# Patient Record
Sex: Male | Born: 1980 | Hispanic: No | Marital: Married | Smoking: Former smoker
Health system: Southern US, Community
[De-identification: ages and names within clinical notes are randomized; demographics above are authoritative.]

## PROBLEM LIST (undated history)

## (undated) DIAGNOSIS — E7800 Pure hypercholesterolemia, unspecified: Secondary | ICD-10-CM

## (undated) DIAGNOSIS — E790 Hyperuricemia without signs of inflammatory arthritis and tophaceous disease: Secondary | ICD-10-CM

## (undated) DIAGNOSIS — G43909 Migraine, unspecified, not intractable, without status migrainosus: Secondary | ICD-10-CM

## (undated) DIAGNOSIS — J189 Pneumonia, unspecified organism: Secondary | ICD-10-CM

## (undated) DIAGNOSIS — K219 Gastro-esophageal reflux disease without esophagitis: Secondary | ICD-10-CM

## (undated) DIAGNOSIS — E119 Type 2 diabetes mellitus without complications: Secondary | ICD-10-CM

## (undated) DIAGNOSIS — K859 Acute pancreatitis without necrosis or infection, unspecified: Secondary | ICD-10-CM

## (undated) DIAGNOSIS — I10 Essential (primary) hypertension: Secondary | ICD-10-CM

## (undated) DIAGNOSIS — C649 Malignant neoplasm of unspecified kidney, except renal pelvis: Secondary | ICD-10-CM

## (undated) HISTORY — PX: ADRENALECTOMY: SHX876

---

## 2009-03-31 DIAGNOSIS — C649 Malignant neoplasm of unspecified kidney, except renal pelvis: Secondary | ICD-10-CM

## 2009-03-31 HISTORY — PX: NEPHRECTOMY: SHX65

## 2009-03-31 HISTORY — DX: Malignant neoplasm of unspecified kidney, except renal pelvis: C64.9

## 2017-02-28 HISTORY — PX: UPPER GASTROINTESTINAL ENDOSCOPY: SHX188

## 2017-11-03 ENCOUNTER — Inpatient Hospital Stay (HOSPITAL_COMMUNITY)
Admission: EM | Admit: 2017-11-03 | Discharge: 2017-11-18 | DRG: 438 | Disposition: A | Discharge status: Home / Self Care | Payer: 59 | Attending: Internal Medicine | Admitting: Internal Medicine

## 2017-11-03 ENCOUNTER — Encounter (HOSPITAL_COMMUNITY): Payer: Self-pay | Admitting: Emergency Medicine

## 2017-11-03 DIAGNOSIS — D649 Anemia, unspecified: Secondary | ICD-10-CM | POA: Diagnosis present

## 2017-11-03 DIAGNOSIS — J189 Pneumonia, unspecified organism: Secondary | ICD-10-CM | POA: Diagnosis not present

## 2017-11-03 DIAGNOSIS — R7989 Other specified abnormal findings of blood chemistry: Secondary | ICD-10-CM

## 2017-11-03 DIAGNOSIS — Z79899 Other long term (current) drug therapy: Secondary | ICD-10-CM

## 2017-11-03 DIAGNOSIS — R001 Bradycardia, unspecified: Secondary | ICD-10-CM | POA: Diagnosis present

## 2017-11-03 DIAGNOSIS — Z905 Acquired absence of kidney: Secondary | ICD-10-CM

## 2017-11-03 DIAGNOSIS — N170 Acute kidney failure with tubular necrosis: Secondary | ICD-10-CM | POA: Diagnosis present

## 2017-11-03 DIAGNOSIS — E785 Hyperlipidemia, unspecified: Secondary | ICD-10-CM | POA: Diagnosis present

## 2017-11-03 DIAGNOSIS — Y95 Nosocomial condition: Secondary | ICD-10-CM | POA: Diagnosis not present

## 2017-11-03 DIAGNOSIS — K859 Acute pancreatitis without necrosis or infection, unspecified: Principal | ICD-10-CM | POA: Diagnosis present

## 2017-11-03 DIAGNOSIS — R197 Diarrhea, unspecified: Secondary | ICD-10-CM | POA: Diagnosis present

## 2017-11-03 DIAGNOSIS — Z85528 Personal history of other malignant neoplasm of kidney: Secondary | ICD-10-CM

## 2017-11-03 DIAGNOSIS — R509 Fever, unspecified: Secondary | ICD-10-CM

## 2017-11-03 DIAGNOSIS — G479 Sleep disorder, unspecified: Secondary | ICD-10-CM | POA: Diagnosis present

## 2017-11-03 DIAGNOSIS — R739 Hyperglycemia, unspecified: Secondary | ICD-10-CM | POA: Diagnosis present

## 2017-11-03 DIAGNOSIS — D6959 Other secondary thrombocytopenia: Secondary | ICD-10-CM | POA: Diagnosis present

## 2017-11-03 DIAGNOSIS — E781 Pure hyperglyceridemia: Secondary | ICD-10-CM | POA: Diagnosis present

## 2017-11-03 DIAGNOSIS — R140 Abdominal distension (gaseous): Secondary | ICD-10-CM | POA: Diagnosis not present

## 2017-11-03 DIAGNOSIS — R0902 Hypoxemia: Secondary | ICD-10-CM

## 2017-11-03 DIAGNOSIS — F1010 Alcohol abuse, uncomplicated: Secondary | ICD-10-CM | POA: Diagnosis present

## 2017-11-03 DIAGNOSIS — N179 Acute kidney failure, unspecified: Secondary | ICD-10-CM | POA: Diagnosis present

## 2017-11-03 DIAGNOSIS — E8809 Other disorders of plasma-protein metabolism, not elsewhere classified: Secondary | ICD-10-CM | POA: Diagnosis present

## 2017-11-03 DIAGNOSIS — E7800 Pure hypercholesterolemia, unspecified: Secondary | ICD-10-CM | POA: Diagnosis present

## 2017-11-03 DIAGNOSIS — E8770 Fluid overload, unspecified: Secondary | ICD-10-CM | POA: Diagnosis present

## 2017-11-03 DIAGNOSIS — R112 Nausea with vomiting, unspecified: Secondary | ICD-10-CM | POA: Diagnosis present

## 2017-11-03 DIAGNOSIS — K219 Gastro-esophageal reflux disease without esophagitis: Secondary | ICD-10-CM | POA: Diagnosis present

## 2017-11-03 DIAGNOSIS — E871 Hypo-osmolality and hyponatremia: Secondary | ICD-10-CM | POA: Diagnosis present

## 2017-11-03 DIAGNOSIS — E872 Acidosis: Secondary | ICD-10-CM | POA: Diagnosis present

## 2017-11-03 DIAGNOSIS — R945 Abnormal results of liver function studies: Secondary | ICD-10-CM

## 2017-11-03 DIAGNOSIS — E1165 Type 2 diabetes mellitus with hyperglycemia: Secondary | ICD-10-CM | POA: Diagnosis present

## 2017-11-03 DIAGNOSIS — D696 Thrombocytopenia, unspecified: Secondary | ICD-10-CM | POA: Diagnosis present

## 2017-11-03 DIAGNOSIS — I10 Essential (primary) hypertension: Secondary | ICD-10-CM | POA: Diagnosis present

## 2017-11-03 DIAGNOSIS — J90 Pleural effusion, not elsewhere classified: Secondary | ICD-10-CM | POA: Diagnosis present

## 2017-11-03 HISTORY — DX: Migraine, unspecified, not intractable, without status migrainosus: G43.909

## 2017-11-03 HISTORY — DX: Hyperuricemia without signs of inflammatory arthritis and tophaceous disease: E79.0

## 2017-11-03 HISTORY — DX: Essential (primary) hypertension: I10

## 2017-11-03 HISTORY — DX: Gastro-esophageal reflux disease without esophagitis: K21.9

## 2017-11-03 HISTORY — DX: Malignant neoplasm of unspecified kidney, except renal pelvis: C64.9

## 2017-11-03 HISTORY — DX: Pure hypercholesterolemia, unspecified: E78.00

## 2017-11-03 LAB — COMPREHENSIVE METABOLIC PANEL
ALT: 33 U/L (ref 0–44)
AST: 31 U/L (ref 15–41)
Albumin: 4.7 g/dL (ref 3.5–5.0)
Alkaline Phosphatase: 94 U/L (ref 38–126)
Anion gap: 13 (ref 5–15)
BUN: 12 mg/dL (ref 6–20)
CO2: 22 mmol/L (ref 22–32)
Calcium: 9.9 mg/dL (ref 8.9–10.3)
Chloride: 105 mmol/L (ref 98–111)
Creatinine, Ser: 1.53 mg/dL — ABNORMAL HIGH (ref 0.61–1.24)
GFR calc Af Amer: >60 mL/min (ref >60)
GFR calc non Af Amer: 57 mL/min — ABNORMAL LOW (ref >60)
Glucose, Bld: 142 mg/dL — ABNORMAL HIGH (ref 70–99)
Potassium: 4.5 mmol/L (ref 3.5–5.1)
Sodium: 140 mmol/L (ref 135–145)
Total Bilirubin: 1.4 mg/dL — ABNORMAL HIGH (ref 0.3–1.2)
Total Protein: 7.7 g/dL (ref 6.5–8.1)

## 2017-11-03 LAB — URINALYSIS, ROUTINE W REFLEX MICROSCOPIC
Bacteria, UA: NONE SEEN
Bilirubin Urine: NEGATIVE
Glucose, UA: NEGATIVE mg/dL
Hgb urine dipstick: NEGATIVE
Ketones, ur: NEGATIVE mg/dL
Leukocytes, UA: NEGATIVE
Nitrite: NEGATIVE
Protein, ur: 30 mg/dL — AB
Specific Gravity, Urine: 1.015 (ref 1.005–1.030)
pH: 5 (ref 5.0–8.0)

## 2017-11-03 LAB — CBC WITH DIFFERENTIAL/PLATELET
Abs Immature Granulocytes: 0.1 10*3/uL (ref 0.0–0.1)
Basophils Absolute: 0.1 10*3/uL (ref 0.0–0.1)
Basophils Relative: 0 %
Eosinophils Absolute: 0 10*3/uL (ref 0.0–0.7)
Eosinophils Relative: 0 %
HCT: 50.8 % (ref 39.0–52.0)
Hemoglobin: 16.8 g/dL (ref 13.0–17.0)
Immature Granulocytes: 0 %
Lymphocytes Relative: 7 %
Lymphs Abs: 1.3 10*3/uL (ref 0.7–4.0)
MCH: 28.6 pg (ref 26.0–34.0)
MCHC: 33.1 g/dL (ref 30.0–36.0)
MCV: 86.4 fL (ref 78.0–100.0)
Monocytes Absolute: 1.6 10*3/uL — ABNORMAL HIGH (ref 0.1–1.0)
Monocytes Relative: 9 %
Neutro Abs: 15.2 10*3/uL — ABNORMAL HIGH (ref 1.7–7.7)
Neutrophils Relative %: 84 %
Platelets: 259 10*3/uL (ref 150–400)
RBC: 5.88 MIL/uL — ABNORMAL HIGH (ref 4.22–5.81)
RDW: 13.2 % (ref 11.5–15.5)
WBC: 18.2 10*3/uL — ABNORMAL HIGH (ref 4.0–10.5)

## 2017-11-03 LAB — LIPASE, BLOOD: Lipase: 4842 U/L — ABNORMAL HIGH (ref 11–51)

## 2017-11-03 MED ORDER — SODIUM CHLORIDE 0.9 % IV BOLUS
1000.0000 mL | Freq: Once | INTRAVENOUS | Status: AC
  Administered 2017-11-04: 1000 mL via INTRAVENOUS

## 2017-11-03 MED ORDER — HYDROMORPHONE HCL 1 MG/ML IJ SOLN
1.0000 mg | Freq: Once | INTRAMUSCULAR | Status: AC
  Administered 2017-11-04: 1 mg via INTRAVENOUS
  Filled 2017-11-03: qty 1

## 2017-11-03 MED ORDER — ONDANSETRON HCL 4 MG/2ML IJ SOLN
4.0000 mg | Freq: Once | INTRAMUSCULAR | Status: AC
  Administered 2017-11-03: 4 mg via INTRAVENOUS
  Filled 2017-11-03: qty 2

## 2017-11-03 MED ORDER — DICYCLOMINE HCL 20 MG PO TABS
20.0000 mg | ORAL_TABLET | Freq: Once | ORAL | Status: AC
  Administered 2017-11-03: 20 mg via ORAL
  Filled 2017-11-03: qty 1

## 2017-11-03 NOTE — ED Provider Notes (Signed)
Patient placed in Quick Look pathway, seen and evaluated   Chief Complaint: Abdominal pain  HPI:   Sudden onset generalized crampy abdominal pain 10am after coffee. Worsening after lunch. Emesis x4 NBNB. Denies diarrhea, CP,SOB. Crampy pain, radiates to RUQ and RLQ.Denies urinary symptoms, no suspicious food intake.  Hx Kidney neoplasm s/p nephrectomy.   ROS: For abdominal cramping, nausea, vomiting Negative for fevers, diarrhea, constipation, urinary symptoms  Physical Exam:   Gen: No distress  Neuro: Awake and Alert  Skin: Warm    Focused Exam: Active bowel sounds, abdomen soft and nontender, Murphy sign absent, Rovsing's absent, no CVA tenderness   Initiation of care has begun. The patient has been counseled on the process, plan, and necessity for staying for the completion/evaluation, and the remainder of the medical screening examination    Debroah Baller 11/03/17 1924    Little, Wenda Overland, MD 11/04/17 [phone]

## 2017-11-03 NOTE — ED Notes (Signed)
Pt is unable to void at this time.  He has a specimen cup and instructions

## 2017-11-03 NOTE — ED Triage Notes (Signed)
Patient reports mid/upper abdominal " cramping/gas" with emesis x1 this morning , denies diarrhea , no fever or chills . Patient initially screened by PA at triage .

## 2017-11-04 ENCOUNTER — Emergency Department (HOSPITAL_COMMUNITY): Payer: 59

## 2017-11-04 ENCOUNTER — Encounter (HOSPITAL_COMMUNITY): Payer: Self-pay | Admitting: Internal Medicine

## 2017-11-04 ENCOUNTER — Other Ambulatory Visit: Payer: Self-pay

## 2017-11-04 DIAGNOSIS — E781 Pure hyperglyceridemia: Secondary | ICD-10-CM | POA: Diagnosis present

## 2017-11-04 DIAGNOSIS — Y95 Nosocomial condition: Secondary | ICD-10-CM | POA: Diagnosis not present

## 2017-11-04 DIAGNOSIS — Z85528 Personal history of other malignant neoplasm of kidney: Secondary | ICD-10-CM | POA: Diagnosis not present

## 2017-11-04 DIAGNOSIS — R0902 Hypoxemia: Secondary | ICD-10-CM | POA: Diagnosis present

## 2017-11-04 DIAGNOSIS — K858 Other acute pancreatitis without necrosis or infection: Secondary | ICD-10-CM | POA: Diagnosis not present

## 2017-11-04 DIAGNOSIS — N170 Acute kidney failure with tubular necrosis: Secondary | ICD-10-CM | POA: Diagnosis present

## 2017-11-04 DIAGNOSIS — I10 Essential (primary) hypertension: Secondary | ICD-10-CM | POA: Diagnosis present

## 2017-11-04 DIAGNOSIS — E8809 Other disorders of plasma-protein metabolism, not elsewhere classified: Secondary | ICD-10-CM | POA: Diagnosis present

## 2017-11-04 DIAGNOSIS — K8500 Idiopathic acute pancreatitis without necrosis or infection: Secondary | ICD-10-CM | POA: Diagnosis not present

## 2017-11-04 DIAGNOSIS — K219 Gastro-esophageal reflux disease without esophagitis: Secondary | ICD-10-CM | POA: Diagnosis present

## 2017-11-04 DIAGNOSIS — D72829 Elevated white blood cell count, unspecified: Secondary | ICD-10-CM | POA: Diagnosis not present

## 2017-11-04 DIAGNOSIS — D6959 Other secondary thrombocytopenia: Secondary | ICD-10-CM | POA: Diagnosis present

## 2017-11-04 DIAGNOSIS — R112 Nausea with vomiting, unspecified: Secondary | ICD-10-CM | POA: Diagnosis present

## 2017-11-04 DIAGNOSIS — Z905 Acquired absence of kidney: Secondary | ICD-10-CM | POA: Diagnosis not present

## 2017-11-04 DIAGNOSIS — R935 Abnormal findings on diagnostic imaging of other abdominal regions, including retroperitoneum: Secondary | ICD-10-CM | POA: Diagnosis not present

## 2017-11-04 DIAGNOSIS — R197 Diarrhea, unspecified: Secondary | ICD-10-CM | POA: Diagnosis present

## 2017-11-04 DIAGNOSIS — N171 Acute kidney failure with acute cortical necrosis: Secondary | ICD-10-CM | POA: Diagnosis not present

## 2017-11-04 DIAGNOSIS — D649 Anemia, unspecified: Secondary | ICD-10-CM | POA: Diagnosis present

## 2017-11-04 DIAGNOSIS — N179 Acute kidney failure, unspecified: Secondary | ICD-10-CM | POA: Diagnosis not present

## 2017-11-04 DIAGNOSIS — K859 Acute pancreatitis without necrosis or infection, unspecified: Secondary | ICD-10-CM | POA: Diagnosis present

## 2017-11-04 DIAGNOSIS — E785 Hyperlipidemia, unspecified: Secondary | ICD-10-CM | POA: Diagnosis present

## 2017-11-04 DIAGNOSIS — R945 Abnormal results of liver function studies: Secondary | ICD-10-CM | POA: Diagnosis not present

## 2017-11-04 DIAGNOSIS — E872 Acidosis: Secondary | ICD-10-CM | POA: Diagnosis present

## 2017-11-04 DIAGNOSIS — R001 Bradycardia, unspecified: Secondary | ICD-10-CM | POA: Diagnosis present

## 2017-11-04 DIAGNOSIS — R739 Hyperglycemia, unspecified: Secondary | ICD-10-CM | POA: Diagnosis present

## 2017-11-04 DIAGNOSIS — J189 Pneumonia, unspecified organism: Secondary | ICD-10-CM | POA: Diagnosis not present

## 2017-11-04 DIAGNOSIS — R5081 Fever presenting with conditions classified elsewhere: Secondary | ICD-10-CM | POA: Diagnosis not present

## 2017-11-04 DIAGNOSIS — E8770 Fluid overload, unspecified: Secondary | ICD-10-CM | POA: Diagnosis present

## 2017-11-04 DIAGNOSIS — R140 Abdominal distension (gaseous): Secondary | ICD-10-CM | POA: Diagnosis not present

## 2017-11-04 DIAGNOSIS — F1010 Alcohol abuse, uncomplicated: Secondary | ICD-10-CM | POA: Diagnosis present

## 2017-11-04 DIAGNOSIS — E7800 Pure hypercholesterolemia, unspecified: Secondary | ICD-10-CM | POA: Diagnosis present

## 2017-11-04 DIAGNOSIS — K8501 Idiopathic acute pancreatitis with uninfected necrosis: Secondary | ICD-10-CM | POA: Diagnosis not present

## 2017-11-04 DIAGNOSIS — D696 Thrombocytopenia, unspecified: Secondary | ICD-10-CM | POA: Diagnosis present

## 2017-11-04 DIAGNOSIS — K8502 Idiopathic acute pancreatitis with infected necrosis: Secondary | ICD-10-CM | POA: Diagnosis not present

## 2017-11-04 DIAGNOSIS — E871 Hypo-osmolality and hyponatremia: Secondary | ICD-10-CM | POA: Diagnosis present

## 2017-11-04 DIAGNOSIS — R509 Fever, unspecified: Secondary | ICD-10-CM | POA: Diagnosis not present

## 2017-11-04 DIAGNOSIS — J90 Pleural effusion, not elsewhere classified: Secondary | ICD-10-CM | POA: Diagnosis present

## 2017-11-04 LAB — CBC WITH DIFFERENTIAL/PLATELET
ABS IMMATURE GRANULOCYTES: 0.1 10*3/uL (ref 0.0–0.1)
BASOS ABS: 0 10*3/uL (ref 0.0–0.1)
Basophils Relative: 0 %
Eosinophils Absolute: 0 10*3/uL (ref 0.0–0.7)
Eosinophils Relative: 0 %
HCT: 48.9 % (ref 39.0–52.0)
HEMOGLOBIN: 16.3 g/dL (ref 13.0–17.0)
Immature Granulocytes: 0 %
LYMPHS PCT: 5 %
Lymphs Abs: 0.7 10*3/uL (ref 0.7–4.0)
MCH: 28.8 pg (ref 26.0–34.0)
MCHC: 33.3 g/dL (ref 30.0–36.0)
MCV: 86.4 fL (ref 78.0–100.0)
MONO ABS: 1.5 10*3/uL — AB (ref 0.1–1.0)
Monocytes Relative: 10 %
NEUTROS ABS: 13.1 10*3/uL — AB (ref 1.7–7.7)
Neutrophils Relative %: 85 %
Platelets: 204 10*3/uL (ref 150–400)
RBC: 5.66 MIL/uL (ref 4.22–5.81)
RDW: 13.2 % (ref 11.5–15.5)
WBC: 15.5 10*3/uL — ABNORMAL HIGH (ref 4.0–10.5)

## 2017-11-04 LAB — BASIC METABOLIC PANEL
ANION GAP: 14 (ref 5–15)
BUN: 14 mg/dL (ref 6–20)
CO2: 20 mmol/L — ABNORMAL LOW (ref 22–32)
Calcium: 8.9 mg/dL (ref 8.9–10.3)
Chloride: 103 mmol/L (ref 98–111)
Creatinine, Ser: 1.46 mg/dL — ABNORMAL HIGH (ref 0.61–1.24)
Glucose, Bld: 212 mg/dL — ABNORMAL HIGH (ref 70–99)
Potassium: 4.9 mmol/L (ref 3.5–5.1)
SODIUM: 137 mmol/L (ref 135–145)

## 2017-11-04 LAB — HEPATIC FUNCTION PANEL
ALT: 28 U/L (ref 0–44)
AST: 38 U/L (ref 15–41)
Albumin: 3.9 g/dL (ref 3.5–5.0)
Alkaline Phosphatase: 81 U/L (ref 38–126)
BILIRUBIN INDIRECT: 0.8 mg/dL (ref 0.3–0.9)
Bilirubin, Direct: 0.2 mg/dL (ref 0.0–0.2)
TOTAL PROTEIN: 6.7 g/dL (ref 6.5–8.1)
Total Bilirubin: 1 mg/dL (ref 0.3–1.2)

## 2017-11-04 LAB — HIV ANTIBODY (ROUTINE TESTING W REFLEX): HIV SCREEN 4TH GENERATION: NONREACTIVE

## 2017-11-04 LAB — TRIGLYCERIDES: TRIGLYCERIDES: 86 mg/dL (ref <150)

## 2017-11-04 LAB — C-REACTIVE PROTEIN: CRP: 19.4 mg/dL — ABNORMAL HIGH (ref <1.0)

## 2017-11-04 LAB — LACTIC ACID, PLASMA
LACTIC ACID, VENOUS: 4.7 mmol/L — AB (ref 0.5–1.9)
LACTIC ACID, VENOUS: 4.5 mmol/L — AB (ref 0.5–1.9)

## 2017-11-04 LAB — HEMOGLOBIN A1C
HEMOGLOBIN A1C: 5.5 % (ref 4.8–5.6)
MEAN PLASMA GLUCOSE: 111.15 mg/dL

## 2017-11-04 MED ORDER — MORPHINE SULFATE (PF) 2 MG/ML IV SOLN
2.0000 mg | INTRAVENOUS | Status: DC | PRN
  Administered 2017-11-04: 2 mg via INTRAVENOUS
  Filled 2017-11-04: qty 1

## 2017-11-04 MED ORDER — ACETAMINOPHEN 650 MG RE SUPP: 650.0000 mg | Freq: Four times a day (QID) | RECTAL | Status: DC | PRN

## 2017-11-04 MED ORDER — METOPROLOL TARTRATE 5 MG/5ML IV SOLN
5.0000 mg | Freq: Four times a day (QID) | INTRAVENOUS | Status: DC
  Administered 2017-11-04 – 2017-11-05 (×4): 5 mg via INTRAVENOUS
  Filled 2017-11-04 (×4): qty 5

## 2017-11-04 MED ORDER — HYDRALAZINE HCL 20 MG/ML IJ SOLN
5.0000 mg | INTRAMUSCULAR | Status: DC | PRN
  Administered 2017-11-04: 5 mg via INTRAVENOUS
  Filled 2017-11-04 (×2): qty 1

## 2017-11-04 MED ORDER — ACETAMINOPHEN 325 MG PO TABS
650.0000 mg | ORAL_TABLET | Freq: Four times a day (QID) | ORAL | Status: DC | PRN
  Administered 2017-11-09 – 2017-11-15 (×4): 650 mg via ORAL
  Filled 2017-11-04 (×5): qty 2

## 2017-11-04 MED ORDER — IOHEXOL 300 MG/ML  SOLN
80.0000 mL | Freq: Once | INTRAMUSCULAR | Status: AC | PRN
  Administered 2017-11-04: 80 mL via INTRAVENOUS

## 2017-11-04 MED ORDER — ONDANSETRON HCL 4 MG PO TABS
4.0000 mg | ORAL_TABLET | Freq: Four times a day (QID) | ORAL | Status: DC | PRN
  Administered 2017-11-06 – 2017-11-07 (×3): 4 mg via ORAL
  Filled 2017-11-04 (×4): qty 1

## 2017-11-04 MED ORDER — ATENOLOL 25 MG PO TABS: 25.0000 mg | ORAL_TABLET | Freq: Every day | ORAL | Status: DC

## 2017-11-04 MED ORDER — ONDANSETRON HCL 4 MG/2ML IJ SOLN
4.0000 mg | Freq: Four times a day (QID) | INTRAMUSCULAR | Status: DC | PRN
  Administered 2017-11-04 – 2017-11-07 (×10): 4 mg via INTRAVENOUS
  Filled 2017-11-04 (×13): qty 2

## 2017-11-04 MED ORDER — SODIUM CHLORIDE 0.9 % IV SOLN
INTRAVENOUS | Status: DC
  Administered 2017-11-04 – 2017-11-06 (×5): via INTRAVENOUS

## 2017-11-04 MED ORDER — HYDROMORPHONE HCL 1 MG/ML IJ SOLN
1.0000 mg | Freq: Once | INTRAMUSCULAR | Status: AC
  Administered 2017-11-04: 1 mg via INTRAVENOUS
  Filled 2017-11-04: qty 1

## 2017-11-04 MED ORDER — LACTATED RINGERS IV SOLN
INTRAVENOUS | Status: DC
  Administered 2017-11-04 (×2): via INTRAVENOUS

## 2017-11-04 MED ORDER — HYDROMORPHONE HCL 1 MG/ML IJ SOLN
1.0000 mg | INTRAMUSCULAR | Status: DC | PRN
  Administered 2017-11-04 – 2017-11-06 (×9): 1 mg via INTRAVENOUS
  Filled 2017-11-04 (×11): qty 1

## 2017-11-04 NOTE — ED Notes (Signed)
ED Provider at bedside. 

## 2017-11-04 NOTE — H&P (Signed)
History and Physical    Paul [name] IRW:[id number] DOB: 1980-08-09 DOA: 11/03/2017  PCP: Karleen Hampshire., MD  Patient coming from: Home.  Chief Complaint: Abdominal pain.  HPI: Paul [name] is a 37 y.o. male with history of hypertension, hypertriglyceridemia, history of renal cell carcinoma status post left-sided nephrectomy done almost 9 years ago in [place] presents to the ER with complaint of abdominal pain since yesterday morning.  Has been a multiple episode of nausea vomiting and some diarrhea.  Pain is mostly epigastric area.  Had last alcoholic drink 2 days ago.  States he only drinks on the weekends.  No recent medication changes.  ED Course: In the ER labs show elevated lipase around 4000 and CT abdomen shows features concerning for acute pancreatitis labs also show leukocytosis with acute renal failure.  Review of Systems: As per HPI, rest all negative.   Past Medical History:  Diagnosis Date  . Cancer [facility])     Past Surgical History:  Procedure Laterality Date  . KIDNEY SURGERY       reports that he has never smoked. He has never used smokeless tobacco. He reports that he drinks alcohol. He reports that he does not use drugs.  No Known Allergies  History reviewed. No pertinent family history.  Prior to Admission medications   Medication Sig Start Date End Date Taking? Authorizing Provider  allopurinol (ZYLOPRIM) 100 MG tablet Take 100 mg by mouth 2 (two) times daily. 10/07/17  Yes [provider]  amLODipine (NORVASC) 2.5 MG tablet Take 2.5 mg by mouth at bedtime.  10/07/17  Yes [provider]  atenolol (TENORMIN) 50 MG tablet Take 50 mg by mouth daily. 10/27/17  Yes [provider]  fenofibrate 160 MG tablet Take 160 mg by mouth daily. 10/16/17  Yes [provider]  RA KRILL OIL 500 MG CAPS Take 500 mg by mouth daily.   Yes [provider]  vitamin B-12 (CYANOCOBALAMIN) 1000 MCG tablet Take 1,000 mcg by mouth daily.   Yes  [provider]    Physical Exam: Vitals:   11/03/17 1921 11/03/17 2345 11/04/17 0045 11/04/17 0115  BP:  (!) 151/98 (!) 153/83 (!) 182/101  Pulse:  (!) 50 (!) 56 (!) 46  Resp:  16 (!) 24 (!) 25  Temp:      TempSrc:      SpO2:  98% 98% 98%  Weight: 89.8 kg (198 lb)     Height: 5\' 10"  (1.778 m)         Constitutional: Moderately built and nourished. Vitals:   11/03/17 1921 11/03/17 2345 11/04/17 0045 11/04/17 0115  BP:  (!) 151/98 (!) 153/83 (!) 182/101  Pulse:  (!) 50 (!) 56 (!) 46  Resp:  16 (!) 24 (!) 25  Temp:      TempSrc:      SpO2:  98% 98% 98%  Weight: 89.8 kg (198 lb)     Height: 5\' 10"  (1.778 m)      Eyes: Anicteric.  No pallor. ENMT: No discharge from the ears eyes nose or mouth. Neck: No mass or.  No neck rigidity. Respiratory: No rhonchi or crepitations. Cardiovascular: S1-S2 heard no murmurs appreciated. Abdomen: Epigastric tenderness no guarding or rigidity. Musculoskeletal: No edema.  No joint effusion. Skin: No rash Neurologic: Alert awake oriented to place and person.  Moves all extremities. Psychiatric: Appears normal per normal affect.   Labs on Admission: I have personally reviewed following labs and imaging studies  CBC: Recent  Labs  Lab 11/03/17 1927  WBC 18.2*  NEUTROABS 15.2*  HGB 16.8  HCT 50.8  MCV 86.4  PLT 983   Basic Metabolic Panel: Recent Labs  Lab 11/03/17 1927  NA 140  K 4.5  CL 105  CO2 22  GLUCOSE 142*  BUN 12  CREATININE 1.53*  CALCIUM 9.9   GFR: Estimated Creatinine Clearance: 75.2 mL/min (A) (by C-G formula based on SCr of 1.53 mg/dL (H)). Liver Function Tests: Recent Labs  Lab 11/03/17 1927  AST 31  ALT 33  ALKPHOS 94  BILITOT 1.4*  PROT 7.7  ALBUMIN 4.7   Recent Labs  Lab 11/03/17 1927  LIPASE 4,842*   No results for input(s): AMMONIA in the last 168 hours. Coagulation Profile: No results for input(s): INR, PROTIME in the last 168 hours. Cardiac Enzymes: No results for input(s):  CKTOTAL, CKMB, CKMBINDEX, TROPONINI in the last 168 hours. BNP (last 3 results) No results for input(s): PROBNP in the last 8760 hours. HbA1C: No results for input(s): HGBA1C in the last 72 hours. CBG: No results for input(s): GLUCAP in the last 168 hours. Lipid Profile: No results for input(s): CHOL, HDL, LDLCALC, TRIG, CHOLHDL, LDLDIRECT in the last 72 hours. Thyroid Function Tests: No results for input(s): TSH, T4TOTAL, FREET4, T3FREE, THYROIDAB in the last 72 hours. Anemia Panel: No results for input(s): VITAMINB12, FOLATE, FERRITIN, TIBC, IRON, RETICCTPCT in the last 72 hours. Urine analysis:    Component Value Date/Time   COLORURINE YELLOW 11/03/2017 1923   APPEARANCEUR CLEAR 11/03/2017 1923   LABSPEC 1.015 11/03/2017 1923   PHURINE 5.0 11/03/2017 1923   GLUCOSEU NEGATIVE 11/03/2017 [place] NEGATIVE 11/03/2017 [place] NEGATIVE 11/03/2017 [place] NEGATIVE 11/03/2017 1923   PROTEINUR 30 (A) 11/03/2017 1923   NITRITE NEGATIVE 11/03/2017 1923   LEUKOCYTESUR NEGATIVE 11/03/2017 1923   Sepsis Labs: @LABRCNTIP (procalcitonin:4,lacticidven:4) )No results found for this or any previous visit (from the past 240 hour(s)).   Radiological Exams on Admission: Ct Abdomen Pelvis W Contrast  Result Date: 11/04/2017 CLINICAL DATA:  Acute onset of severe generalized abdominal pain and nausea. EXAM: CT ABDOMEN AND PELVIS WITH CONTRAST TECHNIQUE: Multidetector CT imaging of the abdomen and pelvis was performed using the standard protocol following bolus administration of intravenous contrast. CONTRAST:  3mL OMNIPAQUE IOHEXOL 300 MG/ML  SOLN COMPARISON:  PET/CT performed 09/15/2016 FINDINGS: Lower chest: Minimal bibasilar atelectasis is noted. The visualized portions of the mediastinum are unremarkable. Hepatobiliary: The liver is unremarkable in appearance. The gallbladder is unremarkable in appearance. The common bile duct remains normal in caliber. Trace ascites is noted  about the liver. Pancreas: Diffuse soft tissue inflammation is noted about the entirety of the pancreas, compatible with acute pancreatitis. There is question of mild devascularization about the head and proximal body of the pancreas, difficult to fully characterize. No pseudocyst formation is identified at this time. Associated free fluid tracks about the upper abdomen and along the paracolic gutters bilaterally into the pelvis. Spleen: The spleen is unremarkable in appearance. Adrenals/Urinary Tract: The right adrenal gland is unremarkable in appearance. The patient is status post left-sided adrenalectomy and nephrectomy. The right kidney is unremarkable. There is no evidence of hydronephrosis. No renal or ureteral stones are identified. No perinephric stranding is seen. Stomach/Bowel: The stomach is unremarkable in appearance. The small bowel is within normal limits. The appendix is normal in caliber, without evidence of appendicitis. The colon is unremarkable in appearance. Vascular/Lymphatic: The abdominal aorta is unremarkable in appearance. The  inferior vena cava is grossly unremarkable. No retroperitoneal lymphadenopathy is seen. No pelvic sidewall lymphadenopathy is identified. Reproductive: The bladder is mildly distended and grossly unremarkable. The prostate remains normal in size. Other: No additional soft tissue abnormalities are seen. Musculoskeletal: No acute osseous abnormalities are identified. Scattered bone islands are noted within the proximal humerus bilaterally. The visualized musculature is unremarkable in appearance. IMPRESSION: 1. Diffuse soft tissue inflammation about the entirety of the pancreas, compatible with acute pancreatitis. There is question of mild devascularization about the head and proximal body of the pancreas, difficult to fully characterize. No pseudocyst formation identified at this time. 2. Associated free fluid tracks about the upper abdomen and along the paracolic  gutters bilaterally into the pelvis, with trace free fluid about the liver. Electronically Signed   By: Garald Balding M.D.   On: 11/04/2017 01:30      Assessment/Plan Principal Problem:   Acute pancreatitis Active Problems:   ARF (acute renal failure) (HCC)   Essential hypertension    1. Acute pancreatitis with intractable nausea and vomiting abdominal pain -patient is kept n.p.o. and on aggressive fluid hydration.  Because of pancreatitis and not clear patient states he drinks only on the weekends.  Otherwise we will check triglyceride levels.  No definite evidence of any gallstones.  CAT scan also shows devascularizing.  Please consult gastroenterology in the a.m.  Check lactate levels. 2. Leukocytosis likely reactive.  Follow CBC. 3. Hypertension -we will keep patient on PRN IV hydralazine.  Patient also takes beta-blocker but presently is mildly bradycardic.  If heart rate improves may need to have scheduled IV beta-blockers changed to p.o. once patient able to take orally. 4. Hyperglycemia -check hemoglobin A1c. 5. History of renal cell carcinoma status post left-sided nephrectomy. 6. Hypertriglyceridemia check triglyceride levels on fenofibrate.   DVT prophylaxis: SCDs. Code Status: Full code. Family Communication: Discussed with patient. Disposition Plan: Home. Consults called: None. Admission status: Inpatient.   Rise Patience MD [facility] Pager [phone].  If 7PM-7AM, please contact night-coverage [email or web address] Password [email or web address]  11/04/2017, 2:06 AM

## 2017-11-04 NOTE — Consult Note (Addendum)
Consultation  Referring Provider: Domenic Polite, MD       Primary Care Physician:  Karleen Hampshire., MD Primary Gastroenterologist: Ammie Ferrier        Reason for Consultation: Acute pancreatitis            HPI:   Paul [name] is a 37 y.o. male with a past medical history significant for hypertension, hyperlipidemia, GERD, history of renal cell carcinoma status post left nephrectomy who presented to the ED complaining of abdominal pain, bloating and emesis.  Patient reports that yesterday morning he had a donut and coffee and started experiencing epigastric pain as well as cramping.  Patient initially thought that pain was secondary to gas.  Later that day after lunch patient started experiencing worsening pain. Patient went home and subsequently had 2 more episodes of vomiting.  Patient decided to come to the ED for further evaluation. Patient denies any history of alcohol abuse, last drink was over the weekend.  In the ED patient was found to have significant inflammation of his pancreas with elevated lipase 4842.  Patient also had leukocytosis with WBC 18.2 and lactic acidosis 4.7.  AST ALT alk phos were normal and total bilirubin was mildly elevated. No prior episode of pancreatitis. No evidence of gallstone. Of note patient was recently started on fenofibrate with recent cholesterol panel showing mildly elevated triglyceride 160. Patient denies any history of alcohol abuse.  Past Medical History:  Diagnosis Date  . Cancer [facility])     Past Surgical History:  Procedure Laterality Date  . KIDNEY SURGERY      History reviewed. No pertinent family history.   Social History   Tobacco Use  . Smoking status: Never Smoker  . Smokeless tobacco: Never Used  Substance Use Topics  . Alcohol use: Yes  . Drug use: Never    Prior to Admission medications   Medication Sig Start Date End Date Taking? Authorizing Provider  allopurinol (ZYLOPRIM) 100 MG tablet Take 100 mg by mouth 2  (two) times daily. 10/07/17  Yes [provider]  amLODipine (NORVASC) 2.5 MG tablet Take 2.5 mg by mouth at bedtime.  10/07/17  Yes [provider]  atenolol (TENORMIN) 50 MG tablet Take 50 mg by mouth daily. 10/27/17  Yes [provider]  fenofibrate 160 MG tablet Take 160 mg by mouth daily. 10/16/17  Yes [provider]  RA KRILL OIL 500 MG CAPS Take 500 mg by mouth daily.   Yes [provider]  vitamin B-12 (CYANOCOBALAMIN) 1000 MCG tablet Take 1,000 mcg by mouth daily.   Yes [provider]    Current Facility-Administered Medications  Medication Dose Route Frequency Provider Last Rate Last Dose  . 0.9 %  sodium chloride infusion   Intravenous Continuous Domenic Polite, MD 125 mL/hr at 11/04/17 1246    . acetaminophen (TYLENOL) tablet 650 mg  650 mg Oral Q6H PRN Rise Patience, MD       Or  . acetaminophen (TYLENOL) suppository 650 mg  650 mg Rectal Q6H PRN Rise Patience, MD      . hydrALAZINE (APRESOLINE) injection 5 mg  5 mg Intravenous Q4H PRN Rise Patience, MD   5 mg at 11/04/17 0220  . HYDROmorphone (DILAUDID) injection 1 mg  1 mg Intravenous Q2H PRN Rise Patience, MD   1 mg at 11/04/17 2800  . metoprolol tartrate (LOPRESSOR) injection 5 mg  5 mg Intravenous Q6H Domenic Polite, MD   5  mg at 11/04/17 1245  . ondansetron (ZOFRAN) tablet 4 mg  4 mg Oral Q6H PRN Rise Patience, MD       Or  . ondansetron [facility]) injection 4 mg  4 mg Intravenous Q6H PRN Rise Patience, MD   4 mg at 11/04/17 0754    Allergies as of 11/03/2017  . (No Known Allergies)     Review of Systems:    As per HPI, otherwise negative    Physical Exam:  Vital signs in last 24 hours: Temp:  [97.7 F (36.5 C)-98.2 F (36.8 C)] 98.2 F (36.8 C) (08/07 1203) Pulse Rate:  [46-67] 67 (08/07 1203) Resp:  [16-27] 16 (08/07 0756) BP: (146-182)/(81-101) 175/99 (08/07 1203) SpO2:  [93 %-100 %] 97 % (08/07 1203) Weight:   [198 lb (89.8 kg)] 198 lb (89.8 kg) (08/06 1921)   General:   Pleasant , in NAD Head:  Normocephalic and atraumatic. Eyes:   No icterus.   Conjunctiva pink. Ears:  Normal auditory acuity. Neck:  Supple Lungs:  Respirations even and unlabored. Lungs clear to auscultation bilaterally.   No wheezes, crackles, or rhonchi.  Heart:  Regular rate and rhythm; no MRG Abdomen:  Soft, nondistended, mildly tender in the epigastric region. Normal bowel sounds. No appreciable masses or hepatomegaly.  Rectal:  Not performed.  Msk:  Symmetrical without gross deformities.  Extremities:  Without edema. Neurologic:  Alert and  oriented x4;  grossly normal neurologically. Skin:  Intact without significant lesions or rashes. Psych:  Alert and cooperative. Normal affect.  LAB RESULTS: Recent Labs    11/03/17 1927 11/04/17 0538  WBC 18.2* 15.5*  HGB 16.8 16.3  HCT 50.8 48.9  PLT 259 204   BMET Recent Labs    11/03/17 1927 11/04/17 0538  NA 140 137  K 4.5 4.9  CL 105 103  CO2 22 20*  GLUCOSE 142* 212*  BUN 12 14  CREATININE 1.53* 1.46*  CALCIUM 9.9 8.9   LFT Recent Labs    11/04/17 0538  PROT 6.7  ALBUMIN 3.9  AST 38  ALT 28  ALKPHOS 81  BILITOT 1.0  BILIDIR 0.2  IBILI 0.8   PT/INR No results for input(s): LABPROT, INR in the last 72 hours.  STUDIES: Ct Abdomen Pelvis W Contrast  Result Date: 11/04/2017 CLINICAL DATA:  Acute onset of severe generalized abdominal pain and nausea. EXAM: CT ABDOMEN AND PELVIS WITH CONTRAST TECHNIQUE: Multidetector CT imaging of the abdomen and pelvis was performed using the standard protocol following bolus administration of intravenous contrast. CONTRAST:  11m OMNIPAQUE IOHEXOL 300 MG/ML  SOLN COMPARISON:  PET/CT performed 09/15/2016 FINDINGS: Lower chest: Minimal bibasilar atelectasis is noted. The visualized portions of the mediastinum are unremarkable. Hepatobiliary: The liver is unremarkable in appearance. The gallbladder is unremarkable in  appearance. The common bile duct remains normal in caliber. Trace ascites is noted about the liver. Pancreas: Diffuse soft tissue inflammation is noted about the entirety of the pancreas, compatible with acute pancreatitis. There is question of mild devascularization about the head and proximal body of the pancreas, difficult to fully characterize. No pseudocyst formation is identified at this time. Associated free fluid tracks about the upper abdomen and along the paracolic gutters bilaterally into the pelvis. Spleen: The spleen is unremarkable in appearance. Adrenals/Urinary Tract: The right adrenal gland is unremarkable in appearance. The patient is status post left-sided adrenalectomy and nephrectomy. The right kidney is unremarkable. There is no evidence of hydronephrosis. No renal or ureteral stones are identified.  No perinephric stranding is seen. Stomach/Bowel: The stomach is unremarkable in appearance. The small bowel is within normal limits. The appendix is normal in caliber, without evidence of appendicitis. The colon is unremarkable in appearance. Vascular/Lymphatic: The abdominal aorta is unremarkable in appearance. The inferior vena cava is grossly unremarkable. No retroperitoneal lymphadenopathy is seen. No pelvic sidewall lymphadenopathy is identified. Reproductive: The bladder is mildly distended and grossly unremarkable. The prostate remains normal in size. Other: No additional soft tissue abnormalities are seen. Musculoskeletal: No acute osseous abnormalities are identified. Scattered bone islands are noted within the proximal humerus bilaterally. The visualized musculature is unremarkable in appearance. IMPRESSION: 1. Diffuse soft tissue inflammation about the entirety of the pancreas, compatible with acute pancreatitis. There is question of mild devascularization about the head and proximal body of the pancreas, difficult to fully characterize. No pseudocyst formation identified at this time. 2.  Associated free fluid tracks about the upper abdomen and along the paracolic gutters bilaterally into the pelvis, with trace free fluid about the liver. Electronically Signed   By: Garald Balding M.D.   On: 11/04/2017 01:30     PREVIOUS ENDOSCOPIES:            EGD 02/2017   Impression / Plan:   Patient is a 38 year old male who presented with epigastric pain bloating and found to have significantly elevated lipase and evidence of pancreatic inflammation consistent with acute pancreatitis.  No evidence of pancreatic cysts seen on imaging.  Patient was recently started on fenofibrate for mildly elevated triglycerides.  Has no history of alcohol use.  Low suspicion for drug-induced pancreatitis after evaluation of medication list.  Could consider infectious etiology as a cause of acute pancreatitis given elevated white blood cell count on admission as well as lactic acidosis.  There is no evidence of gallstones or biliary sludge which could also be causing obstruction of the pancreatic ampulla.  We will also add to our differential possible insect bite patient reports that he has been moving and working outside a lot recently though unlikely.  Lastly, patient also has a history of renal cell carcinoma would need to rule out possible tumor or genetic component making patient prone to developing autoimmune pancreatitis.  This episode is very likely to be idiopathic pancreatitis.  No evidence of biliary tree or liver dysfunction based on recent laboratory results.  --Clear liquid diet, will advance diet as tolerated --Fluid resuscitation --Pain control per primary team --Follow up on CRP, IgG4 --Given patient history of renal cell carcinoma in a young age, will consider possible genetic testing CFTR mutation.. in the outpatient setting. --Monitor CBC and kidney function, trend lactic acid   Thanks   LOS: 0 days   Abdoulaye Diallo  11/04/2017, 2:51 PM    Attending physician's note   I have taken an  interval history, reviewed the chart and examined the patient. I agree with Dr Carmelina Dane note, impression and recommendations.   37 year old with GERD, history of renal cell carcinoma status post left nephrectomy, HTN, HLD with first episode of pancreatitis of ? Etiology.  Could be idiopathic.  No alcohol, gallstones, abnormal liver function tests, trauma, family history of pancreatitis, cocaine use, use of medications associated with pancreatitis, TG normal hypercalcemia, intake of any herbal medications or previous history of pancreatitis. Plan: IV fluids, pain control, nausea control, check CRP and IgG4.  If recurrent attacks, would proceed with MRCP/EUS as an outpatient.  Trend labs.  Will follow along.   Carmell Austria, MD

## 2017-11-04 NOTE — Progress Notes (Signed)
Patient seen and examined, admitted earlier this morning by Dr.Kakrakandy -This is a 37 year old male with history of hypertension hypertriglyceridemia, history of renal cell carcinoma status post left nephrectomy in 2012 in [place] admitted overnight with acute pancreatitis, and mild AK I  1. Acute pancreatitis -Etiology not clear, no history of heavy EtOH use reports drinking couple of beers on weekends only, triglyceride level is within normal range on fenofibrate -No evidence of gallstones on CT and LFTs are unremarkable -Continue nothing by mouth, aggressive IV fluids -We'll request gastroenterology consult  2. Lactic acidosis -Clinically no evidence of sepsis, suspect lactated Ringer's is contributing, change this to normal saline, WBC improving, he is afebrile, monitor  3. Acute kidney injury -Baseline creatinine is 1.1 -Continue hydration, monitor  4. History of renal cell carcinoma, status post left-sided nephrectomy in 2012 -Follow-up with urology  5. Hypertensions/rebound tachycardia -Restart beta blocker will give this IV for now while nothing by mouth  Domenic Polite, MD

## 2017-11-04 NOTE — ED Notes (Signed)
Patient transported to CT 

## 2017-11-04 NOTE — ED Provider Notes (Signed)
[place] EMERGENCY DEPARTMENT Provider Note   CSN: [id number] Arrival date & time: 11/03/17  1833     History   Chief Complaint Chief Complaint  Patient presents with  . Abdominal Pain    HPI Paul [name] is a 37 y.o. male.  The history is provided by the patient and medical records.    37 year old male with history of left renal tumor status post nephrectomy in 2011, presenting to the ED for epigastric abdominal pain.  Reports this began earlier this morning and has been present throughout the day.  Ports a lot of cramping abdominal pain in his epigastrium.  Reports numerous episodes of nonbloody, nonbilious emesis, now to the point of dry heaving.  No diarrhea.  No fever or chills.  He denies any significant changes in diet, only new medication is fenofibrate started by his doctor.  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  Denies hx of gallstones.  Past Medical History:  Diagnosis Date  . Cancer [facility])          Home Medications    Prior to Admission medications   Not on File    Family History No family history on file.  Social History Social History   Tobacco Use  . Smoking status: Never Smoker  . Smokeless tobacco: Never Used  Substance Use Topics  . Alcohol use: Yes  . Drug use: Never     Allergies   Patient has no known allergies.   Review of Systems Review of Systems  Gastrointestinal: Positive for abdominal pain, nausea and vomiting.  All other systems reviewed and are negative.    Physical Exam Updated Vital Signs BP (!) 146/92 (BP Location: Right Arm)   Pulse (!) 46   Temp 97.7 F (36.5 C) (Oral)   Resp 18   Ht 5\' 10"  (1.778 m)   Wt 89.8 kg (198 lb)   SpO2 100%   BMI 28.41 kg/m   Physical Exam  Constitutional: He is oriented to person, place, and time. He appears well-developed and well-nourished.  Appears fatigued  HENT:  Head: Normocephalic and atraumatic.    Mouth/Throat: Oropharynx is clear and moist.  Dry mucous membranes  Eyes: Pupils are equal, round, and reactive to light. Conjunctivae and EOM are normal.  Neck: Normal range of motion.  Cardiovascular: Normal rate, regular rhythm and normal heart sounds.  Pulmonary/Chest: Effort normal and breath sounds normal.  Abdominal: Soft. Bowel sounds are normal. There is tenderness in the epigastric area. There is no rigidity and no guarding.  Musculoskeletal: Normal range of motion.  Neurological: He is alert and oriented to person, place, and time.  Skin: Skin is warm and dry.  Psychiatric: He has a normal mood and affect.  Nursing note and vitals reviewed.    ED Treatments / Results  Labs (all labs ordered are listed, but only abnormal results are displayed) Labs Reviewed  CBC WITH DIFFERENTIAL/PLATELET - Abnormal; Notable for the following components:      Result Value   WBC 18.2 (*)    RBC 5.88 (*)    Neutro Abs 15.2 (*)    Monocytes Absolute 1.6 (*)    All other components within normal limits  COMPREHENSIVE METABOLIC PANEL - Abnormal; Notable for the following components:   Glucose, Bld 142 (*)    Creatinine, Ser 1.53 (*)    Total Bilirubin 1.4 (*)    GFR calc non Af Amer 57 (*)  All other components within normal limits  LIPASE, BLOOD - Abnormal; Notable for the following components:   Lipase 4,842 (*)    All other components within normal limits  URINALYSIS, ROUTINE W REFLEX MICROSCOPIC - Abnormal; Notable for the following components:   Protein, ur 30 (*)    All other components within normal limits  HIV ANTIBODY (ROUTINE TESTING)  BASIC METABOLIC PANEL  HEPATIC FUNCTION PANEL  CBC WITH DIFFERENTIAL/PLATELET  TRIGLYCERIDES    EKG None  Radiology Ct Abdomen Pelvis W Contrast  Result Date: 11/04/2017 CLINICAL DATA:  Acute onset of severe generalized abdominal pain and nausea. EXAM: CT ABDOMEN AND PELVIS WITH CONTRAST TECHNIQUE: Multidetector CT imaging of the  abdomen and pelvis was performed using the standard protocol following bolus administration of intravenous contrast. CONTRAST:  105mL OMNIPAQUE IOHEXOL 300 MG/ML  SOLN COMPARISON:  PET/CT performed 09/15/2016 FINDINGS: Lower chest: Minimal bibasilar atelectasis is noted. The visualized portions of the mediastinum are unremarkable. Hepatobiliary: The liver is unremarkable in appearance. The gallbladder is unremarkable in appearance. The common bile duct remains normal in caliber. Trace ascites is noted about the liver. Pancreas: Diffuse soft tissue inflammation is noted about the entirety of the pancreas, compatible with acute pancreatitis. There is question of mild devascularization about the head and proximal body of the pancreas, difficult to fully characterize. No pseudocyst formation is identified at this time. Associated free fluid tracks about the upper abdomen and along the paracolic gutters bilaterally into the pelvis. Spleen: The spleen is unremarkable in appearance. Adrenals/Urinary Tract: The right adrenal gland is unremarkable in appearance. The patient is status post left-sided adrenalectomy and nephrectomy. The right kidney is unremarkable. There is no evidence of hydronephrosis. No renal or ureteral stones are identified. No perinephric stranding is seen. Stomach/Bowel: The stomach is unremarkable in appearance. The small bowel is within normal limits. The appendix is normal in caliber, without evidence of appendicitis. The colon is unremarkable in appearance. Vascular/Lymphatic: The abdominal aorta is unremarkable in appearance. The inferior vena cava is grossly unremarkable. No retroperitoneal lymphadenopathy is seen. No pelvic sidewall lymphadenopathy is identified. Reproductive: The bladder is mildly distended and grossly unremarkable. The prostate remains normal in size. Other: No additional soft tissue abnormalities are seen. Musculoskeletal: No acute osseous abnormalities are identified.  Scattered bone islands are noted within the proximal humerus bilaterally. The visualized musculature is unremarkable in appearance. IMPRESSION: 1. Diffuse soft tissue inflammation about the entirety of the pancreas, compatible with acute pancreatitis. There is question of mild devascularization about the head and proximal body of the pancreas, difficult to fully characterize. No pseudocyst formation identified at this time. 2. Associated free fluid tracks about the upper abdomen and along the paracolic gutters bilaterally into the pelvis, with trace free fluid about the liver. Electronically Signed   By: Garald Balding M.D.   On: 11/04/2017 01:30    Procedures Procedures (including critical care time)  Medications Ordered in ED Medications  HYDROmorphone (DILAUDID) injection 1 mg (has no administration in time range)  dicyclomine (BENTYL) tablet 20 mg (20 mg Oral Given 11/03/17 1933)  sodium chloride 0.9 % bolus 1,000 mL (1,000 mLs Intravenous New Bag/Given 11/04/17 0003)  HYDROmorphone (DILAUDID) injection 1 mg (1 mg Intravenous Given 11/04/17 0000)  ondansetron (ZOFRAN) injection 4 mg (4 mg Intravenous Given 11/03/17 2359)  iohexol (OMNIPAQUE) 300 MG/ML solution 80 mL (80 mLs Intravenous Contrast Given 11/04/17 0033)     Initial Impression / Assessment and Plan / ED Course  I have reviewed the triage vital  signs and the nursing notes.  Pertinent labs & imaging results that were available during my care of the patient were reviewed by me and considered in my medical decision making (see chart for details).  37 year old male presenting to the ED with abdominal pain.  Reports began this morning upon waking.  Mostly localized to the epigastrium with numerous episodes of nonbloody, nonbilious emesis.  Reports cramping pain at present.  He is afebrile and nontoxic but does appear uncomfortable and fatigued.  Labs with leukocytosis and significant elevation of lipase at 4000+.  Patient has no known history of  gallstones, no significant right upper quadrant pain.  Does report alcohol use over the weekend with friends, 3 drinks total.  Reports he only drinks socially.  Did recently start on fenofibrate prescribed by his primary care doctor.  We will plan for IV hydration, symptomatic management.  Patient does have history of left renal tumor status post nephrectomy, there is concern for possible malignancy.  Will obtain CT scan.  Patient with single kidney but reports his GFR is have been within normal limits recently.  His creatinine is slightly elevated today which likely has a component of dehydration given the amount of vomiting he has been experiencing.  He reports that his doctors have told him it is okay to have contrast and they have used it on his scans in the past.  CT scan with diffuse inflammation around the pancreas confirming acute pancreatitis.  No mass or other suspicious neoplasm noted.  Patient will be admitted for ongoing care.  Discussed with hospitalist service, Dr. Hal Hope who will admit.  Final Clinical Impressions(s) / ED Diagnoses   Final diagnoses:  Acute pancreatitis, unspecified complication status, unspecified pancreatitis type    ED Discharge Orders    None       Larene Pickett, PA-C 11/04/17 0406    Merryl Hacker, MD 11/04/17 [phone]

## 2017-11-04 NOTE — ED Notes (Signed)
Admitting MD Kakrakandy at bedside.  

## 2017-11-05 DIAGNOSIS — K8500 Idiopathic acute pancreatitis without necrosis or infection: Secondary | ICD-10-CM

## 2017-11-05 LAB — LIPASE, BLOOD: LIPASE: 2667 U/L — AB (ref 11–51)

## 2017-11-05 LAB — COMPREHENSIVE METABOLIC PANEL
ALBUMIN: 3.2 g/dL — AB (ref 3.5–5.0)
ALT: 27 U/L (ref 0–44)
ANION GAP: 14 (ref 5–15)
AST: 85 U/L — AB (ref 15–41)
Alkaline Phosphatase: 59 U/L (ref 38–126)
BILIRUBIN TOTAL: 1.8 mg/dL — AB (ref 0.3–1.2)
BUN: 21 mg/dL — AB (ref 6–20)
CO2: 19 mmol/L — ABNORMAL LOW (ref 22–32)
Calcium: 7.5 mg/dL — ABNORMAL LOW (ref 8.9–10.3)
Chloride: 101 mmol/L (ref 98–111)
Creatinine, Ser: 1.5 mg/dL — ABNORMAL HIGH (ref 0.61–1.24)
GFR calc Af Amer: >60 mL/min (ref >60)
GFR, EST NON AFRICAN AMERICAN: 58 mL/min — AB (ref >60)
GLUCOSE: 262 mg/dL — AB (ref 70–99)
Potassium: 4.2 mmol/L (ref 3.5–5.1)
Sodium: 134 mmol/L — ABNORMAL LOW (ref 135–145)
TOTAL PROTEIN: 5.5 g/dL — AB (ref 6.5–8.1)

## 2017-11-05 LAB — CBC
HEMATOCRIT: 42.5 % (ref 39.0–52.0)
HEMOGLOBIN: 14.1 g/dL (ref 13.0–17.0)
MCH: 28.8 pg (ref 26.0–34.0)
MCHC: 33.2 g/dL (ref 30.0–36.0)
MCV: 86.7 fL (ref 78.0–100.0)
Platelets: 161 10*3/uL (ref 150–400)
RBC: 4.9 MIL/uL (ref 4.22–5.81)
RDW: 13.5 % (ref 11.5–15.5)
WBC: 13.7 10*3/uL — ABNORMAL HIGH (ref 4.0–10.5)

## 2017-11-05 LAB — IGG 4: IGG 4: 4 mg/dL (ref 2–96)

## 2017-11-05 MED ORDER — GI COCKTAIL ~~LOC~~
30.0000 mL | Freq: Three times a day (TID) | ORAL | Status: DC | PRN
  Administered 2017-11-05 – 2017-11-09 (×5): 30 mL via ORAL
  Filled 2017-11-05 (×7): qty 30

## 2017-11-05 MED ORDER — ATENOLOL 25 MG PO TABS
50.0000 mg | ORAL_TABLET | Freq: Every day | ORAL | Status: DC
  Administered 2017-11-05 – 2017-11-18 (×14): 50 mg via ORAL
  Filled 2017-11-05 (×14): qty 2

## 2017-11-05 MED ORDER — PANTOPRAZOLE SODIUM 40 MG PO TBEC
40.0000 mg | DELAYED_RELEASE_TABLET | Freq: Every day | ORAL | Status: DC
  Administered 2017-11-05 – 2017-11-09 (×5): 40 mg via ORAL
  Filled 2017-11-05 (×5): qty 1

## 2017-11-05 MED ORDER — ENOXAPARIN SODIUM 40 MG/0.4ML ~~LOC~~ SOLN
40.0000 mg | SUBCUTANEOUS | Status: DC
  Administered 2017-11-05 – 2017-11-07 (×3): 40 mg via SUBCUTANEOUS
  Filled 2017-11-05 (×3): qty 0.4

## 2017-11-05 NOTE — Progress Notes (Signed)
Inpatient Diabetes Program Recommendations  AACE/ADA: New Consensus Statement on Inpatient Glycemic Control (2015)  Target Ranges:  Prepandial:   less than 140 mg/dL      Peak postprandial:   less than 180 mg/dL (1-2 hours)      Critically ill patients:  140 - 180 mg/dL   Lab Results  Component Value Date   HGBA1C 5.5 11/04/2017    Review of Glycemic Control Results for Paul [name], Paul [name] (MRN [id number]) as of 11/05/2017 10:26  Ref. Range 11/05/2017 05:18  Glucose Latest Ref Range: 70 - 99 mg/dL 262 (H)   Diabetes history: none Outpatient Diabetes medications: none Current orders for Inpatient glycemic control: none  Inpatient Diabetes Program Recommendations:    Consider adding Novolog 0-9 units TID under glycemic control order set and obtaining CBGs TID+HS.   Thanks, Bronson Curb, MSN, RNC-OB Diabetes Coordinator [phone] (8a-5p)

## 2017-11-05 NOTE — Progress Notes (Signed)
Called into patients room by wife who stated the patient felt short of breath.  SpO2 on patient was 83..placed pt on 2 liters of oxygen via nasal canula and pt stats climbed back to 100%.  Notified on call will continue to monitor pt .the patient placed on continues pulse ox

## 2017-11-05 NOTE — Progress Notes (Addendum)
PROGRESS NOTE    Paul [name]  QPR:[id number] DOB: 1980/07/30 DOA: 11/03/2017 PCP: Karleen Hampshire., MD  Brief Narrative: 37 year old male with history of hypertension hypertriglyceridemia, history of renal cell carcinoma status post left nephrectomy in 2012 in [place] admitted overnight with acute pancreatitis, and mild AK I  Assessment & Plan:   1. Acute pancreatitis -Etiology not clear, ?Idiopathic -no history of heavy EtOH use reports drinking couple of beers on weekends only, triglyceride level is within normal range on fenofibrate, No evidence of gallstones on CT and LFTs are unremarkable -Continue clears, aggressive IV fluids -appreciate gastroenterology consult -IgG4 is within normal limits, CRP elevated -If recurrent episodes GI recommends MRCP/EUS as outpatient  2. Acute kidney injury -Baseline creatinine is 1.1 -Continue hydration, monitor  3. History of renal cell carcinoma, status post left-sided nephrectomy in 2012 -Follow-up with urology  4. Hypertension/rebound tachycardia -resume home dose of atenolol, discontinue IV Lopressor  DVT prophylaxis:add lovenox Code Status: for code Family Communication:no family at bedside Disposition Plan: home in 1-2 days pending clinical improvement  Consultants:  gastroenterology  Procedures:   Antimicrobials:    Subjective: -continues to have nausea and upper abdominal and left-sided discomfort -No vomiting  Objective: Vitals:   11/05/17 0359 11/05/17 0500 11/05/17 0828 11/05/17 1258  BP: (!) 163/91  (!) 143/97 (!) 144/98  Pulse: (!) 131  (!) 102 87  Resp: 18  18 18   Temp: 98.2 F (36.8 C)  98.1 F (36.7 C) 98.7 F (37.1 C)  TempSrc: Oral  Oral Oral  SpO2: 95%  92% 92%  Weight:  90 kg    Height:        Intake/Output Summary (Last 24 hours) at 11/05/2017 1350 Last data filed at 11/05/2017 0900 Gross per 24 hour  Intake 1240 ml  Output -  Net 1240 ml   Filed Weights   11/03/17 1921 11/05/17 0500  Weight:  89.8 kg 90 kg    Examination:  General exam:  ill-appearing but in no distress Respiratory system: decreased breath sounds at both bases Cardiovascular system: S1 & S2 heard, tachycardic Gastrointestinal system: Abdomen is Soft, mildly tender in the epigastrium and left upper quadrant, nondistended, bowel sounds present Central nervous system: Alert and oriented. No focal neurological deficits. Extremities: Symmetric 5 x 5 power. Skin: No rashes, lesions or ulcers Psychiatry: Judgement and insight appear normal. Mood & affect appropriate.     Data Reviewed:   CBC: Recent Labs  Lab 11/03/17 1927 11/04/17 0538 11/05/17 0518  WBC 18.2* 15.5* 13.7*  NEUTROABS 15.2* 13.1*  --   HGB 16.8 16.3 14.1  HCT 50.8 48.9 42.5  MCV 86.4 86.4 86.7  PLT 259 204 993   Basic Metabolic Panel: Recent Labs  Lab 11/03/17 1927 11/04/17 0538 11/05/17 0518  NA 140 137 134*  K 4.5 4.9 4.2  CL 105 103 101  CO2 22 20* 19*  GLUCOSE 142* 212* 262*  BUN 12 14 21*  CREATININE 1.53* 1.46* 1.50*  CALCIUM 9.9 8.9 7.5*   GFR: Estimated Creatinine Clearance: 76.8 mL/min (A) (by C-G formula based on SCr of 1.5 mg/dL (H)). Liver Function Tests: Recent Labs  Lab 11/03/17 1927 11/04/17 0538 11/05/17 0518  AST 31 38 85*  ALT 33 28 27  ALKPHOS 94 81 59  BILITOT 1.4* 1.0 1.8*  PROT 7.7 6.7 5.5*  ALBUMIN 4.7 3.9 3.2*   Recent Labs  Lab 11/03/17 1927 11/05/17 0518  LIPASE 4,842* 2,667*   No results for input(s): AMMONIA in the last  168 hours. Coagulation Profile: No results for input(s): INR, PROTIME in the last 168 hours. Cardiac Enzymes: No results for input(s): CKTOTAL, CKMB, CKMBINDEX, TROPONINI in the last 168 hours. BNP (last 3 results) No results for input(s): PROBNP in the last 8760 hours. HbA1C: Recent Labs    11/04/17 0744  HGBA1C 5.5   CBG: No results for input(s): GLUCAP in the last 168 hours. Lipid Profile: Recent Labs    11/04/17 0538  TRIG 86   Thyroid Function  Tests: No results for input(s): TSH, T4TOTAL, FREET4, T3FREE, THYROIDAB in the last 72 hours. Anemia Panel: No results for input(s): VITAMINB12, FOLATE, FERRITIN, TIBC, IRON, RETICCTPCT in the last 72 hours. Urine analysis:    Component Value Date/Time   COLORURINE YELLOW 11/03/2017 1923   APPEARANCEUR CLEAR 11/03/2017 1923   LABSPEC 1.015 11/03/2017 1923   PHURINE 5.0 11/03/2017 1923   GLUCOSEU NEGATIVE 11/03/2017 [place] NEGATIVE 11/03/2017 [place] NEGATIVE 11/03/2017 [place] NEGATIVE 11/03/2017 1923   PROTEINUR 30 (A) 11/03/2017 1923   NITRITE NEGATIVE 11/03/2017 1923   LEUKOCYTESUR NEGATIVE 11/03/2017 1923   Sepsis Labs: @LABRCNTIP (procalcitonin:4,lacticidven:4)  )No results found for this or any previous visit (from the past 240 hour(s)).       Radiology Studies: Ct Abdomen Pelvis W Contrast  Result Date: 11/04/2017 CLINICAL DATA:  Acute onset of severe generalized abdominal pain and nausea. EXAM: CT ABDOMEN AND PELVIS WITH CONTRAST TECHNIQUE: Multidetector CT imaging of the abdomen and pelvis was performed using the standard protocol following bolus administration of intravenous contrast. CONTRAST:  12mL OMNIPAQUE IOHEXOL 300 MG/ML  SOLN COMPARISON:  PET/CT performed 09/15/2016 FINDINGS: Lower chest: Minimal bibasilar atelectasis is noted. The visualized portions of the mediastinum are unremarkable. Hepatobiliary: The liver is unremarkable in appearance. The gallbladder is unremarkable in appearance. The common bile duct remains normal in caliber. Trace ascites is noted about the liver. Pancreas: Diffuse soft tissue inflammation is noted about the entirety of the pancreas, compatible with acute pancreatitis. There is question of mild devascularization about the head and proximal body of the pancreas, difficult to fully characterize. No pseudocyst formation is identified at this time. Associated free fluid tracks about the upper abdomen and along the  paracolic gutters bilaterally into the pelvis. Spleen: The spleen is unremarkable in appearance. Adrenals/Urinary Tract: The right adrenal gland is unremarkable in appearance. The patient is status post left-sided adrenalectomy and nephrectomy. The right kidney is unremarkable. There is no evidence of hydronephrosis. No renal or ureteral stones are identified. No perinephric stranding is seen. Stomach/Bowel: The stomach is unremarkable in appearance. The small bowel is within normal limits. The appendix is normal in caliber, without evidence of appendicitis. The colon is unremarkable in appearance. Vascular/Lymphatic: The abdominal aorta is unremarkable in appearance. The inferior vena cava is grossly unremarkable. No retroperitoneal lymphadenopathy is seen. No pelvic sidewall lymphadenopathy is identified. Reproductive: The bladder is mildly distended and grossly unremarkable. The prostate remains normal in size. Other: No additional soft tissue abnormalities are seen. Musculoskeletal: No acute osseous abnormalities are identified. Scattered bone islands are noted within the proximal humerus bilaterally. The visualized musculature is unremarkable in appearance. IMPRESSION: 1. Diffuse soft tissue inflammation about the entirety of the pancreas, compatible with acute pancreatitis. There is question of mild devascularization about the head and proximal body of the pancreas, difficult to fully characterize. No pseudocyst formation identified at this time. 2. Associated free fluid tracks about the upper abdomen and along the paracolic gutters bilaterally into the  pelvis, with trace free fluid about the liver. Electronically Signed   By: Garald Balding M.D.   On: 11/04/2017 01:30        Scheduled Meds: . atenolol  50 mg Oral Daily  . pantoprazole  40 mg Oral Daily   Continuous Infusions: . sodium chloride 125 mL/hr at 11/05/17 0851     LOS: 1 day    Time spent: 83min    Domenic Polite, MD [facility] Page via [email or web address], password [email or web address] After 7PM please contact night-coverage  11/05/2017, 1:50 PM

## 2017-11-05 NOTE — Progress Notes (Addendum)
[facility] Progress Note   Chief Complaint:  pancreatitis     SUBJECTIVE:    no abdominal pain today but has taken a couple of dose of dilaudid. Just some mild discomfort when lying on his side. Some nausea if doesn't take anti-emetics on regular basis. To try clear liquids , tray at bedside.   ASSESSMENT AND PLAN:    37 yo male admitted with pancreatitis, first episode and of unclear etiology. CRP 19.4. 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  -He denies pain today but has taken Dilaudid a couple of times so must have had some discomfort. I think he is a little hesitant to complain. Overall he does seem to be improving.  -Let's see how he does with clears.  -continue anti-emetics.  -continue supportive care. If he has recurrent episode then will need MRCP/EUS   Attending physician's note   I have taken an interval history, reviewed the chart and examined the patient. I agree with the Advanced Practitioner's note, impression and recommendations.   37 year old with GERD, history of renal cell carcinoma status post left nephrectomy, HTN, HLD with first episode of pancreatitis of ? Etiology.  Could be idiopathic. Normal IgG-4. Plan: continue clear liquids. Advance to full liquids and a.m. Supportive care. Trend lipase (coming down). Add Protonix, GI cocktail PRN for reflux/esophageal hypersensitivity. If recurrent pancreatitis, need MRCP/EUS.  Carmell Austria, MD   OBJECTIVE:     Vital signs in last 24 hours: Temp:  [98.1 F (36.7 C)-99.4 F (37.4 C)] 98.1 F (36.7 C) (08/08 0828) Pulse Rate:  [67-131] 102 (08/08 0828) Resp:  [16-18] 18 (08/08 0828) BP: (134-175)/(91-104) 143/97 (08/08 0828) SpO2:  [92 %-98 %] 92 % (08/08 0828) Weight:  [90 kg] 90 kg (08/08 0500)   General:   Alert, well-developed, male in NAD EENT:  Normal hearing, non icteric sclera, conjunctive pink.  Heart:  Regular rate and rhythm; no murmurs. No lower extremity edema Pulm:  Normal respiratory effort, lungs CTA bilaterally without wheezes or crackles. Abdomen:  Soft, nondistended, nontender.  Normal bowel sounds, no masses felt.     Neurologic:  Alert and  oriented x4;  grossly normal neurologically. Psych:  Pleasant, cooperative.  Normal mood and affect.   Intake/Output from previous day: 08/07 0701 - 08/08 0700 In: 1000 [I.V.:1000] Out: -  Intake/Output this shift: Total I/O In: 240 [P.O.:240] Out: -   Lab Results: Recent Labs    11/03/17 1927 11/04/17 0538 11/05/17 0518  WBC 18.2* 15.5* 13.7*  HGB 16.8 16.3 14.1  HCT 50.8 48.9 42.5  PLT 259 204 161   BMET Recent Labs    11/03/17 1927 11/04/17 0538 11/05/17 0518  NA 140 137 134*  K 4.5 4.9 4.2  CL 105 103 101  CO2 22 20* 19*  GLUCOSE 142* 212* 262*  BUN 12 14 21*  CREATININE 1.53* 1.46* 1.50*  CALCIUM 9.9 8.9 7.5*   LFT Recent Labs    11/04/17 0538 11/05/17 0518  PROT 6.7 5.5*  ALBUMIN 3.9 3.2*  AST 38 85*  ALT 28 27  ALKPHOS 81 59  BILITOT 1.0 1.8*  BILIDIR 0.2  --   IBILI 0.8  --      Ct Abdomen Pelvis W Contrast  Result Date: 11/04/2017 CLINICAL DATA:  Acute onset of severe generalized abdominal pain and nausea. EXAM: CT ABDOMEN AND PELVIS WITH CONTRAST TECHNIQUE: Multidetector CT imaging of the abdomen and pelvis was performed using the standard protocol following bolus administration  of intravenous contrast. CONTRAST:  75mL OMNIPAQUE IOHEXOL 300 MG/ML  SOLN COMPARISON:  PET/CT performed 09/15/2016 FINDINGS: Lower chest: Minimal bibasilar atelectasis is noted. The visualized portions of the mediastinum are unremarkable. Hepatobiliary: The liver is unremarkable in appearance. The gallbladder is unremarkable in appearance. The common bile duct remains normal in caliber. Trace ascites is noted about the liver. Pancreas: Diffuse soft tissue inflammation is noted about the entirety of the pancreas, compatible with acute pancreatitis. There is question of mild devascularization  about the head and proximal body of the pancreas, difficult to fully characterize. No pseudocyst formation is identified at this time. Associated free fluid tracks about the upper abdomen and along the paracolic gutters bilaterally into the pelvis. Spleen: The spleen is unremarkable in appearance. Adrenals/Urinary Tract: The right adrenal gland is unremarkable in appearance. The patient is status post left-sided adrenalectomy and nephrectomy. The right kidney is unremarkable. There is no evidence of hydronephrosis. No renal or ureteral stones are identified. No perinephric stranding is seen. Stomach/Bowel: The stomach is unremarkable in appearance. The small bowel is within normal limits. The appendix is normal in caliber, without evidence of appendicitis. The colon is unremarkable in appearance. Vascular/Lymphatic: The abdominal aorta is unremarkable in appearance. The inferior vena cava is grossly unremarkable. No retroperitoneal lymphadenopathy is seen. No pelvic sidewall lymphadenopathy is identified. Reproductive: The bladder is mildly distended and grossly unremarkable. The prostate remains normal in size. Other: No additional soft tissue abnormalities are seen. Musculoskeletal: No acute osseous abnormalities are identified. Scattered bone islands are noted within the proximal humerus bilaterally. The visualized musculature is unremarkable in appearance. IMPRESSION: 1. Diffuse soft tissue inflammation about the entirety of the pancreas, compatible with acute pancreatitis. There is question of mild devascularization about the head and proximal body of the pancreas, difficult to fully characterize. No pseudocyst formation identified at this time. 2. Associated free fluid tracks about the upper abdomen and along the paracolic gutters bilaterally into the pelvis, with trace free fluid about the liver. Electronically Signed   By: Garald Balding M.D.   On: 11/04/2017 01:30     Principal Problem:   Acute  pancreatitis Active Problems:   ARF (acute renal failure) ([place])   Essential hypertension     LOS: 1 day   Tye Savoy ,NP 11/05/2017, 11:17 AM

## 2017-11-06 LAB — COMPREHENSIVE METABOLIC PANEL
ALT: 27 U/L (ref 0–44)
AST: 88 U/L — AB (ref 15–41)
Albumin: 2.9 g/dL — ABNORMAL LOW (ref 3.5–5.0)
Alkaline Phosphatase: 62 U/L (ref 38–126)
Anion gap: 14 (ref 5–15)
BUN: 24 mg/dL — AB (ref 6–20)
CHLORIDE: 98 mmol/L (ref 98–111)
CO2: 20 mmol/L — ABNORMAL LOW (ref 22–32)
CREATININE: 1.66 mg/dL — AB (ref 0.61–1.24)
Calcium: 6.8 mg/dL — ABNORMAL LOW (ref 8.9–10.3)
GFR, EST AFRICAN AMERICAN: 60 mL/min — AB (ref >60)
GFR, EST NON AFRICAN AMERICAN: 52 mL/min — AB (ref >60)
Glucose, Bld: 274 mg/dL — ABNORMAL HIGH (ref 70–99)
Potassium: 4.4 mmol/L (ref 3.5–5.1)
Sodium: 132 mmol/L — ABNORMAL LOW (ref 135–145)
Total Bilirubin: 1.7 mg/dL — ABNORMAL HIGH (ref 0.3–1.2)
Total Protein: 5.6 g/dL — ABNORMAL LOW (ref 6.5–8.1)

## 2017-11-06 LAB — CBC
HCT: 37.9 % — ABNORMAL LOW (ref 39.0–52.0)
HEMOGLOBIN: 12.5 g/dL — AB (ref 13.0–17.0)
MCH: 29.1 pg (ref 26.0–34.0)
MCHC: 33 g/dL (ref 30.0–36.0)
MCV: 88.1 fL (ref 78.0–100.0)
Platelets: 124 10*3/uL — ABNORMAL LOW (ref 150–400)
RBC: 4.3 MIL/uL (ref 4.22–5.81)
RDW: 13.5 % (ref 11.5–15.5)
WBC: 12.4 10*3/uL — AB (ref 4.0–10.5)

## 2017-11-06 LAB — LIPASE, BLOOD: LIPASE: 1685 U/L — AB (ref 11–51)

## 2017-11-06 MED ORDER — ZOLPIDEM TARTRATE 5 MG PO TABS
5.0000 mg | ORAL_TABLET | Freq: Every day | ORAL | Status: DC
  Administered 2017-11-06 – 2017-11-11 (×2): 5 mg via ORAL
  Filled 2017-11-06 (×5): qty 1

## 2017-11-06 MED ORDER — POTASSIUM CHLORIDE CRYS ER 20 MEQ PO TBCR
40.0000 meq | EXTENDED_RELEASE_TABLET | Freq: Once | ORAL | Status: AC
  Administered 2017-11-06: 40 meq via ORAL
  Filled 2017-11-06: qty 2

## 2017-11-06 MED ORDER — HYDROCODONE-ACETAMINOPHEN 5-325 MG PO TABS
1.0000 | ORAL_TABLET | Freq: Four times a day (QID) | ORAL | Status: DC | PRN
  Administered 2017-11-06 – 2017-11-11 (×8): 1 via ORAL
  Filled 2017-11-06 (×11): qty 1

## 2017-11-06 MED ORDER — FUROSEMIDE 10 MG/ML IJ SOLN
20.0000 mg | Freq: Once | INTRAMUSCULAR | Status: AC
  Administered 2017-11-06: 20 mg via INTRAVENOUS
  Filled 2017-11-06: qty 4

## 2017-11-06 MED ORDER — SENNOSIDES-DOCUSATE SODIUM 8.6-50 MG PO TABS
1.0000 | ORAL_TABLET | Freq: Two times a day (BID) | ORAL | Status: DC
  Administered 2017-11-06 – 2017-11-07 (×3): 1 via ORAL
  Filled 2017-11-06 (×5): qty 1

## 2017-11-06 NOTE — Progress Notes (Addendum)
[facility] Progress Note   Chief Complaint:   pancreatitis   SUBJECTIVE:   Says he has abdominal pain but only with moving around, laying on side.    ASSESSMENT AND PLAN:   37 yo male admitted with pancreatitis, first episode and of unclear etiology. CRP 19.4. 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  -patient says he isn't having much abdominal pain but has gott pain meds this am, I think he minimizes sx to some degree -he received aggressive fluid resuscitation. Lipase 1600  Today. WBC down to 12.4.  Afebrile. Total bili is up some to 1.7, alk phos, transaminases normal.  Became short of breath last night.  IVF stopped, 02 placed, lasix given this am -continue to monitor closely  AKI, not improving. Cr 1.66 << 1.5. Unfortunately IVF needed to be held.   Shortness of breath. Started last night. Probably volume overload from aggressive fluids. 02 sats okay on 02. Resp slightly increased while I was in room.  No chest pain. -f doesn't improve with lasix consider imaging of chest    Attending physician's note   I have taken an interval history, reviewed the chart and examined the patient. I agree with the Advanced Practitioner's note, impression and recommendations.   37 year old with acute pancreatitis of unclear etiology with mild AKI, shortness of breath.  Being diuresed gradually.  Clinically improving gradually.  Abdominal pain has resolved more or less.  Plan: Continue supportive care.  Agree with diuresis.  Once the creatinine improves and if he does not improve, would recommend repeating CT scan.  Trend liver function tests, CBC.  Will follow along.  Carmell Austria, MD  OBJECTIVE:     Vital signs in last 24 hours: Temp:  [98 F (36.7 C)-100 F (37.8 C)] 98.9 F (37.2 C) (08/09 1156) Pulse Rate:  [83-106] 83 (08/09 1156) Resp:  [18] 18 (08/09 1156) BP: (134-159)/(95-101) 150/96 (08/09 1156) SpO2:  [83 %-100 %] 94 % (08/09 1156) Weight:   [90.7 kg] 90.7 kg (08/09 0500) Last BM Date: 11/04/17 General:   Alert, well-developed, male  in NAD EENT:  Normal hearing, non icteric sclera, conjunctive pink.  Heart:  Regular rate and rhythm; no murmurs. No lower extremity edema Pulm: Respiratory slightly increased, on 02 per [place]. NO wheezes heard. Abdomen:  Soft, nondistended, nontender.  Normal bowel sounds, no masses felt.     Neurologic:  Alert and  oriented x4;  grossly normal neurologically. Psych:  Pleasant, cooperative.  Normal mood and affect.   Intake/Output from previous day: 08/08 0701 - 08/09 0700 In: 4580 [P.O.:600; I.V.:817] Out: -  Intake/Output this shift: Total I/O In: -  Out: 925 [Urine:925]  Lab Results: Recent Labs    11/04/17 0538 11/05/17 0518 11/06/17 0419  WBC 15.5* 13.7* 12.4*  HGB 16.3 14.1 12.5*  HCT 48.9 42.5 37.9*  PLT 204 161 124*   BMET Recent Labs    11/04/17 0538 11/05/17 0518 11/06/17 0419  NA 137 134* 132*  K 4.9 4.2 4.4  CL 103 101 98  CO2 20* 19* 20*  GLUCOSE 212* 262* 274*  BUN 14 21* 24*  CREATININE 1.46* 1.50* 1.66*  CALCIUM 8.9 7.5* 6.8*   LFT Recent Labs    11/04/17 0538  11/06/17 0419  PROT 6.7   < > 5.6*  ALBUMIN 3.9   < > 2.9*  AST 38   < > 88*  ALT 28   < > 27  ALKPHOS 81   < >  62  BILITOT 1.0   < > 1.7*  BILIDIR 0.2  --   --   IBILI 0.8  --   --    < > = values in this interval not displayed.      Principal Problem:   Acute pancreatitis Active Problems:   ARF (acute renal failure) ([place])   Essential hypertension     LOS: 2 days   Tye Savoy ,NP 11/06/2017, 1:28 PM

## 2017-11-06 NOTE — Progress Notes (Signed)
Pt stated to RN " I feel like I still can't catch my breath". Dr. Broadus John notified and new orders received. Will continue to closely monitor pt. Delia Heady RN

## 2017-11-06 NOTE — Progress Notes (Signed)
Pt voices relieve and feeling better after voiding couple of times from receiving the dose of IV lasix. Pt oxygen wean down to 2L and pt sating at 94%. Will continue to closely monitor. Delia Heady RN

## 2017-11-06 NOTE — Progress Notes (Signed)
Inpatient Diabetes Program Recommendations  AACE/ADA: New Consensus Statement on Inpatient Glycemic Control (2019)  Target Ranges:  Prepandial:   less than 140 mg/dL      Peak postprandial:   less than 180 mg/dL (1-2 hours)      Critically ill patients:  140 - 180 mg/dL   Results for Paul [name], Paul [name] (MRN [id number]) as of 11/06/2017 09:06  Ref. Range 11/03/2017 19:27 11/04/2017 05:38 11/04/2017 07:44 11/05/2017 05:18 11/06/2017 04:19  Glucose Latest Ref Range: 70 - 99 mg/dL 142 (H) 212 (H)  262 (H) 274 (H)  Hemoglobin A1C Latest Ref Range: 4.8 - 5.6 %   5.5     Review of Glycemic Control  Diabetes history: NO Outpatient Diabetes medications: NA Current orders for Inpatient glycemic control: None  Inpatient Diabetes Program Recommendations:  Correction (SSI): While inpatient, may want to consider ordering CBGs with Novolog correction scale (0-9 units TID and 0-5 units QHS). HgbA1C: A1C 5.5% on 11/04/17.  NOTE: No DM hx. Admitted with pancreatitis which may be cause of hyperglycemia. A1C 5.5% on 11/04/17.   Thanks, Barnie Alderman, RN, MSN, CDE Diabetes Coordinator Inpatient Diabetes Program [phone] (Team Pager from 8am to 5pm)

## 2017-11-06 NOTE — Progress Notes (Signed)
Pt noted to be out of breath whiles speaking with RN; when asked pt report some SOB; pt assessed, diminished lungs but no Rales or Ronchi auscultated; BLE with slight non-pitting edema noted; pt placed on continuous oxygen monitor which pt was in the 80's on RA; placed on 2L and up to 90 and increased to 4L pt sating at 93%. MD paged and notified and ordered to stop fluids; IV infusion stopped. Pt resting comfortably in bed with call light within reach and spouse remains at bedside. Will continue to closely monitor. Delia Heady RN

## 2017-11-06 NOTE — Progress Notes (Signed)
PROGRESS NOTE    Paul [name]  TOI:[id number] DOB: 1981/03/21 DOA: 11/03/2017 PCP: Karleen Hampshire., MD  Brief Narrative: 37 year old male with history of hypertension hypertriglyceridemia, history of renal cell carcinoma status post left nephrectomy in 2012 in [place] admitted overnight with acute pancreatitis, and mild AK I  Assessment & Plan:   1. Acute pancreatitis -Etiology not clear, ?Idiopathic -no history of heavy EtOH use reports drinking couple of beers on weekends only, triglyceride level is within normal range on fenofibrate, No evidence of gallstones on CT and LFTs are unremarkable -slowly improving, advance to full liquid diet, start IV fluids, he is fluid overloaded and developing hypoxia now -appreciate gastroenterology consult -IgG4 is within normal limits, CRP elevated -If recurrent episodes GI recommends MRCP/EUS as outpatient  2. Fluid overload -Secondary to aggressive IV fluids for acute pancreatitis -Stop IV fluids and give Lasix 1 now -monitor clinically  3. Acute kidney injury -Baseline creatinine is 1.1, this admission has been in 1.5 range, likely ATN from above  4. History of renal cell carcinoma, status post left-sided nephrectomy in 2012 -Follow-up with urology  5. Hypertension/rebound tachycardia -resume home dose of atenolol, discontinue IV Lopressor  DVT prophylaxis:add lovenox Code Status: for code Family Communication:wife at bedside Disposition Plan: home in 1-2 days pending clinical improvement  Consultants:  gastroenterology  Procedures:   Antimicrobials:    Subjective: -complains of shortness of breath and swelling -Abdominal pain improving, tolerating liquids  Objective: Vitals:   11/06/17 0449 11/06/17 0500 11/06/17 0748 11/06/17 1156  BP: (!) 159/95  (!) 145/99 (!) 150/96  Pulse: 84  (!) 106 83  Resp: 18  18 18   Temp: 98.9 F (37.2 C)  99 F (37.2 C) 98.9 F (37.2 C)  TempSrc: Oral  Oral Oral  SpO2: 95%  90% 94%    Weight:  90.7 kg    Height:        Intake/Output Summary (Last 24 hours) at 11/06/2017 1330 Last data filed at 11/06/2017 1152 Gross per 24 hour  Intake 240 ml  Output 925 ml  Net -685 ml   Filed Weights   11/03/17 1921 11/05/17 0500 11/06/17 0500  Weight: 89.8 kg 90 kg 90.7 kg    Examination:  Gen: Awake, Alert, Oriented X 3, ill-appearing male, HEENT: PERRLA, Neck supple, no JVD Lungs: decreased breath sounds at both bases CVS: RRR,No Gallops,Rubs or new Murmurs Abd: soft, Non tender, non distended, BS present Extremities: 1+ edema Skin: no new rashes Psychiatry: Judgement and insight appear normal. Mood & affect appropriate.     Data Reviewed:   CBC: Recent Labs  Lab 11/03/17 1927 11/04/17 0538 11/05/17 0518 11/06/17 0419  WBC 18.2* 15.5* 13.7* 12.4*  NEUTROABS 15.2* 13.1*  --   --   HGB 16.8 16.3 14.1 12.5*  HCT 50.8 48.9 42.5 37.9*  MCV 86.4 86.4 86.7 88.1  PLT 259 204 161 833*   Basic Metabolic Panel: Recent Labs  Lab 11/03/17 1927 11/04/17 0538 11/05/17 0518 11/06/17 0419  NA 140 137 134* 132*  K 4.5 4.9 4.2 4.4  CL 105 103 101 98  CO2 22 20* 19* 20*  GLUCOSE 142* 212* 262* 274*  BUN 12 14 21* 24*  CREATININE 1.53* 1.46* 1.50* 1.66*  CALCIUM 9.9 8.9 7.5* 6.8*   GFR: Estimated Creatinine Clearance: 69.7 mL/min (A) (by C-G formula based on SCr of 1.66 mg/dL (H)). Liver Function Tests: Recent Labs  Lab 11/03/17 1927 11/04/17 0538 11/05/17 0518 11/06/17 0419  AST 31 38 85* 88*  ALT 33 28 27 27   ALKPHOS 94 81 59 62  BILITOT 1.4* 1.0 1.8* 1.7*  PROT 7.7 6.7 5.5* 5.6*  ALBUMIN 4.7 3.9 3.2* 2.9*   Recent Labs  Lab 11/03/17 1927 11/05/17 0518 11/06/17 0419  LIPASE 4,842* 2,667* 1,685*   No results for input(s): AMMONIA in the last 168 hours. Coagulation Profile: No results for input(s): INR, PROTIME in the last 168 hours. Cardiac Enzymes: No results for input(s): CKTOTAL, CKMB, CKMBINDEX, TROPONINI in the last 168 hours. BNP (last 3  results) No results for input(s): PROBNP in the last 8760 hours. HbA1C: Recent Labs    11/04/17 0744  HGBA1C 5.5   CBG: No results for input(s): GLUCAP in the last 168 hours. Lipid Profile: Recent Labs    11/04/17 0538  TRIG 86   Thyroid Function Tests: No results for input(s): TSH, T4TOTAL, FREET4, T3FREE, THYROIDAB in the last 72 hours. Anemia Panel: No results for input(s): VITAMINB12, FOLATE, FERRITIN, TIBC, IRON, RETICCTPCT in the last 72 hours. Urine analysis:    Component Value Date/Time   COLORURINE YELLOW 11/03/2017 1923   APPEARANCEUR CLEAR 11/03/2017 1923   LABSPEC 1.015 11/03/2017 1923   PHURINE 5.0 11/03/2017 1923   GLUCOSEU NEGATIVE 11/03/2017 [place] NEGATIVE 11/03/2017 [place] NEGATIVE 11/03/2017 [place] NEGATIVE 11/03/2017 1923   PROTEINUR 30 (A) 11/03/2017 1923   NITRITE NEGATIVE 11/03/2017 1923   LEUKOCYTESUR NEGATIVE 11/03/2017 1923   Sepsis Labs: @LABRCNTIP (procalcitonin:4,lacticidven:4)  )No results found for this or any previous visit (from the past 240 hour(s)).       Radiology Studies: No results found.      Scheduled Meds: . atenolol  50 mg Oral Daily  . enoxaparin (LOVENOX) injection  40 mg Subcutaneous Q24H  . pantoprazole  40 mg Oral Daily  . senna-docusate  1 tablet Oral BID   Continuous Infusions: . sodium chloride 125 mL/hr at 11/06/17 0202     LOS: 2 days    Time spent: 23min    Domenic Polite, MD [facility] Page via [email or web address], password [email or web address] After 7PM please contact night-coverage  11/06/2017, 1:30 PM

## 2017-11-07 ENCOUNTER — Inpatient Hospital Stay (HOSPITAL_COMMUNITY): Payer: 59

## 2017-11-07 LAB — COMPREHENSIVE METABOLIC PANEL
ALK PHOS: 64 U/L (ref 38–126)
ALT: 22 U/L (ref 0–44)
AST: 54 U/L — AB (ref 15–41)
Albumin: 2.5 g/dL — ABNORMAL LOW (ref 3.5–5.0)
Anion gap: 13 (ref 5–15)
BILIRUBIN TOTAL: 1.6 mg/dL — AB (ref 0.3–1.2)
BUN: 20 mg/dL (ref 6–20)
CALCIUM: 6.9 mg/dL — AB (ref 8.9–10.3)
CO2: 21 mmol/L — ABNORMAL LOW (ref 22–32)
CREATININE: 1.42 mg/dL — AB (ref 0.61–1.24)
Chloride: 96 mmol/L — ABNORMAL LOW (ref 98–111)
Glucose, Bld: 279 mg/dL — ABNORMAL HIGH (ref 70–99)
Potassium: 3.7 mmol/L (ref 3.5–5.1)
Sodium: 130 mmol/L — ABNORMAL LOW (ref 135–145)
TOTAL PROTEIN: 5.3 g/dL — AB (ref 6.5–8.1)

## 2017-11-07 LAB — CBC
HEMATOCRIT: 30.6 % — AB (ref 39.0–52.0)
Hemoglobin: 10.4 g/dL — ABNORMAL LOW (ref 13.0–17.0)
MCH: 28.9 pg (ref 26.0–34.0)
MCHC: 34 g/dL (ref 30.0–36.0)
MCV: 85 fL (ref 78.0–100.0)
Platelets: 129 10*3/uL — ABNORMAL LOW (ref 150–400)
RBC: 3.6 MIL/uL — AB (ref 4.22–5.81)
RDW: 13.4 % (ref 11.5–15.5)
WBC: 12.5 10*3/uL — AB (ref 4.0–10.5)

## 2017-11-07 LAB — LIPASE, BLOOD: LIPASE: 571 U/L — AB (ref 11–51)

## 2017-11-07 MED ORDER — POTASSIUM CHLORIDE CRYS ER 20 MEQ PO TBCR
40.0000 meq | EXTENDED_RELEASE_TABLET | Freq: Once | ORAL | Status: AC
  Administered 2017-11-07: 40 meq via ORAL
  Filled 2017-11-07: qty 2

## 2017-11-07 MED ORDER — ALPRAZOLAM 0.5 MG PO TABS
0.5000 mg | ORAL_TABLET | Freq: Once | ORAL | Status: AC
  Administered 2017-11-07: 0.5 mg via ORAL
  Filled 2017-11-07: qty 1

## 2017-11-07 MED ORDER — FUROSEMIDE 10 MG/ML IJ SOLN
20.0000 mg | Freq: Once | INTRAMUSCULAR | Status: AC
  Administered 2017-11-07: 20 mg via INTRAVENOUS
  Filled 2017-11-07: qty 4

## 2017-11-07 MED ORDER — ALPRAZOLAM 0.5 MG PO TABS
0.5000 mg | ORAL_TABLET | Freq: Every evening | ORAL | Status: DC | PRN
  Administered 2017-11-07 – 2017-11-17 (×12): 0.5 mg via ORAL
  Filled 2017-11-07 (×12): qty 1

## 2017-11-07 NOTE — Progress Notes (Signed)
IMPRESSION and PLAN:     36 year old with acute pancreatitis of unclear etiology with mild AKI, shortness of breath.  Being diuresed gradually.  Clinically improving gradually.  Abdominal pain has resolved more or less.    Plan: Continue supportive care.  Agree with diuresis.   Advance to full liquid diet.  Hold off on repeat CT scan due to mild CRI (has only one functioning kidney).  Patient gradually improving.      HPI:   Feels better Shortness of breath is better No abdominal pain Tolerating clear liquids well No fever or chills Chest x-ray-mild pleural effusions   Past Medical History:  Diagnosis Date  . Cancer of kidney [facility]) 2011   "left"  . Elevated blood uric acid level    "I take RX for it; Allopurinol" (11/04/2017)  . GERD (gastroesophageal reflux disease)   . High cholesterol   . Hypertension   . Migraine    "only in my teens" (11/04/2017)    Current Facility-Administered Medications  Medication Dose Route Frequency Provider Last Rate Last Dose  . acetaminophen (TYLENOL) tablet 650 mg  650 mg Oral Q6H PRN Rise Patience, MD       Or  . acetaminophen (TYLENOL) suppository 650 mg  650 mg Rectal Q6H PRN Rise Patience, MD      . ALPRAZolam Duanne Moron) tablet 0.5 mg  0.5 mg Oral QHS PRN Domenic Polite, MD      . atenolol (TENORMIN) tablet 50 mg  50 mg Oral Daily Domenic Polite, MD   50 mg at 11/07/17 0803  . gi cocktail (Maalox,Lidocaine,Donnatal)  30 mL Oral TID PRN Willia Craze, NP   30 mL at 11/05/17 2152  . hydrALAZINE (APRESOLINE) injection 5 mg  5 mg Intravenous Q4H PRN Rise Patience, MD   5 mg at 11/04/17 0220  . HYDROcodone-acetaminophen (NORCO/VICODIN) 5-325 MG per tablet 1 tablet  1 tablet Oral Q6H PRN Domenic Polite, MD   1 tablet at 11/07/17 1557  . HYDROmorphone (DILAUDID) injection 1 mg  1 mg Intravenous Q2H PRN Rise Patience, MD   1 mg at 11/06/17 0523  . ondansetron (ZOFRAN) tablet 4 mg  4 mg Oral Q6H PRN Rise Patience, MD   4 mg at 11/07/17 0324   Or  . ondansetron (ZOFRAN) injection 4 mg  4 mg Intravenous Q6H PRN Rise Patience, MD   4 mg at 11/07/17 1557  . pantoprazole (PROTONIX) EC tablet 40 mg  40 mg Oral Daily Domenic Polite, MD   40 mg at 11/07/17 0803  . senna-docusate (Senokot-S) tablet 1 tablet  1 tablet Oral BID Domenic Polite, MD   1 tablet at 11/07/17 0803  . zolpidem (AMBIEN) tablet 5 mg  5 mg Oral QHS Domenic Polite, MD   5 mg at 11/06/17 2241    Past Surgical History:  Procedure Laterality Date  . NEPHRECTOMY Left 2011   "kidney cancer"  . UPPER GASTROINTESTINAL ENDOSCOPY  02/2017    History reviewed. No pertinent family history.  Social History   Tobacco Use  . Smoking status: Former Smoker    Packs/day: 0.50    Years: 11.00    Pack years: 5.50    Types: Cigarettes    Last attempt to quit: 2011    Years since quitting: 8.6  . Smokeless tobacco: Never Used  Substance Use Topics  . Alcohol use: Yes    Alcohol/week: 4.0 standard drinks    Types: 4 Cans  of beer per week  . Drug use: Never    No Known Allergies   Review of Systems: All systems reviewed and negative except where noted in HPI.    Physical Exam:     BP (!) 154/96 (BP Location: Right Arm)   Pulse 89   Temp 99.7 F (37.6 C) (Oral)   Resp 15   Ht 5\' 10"  (1.778 m)   Wt 90.7 kg   SpO2 96%   BMI 28.69 kg/m  @WEIGHTLAST3 @ GENERAL:  Alert, oriented, cooperative, not in acute distress. PSYCH: :Pleasant, normal mood and affect. HEENT:  conjunctiva pink, mucous membranes moist, neck supple without masses. No jaundice. CARDIAC:  S1 S2 normal. No murmers. PULM: Normal respiratory effort, lungs CTA bilaterally, no wheezing. ABDOMEN: Inspection: No visible peristalsis, no abnormal pulsations, skin normal.  Palpation/percussion: Soft, nontender, nondistended, no rigidity, no abnormal dullness to percussion, no hepatosplenomegaly and no palpable abdominal masses.  Auscultation: Normal bowel  sounds, no abdominal bruits. Rectal exam: Deferred SKIN:  turgor, no lesions seen. Musculoskeletal:  Normal muscle tone, normal strength. NEURO: Alert and oriented x 3, no focal neurologic deficits.   Data Reviewed: I have personally reviewed following labs and imaging studies  CBC: Recent Labs  Lab 11/03/17 1927 11/04/17 0538 11/05/17 0518 11/06/17 0419 11/07/17 0515  WBC 18.2* 15.5* 13.7* 12.4* 12.5*  NEUTROABS 15.2* 13.1*  --   --   --   HGB 16.8 16.3 14.1 12.5* 10.4*  HCT 50.8 48.9 42.5 37.9* 30.6*  MCV 86.4 86.4 86.7 88.1 85.0  PLT 259 204 161 124* 962*   Basic Metabolic Panel: Recent Labs  Lab 11/05/17 0518 11/06/17 0419 11/07/17 0515  NA 134* 132* 130*  K 4.2 4.4 3.7  CL 101 98 96*  CO2 19* 20* 21*  GLUCOSE 262* 274* 279*  BUN 21* 24* 20  CREATININE 1.50* 1.66* 1.42*  CALCIUM 7.5* 6.8* 6.9*   GFR: Estimated Creatinine Clearance: 81.5 mL/min (A) (by C-G formula based on SCr of 1.42 mg/dL (H)). Liver Function Tests: Recent Labs  Lab 11/03/17 1927 11/04/17 0538 11/05/17 0518 11/06/17 0419 11/07/17 0515  AST 31 38 85* 88* 54*  ALT 33 28 27 27 22   ALKPHOS 94 81 59 62 64  BILITOT 1.4* 1.0 1.8* 1.7* 1.6*  PROT 7.7 6.7 5.5* 5.6* 5.3*  ALBUMIN 4.7 3.9 3.2* 2.9* 2.5*   Recent Labs  Lab 11/05/17 0518 11/06/17 0419 11/07/17 0515  LIPASE 2,667* 1,685* 571*   No results for input(s): AMMONIA in the last 168 hours. Coagulation Profile: No results for input(s): INR, PROTIME in the last 168 hours. HbA1C: No results for input(s): HGBA1C in the last 72 hours. Lipid Profile: No results for input(s): CHOL, HDL, LDLCALC, TRIG, CHOLHDL, LDLDIRECT in the last 72 hours. Thyroid Function Tests: No results for input(s): TSH, T4TOTAL, FREET4, T3FREE, THYROIDAB in the last 72 hours. Anemia Panel: No results for input(s): VITAMINB12, FOLATE, FERRITIN, TIBC, IRON, RETICCTPCT in the last 72 hours.  No results found for this or any previous visit (from the past 240  hour(s)).    Radiology Studies: Dg Chest 2 View  Result Date: 11/07/2017 CLINICAL DATA:  Hypoxia, shortness of breath and pancreatitis. EXAM: CHEST - 2 VIEW COMPARISON:  Right rib films on 09/05/2016 FINDINGS: The heart size is normal. Lung volumes are relatively low with bibasilar atelectasis present and small posterior pleural effusions, slightly greater on the left. No pulmonary edema or pneumothorax. No nodules detected. Bony thorax is unremarkable. IMPRESSION: Low lung volumes with small  bilateral posterior pleural effusions, slightly greater on the left. Associated bibasilar atelectasis. Electronically Signed   By: Aletta Edouard M.D.   On: 11/07/2017 10:13   Ct Abdomen Pelvis W Contrast  Result Date: 11/04/2017 CLINICAL DATA:  Acute onset of severe generalized abdominal pain and nausea. EXAM: CT ABDOMEN AND PELVIS WITH CONTRAST TECHNIQUE: Multidetector CT imaging of the abdomen and pelvis was performed using the standard protocol following bolus administration of intravenous contrast. CONTRAST:  1mL OMNIPAQUE IOHEXOL 300 MG/ML  SOLN COMPARISON:  PET/CT performed 09/15/2016 FINDINGS: Lower chest: Minimal bibasilar atelectasis is noted. The visualized portions of the mediastinum are unremarkable. Hepatobiliary: The liver is unremarkable in appearance. The gallbladder is unremarkable in appearance. The common bile duct remains normal in caliber. Trace ascites is noted about the liver. Pancreas: Diffuse soft tissue inflammation is noted about the entirety of the pancreas, compatible with acute pancreatitis. There is question of mild devascularization about the head and proximal body of the pancreas, difficult to fully characterize. No pseudocyst formation is identified at this time. Associated free fluid tracks about the upper abdomen and along the paracolic gutters bilaterally into the pelvis. Spleen: The spleen is unremarkable in appearance. Adrenals/Urinary Tract: The right adrenal gland is  unremarkable in appearance. The patient is status post left-sided adrenalectomy and nephrectomy. The right kidney is unremarkable. There is no evidence of hydronephrosis. No renal or ureteral stones are identified. No perinephric stranding is seen. Stomach/Bowel: The stomach is unremarkable in appearance. The small bowel is within normal limits. The appendix is normal in caliber, without evidence of appendicitis. The colon is unremarkable in appearance. Vascular/Lymphatic: The abdominal aorta is unremarkable in appearance. The inferior vena cava is grossly unremarkable. No retroperitoneal lymphadenopathy is seen. No pelvic sidewall lymphadenopathy is identified. Reproductive: The bladder is mildly distended and grossly unremarkable. The prostate remains normal in size. Other: No additional soft tissue abnormalities are seen. Musculoskeletal: No acute osseous abnormalities are identified. Scattered bone islands are noted within the proximal humerus bilaterally. The visualized musculature is unremarkable in appearance. IMPRESSION: 1. Diffuse soft tissue inflammation about the entirety of the pancreas, compatible with acute pancreatitis. There is question of mild devascularization about the head and proximal body of the pancreas, difficult to fully characterize. No pseudocyst formation identified at this time. 2. Associated free fluid tracks about the upper abdomen and along the paracolic gutters bilaterally into the pelvis, with trace free fluid about the liver. Electronically Signed   By: Garald Balding M.D.   On: 11/04/2017 01:30      Keonta Alsip,MD 11/07/2017, 5:00 PM   CC No ref. provider found

## 2017-11-07 NOTE — Progress Notes (Signed)
Patient refused Senna and Ambien wanted only Xanax, I gave him one Norco and Zofran IV to interrupt any nausea as he says that oral medications make him nauseous he also had some pudding before medications. He is very tired and says that he wants sleep.

## 2017-11-07 NOTE — Progress Notes (Signed)
PROGRESS NOTE    Paul [name]  ERD:[id number] DOB: 02-07-1981 DOA: 11/03/2017 PCP: Karleen Hampshire., MD  Brief Narrative: 37 year old male with history of hypertension hypertriglyceridemia, history of renal cell carcinoma status post left nephrectomy in 2012 in [place] admitted overnight with acute pancreatitis, and mild AKI. -hospitalization complicated by hypoxia/volume overload  Assessment & Plan:   1. Acute pancreatitis -Etiology not clear, ?Idiopathic -no history of heavy EtOH use (although may not be forthcoming), triglycerides within normal range, no gallstones noted on CT, IgG4 within normal limits, rules out autoimmune pancreatitis -gastroenterology following, appreciate input -Continue slow improvement, continue full liquid diet today -IV fluids discontinued yesterday due to volume overload -If recurrent episodes GI recommends MRCP/EUS as outpatient  2. Fluid overload -diuresed aggressively with low-dose Lasix yesterday, continues to have mild hypoxia and decreased breath sounds at both bases, suspect small pleural effusions will give another dose of Lasix today, check CXR -Wean O2 as tolerated -incentive spirometry  3. Acute kidney injury -Baseline creatinine is 1.1, this admission has been in 1.5 range, likely ATN from above -improving, monitor  4. History of renal cell carcinoma, status post left-sided nephrectomy in 2012 -Follow-up with urology  5. Hypertension/rebound tachycardia -continue atenolol  DVT prophylaxis: lovenox Code Status: for code Family Communication:wife at bedside Disposition Plan: home in 1-2 days pending clinical improvement  Consultants:  gastroenterology  Procedures:   Antimicrobials:    Subjective: -Abdominal discomfort is much better, shortness of breath is improving however not back to baseline yet  Objective: Vitals:   11/06/17 1959 11/06/17 2335 11/07/17 0345 11/07/17 0804  BP: (!) 154/92 138/86 (!) 151/85 (!) 141/107    Pulse: 88 88 88 (!) 108  Resp: 18 18 16 20   Temp: 99.7 F (37.6 C) 98.9 F (37.2 C) 98.9 F (37.2 C) 98 F (36.7 C)  TempSrc: Oral Oral Oral Oral  SpO2:  94% 95% 93%  Weight:      Height:        Intake/Output Summary (Last 24 hours) at 11/07/2017 1142 Last data filed at 11/07/2017 0700 Gross per 24 hour  Intake 590 ml  Output 4350 ml  Net -3760 ml   Filed Weights   11/03/17 1921 11/05/17 0500 11/06/17 0500  Weight: 89.8 kg 90 kg 90.7 kg    Examination:  Gen: Awake, Alert, Oriented X 3, ill-appearing male HEENT: PERRLA, Neck supple, no JVD Lungs: decreased breath sounds at both bases CVS: RRR,No Gallops,Rubs or new Murmurs Abd: soft, Non tender, non distended, BS present Extremities: trace edema Skin: no new rashes Psychiatry: Judgement and insight appear normal. Mood & affect appropriate.     Data Reviewed:   CBC: Recent Labs  Lab 11/03/17 1927 11/04/17 0538 11/05/17 0518 11/06/17 0419 11/07/17 0515  WBC 18.2* 15.5* 13.7* 12.4* 12.5*  NEUTROABS 15.2* 13.1*  --   --   --   HGB 16.8 16.3 14.1 12.5* 10.4*  HCT 50.8 48.9 42.5 37.9* 30.6*  MCV 86.4 86.4 86.7 88.1 85.0  PLT 259 204 161 124* 563*   Basic Metabolic Panel: Recent Labs  Lab 11/03/17 1927 11/04/17 0538 11/05/17 0518 11/06/17 0419 11/07/17 0515  NA 140 137 134* 132* 130*  K 4.5 4.9 4.2 4.4 3.7  CL 105 103 101 98 96*  CO2 22 20* 19* 20* 21*  GLUCOSE 142* 212* 262* 274* 279*  BUN 12 14 21* 24* 20  CREATININE 1.53* 1.46* 1.50* 1.66* 1.42*  CALCIUM 9.9 8.9 7.5* 6.8* 6.9*   GFR: Estimated Creatinine Clearance: 81.5  mL/min (A) (by C-G formula based on SCr of 1.42 mg/dL (H)). Liver Function Tests: Recent Labs  Lab 11/03/17 1927 11/04/17 0538 11/05/17 0518 11/06/17 0419 11/07/17 0515  AST 31 38 85* 88* 54*  ALT 33 28 27 27 22   ALKPHOS 94 81 59 62 64  BILITOT 1.4* 1.0 1.8* 1.7* 1.6*  PROT 7.7 6.7 5.5* 5.6* 5.3*  ALBUMIN 4.7 3.9 3.2* 2.9* 2.5*   Recent Labs  Lab 11/03/17 1927  11/05/17 0518 11/06/17 0419 11/07/17 0515  LIPASE 4,842* 2,667* 1,685* 571*   No results for input(s): AMMONIA in the last 168 hours. Coagulation Profile: No results for input(s): INR, PROTIME in the last 168 hours. Cardiac Enzymes: No results for input(s): CKTOTAL, CKMB, CKMBINDEX, TROPONINI in the last 168 hours. BNP (last 3 results) No results for input(s): PROBNP in the last 8760 hours. HbA1C: No results for input(s): HGBA1C in the last 72 hours. CBG: No results for input(s): GLUCAP in the last 168 hours. Lipid Profile: No results for input(s): CHOL, HDL, LDLCALC, TRIG, CHOLHDL, LDLDIRECT in the last 72 hours. Thyroid Function Tests: No results for input(s): TSH, T4TOTAL, FREET4, T3FREE, THYROIDAB in the last 72 hours. Anemia Panel: No results for input(s): VITAMINB12, FOLATE, FERRITIN, TIBC, IRON, RETICCTPCT in the last 72 hours. Urine analysis:    Component Value Date/Time   COLORURINE YELLOW 11/03/2017 1923   APPEARANCEUR CLEAR 11/03/2017 1923   LABSPEC 1.015 11/03/2017 [place] 5.0 11/03/2017 1923   GLUCOSEU NEGATIVE 11/03/2017 [place] NEGATIVE 11/03/2017 [place] NEGATIVE 11/03/2017 [place] NEGATIVE 11/03/2017 1923   PROTEINUR 30 (A) 11/03/2017 1923   NITRITE NEGATIVE 11/03/2017 1923   LEUKOCYTESUR NEGATIVE 11/03/2017 1923   Sepsis Labs: @LABRCNTIP (procalcitonin:4,lacticidven:4)  )No results found for this or any previous visit (from the past 240 hour(s)).       Radiology Studies: Dg Chest 2 View  Result Date: 11/07/2017 CLINICAL DATA:  Hypoxia, shortness of breath and pancreatitis. EXAM: CHEST - 2 VIEW COMPARISON:  Right rib films on 09/05/2016 FINDINGS: The heart size is normal. Lung volumes are relatively low with bibasilar atelectasis present and small posterior pleural effusions, slightly greater on the left. No pulmonary edema or pneumothorax. No nodules detected. Bony thorax is unremarkable. IMPRESSION: Low lung volumes  with small bilateral posterior pleural effusions, slightly greater on the left. Associated bibasilar atelectasis. Electronically Signed   By: Aletta Edouard M.D.   On: 11/07/2017 10:13        Scheduled Meds: . ALPRAZolam  0.5 mg Oral Once  . atenolol  50 mg Oral Daily  . enoxaparin (LOVENOX) injection  40 mg Subcutaneous Q24H  . pantoprazole  40 mg Oral Daily  . senna-docusate  1 tablet Oral BID  . zolpidem  5 mg Oral QHS   Continuous Infusions:    LOS: 3 days    Time spent: 63min    Domenic Polite, MD [facility] Page via [email or web address], password [email or web address] After 7PM please contact night-coverage  11/07/2017, 11:42 AM

## 2017-11-08 LAB — COMPREHENSIVE METABOLIC PANEL
ALT: 27 U/L (ref 0–44)
AST: 49 U/L — AB (ref 15–41)
Albumin: 2.7 g/dL — ABNORMAL LOW (ref 3.5–5.0)
Alkaline Phosphatase: 72 U/L (ref 38–126)
Anion gap: 15 (ref 5–15)
BILIRUBIN TOTAL: 2.2 mg/dL — AB (ref 0.3–1.2)
BUN: 18 mg/dL (ref 6–20)
CHLORIDE: 92 mmol/L — AB (ref 98–111)
CO2: 21 mmol/L — ABNORMAL LOW (ref 22–32)
Calcium: 7.8 mg/dL — ABNORMAL LOW (ref 8.9–10.3)
Creatinine, Ser: 1.39 mg/dL — ABNORMAL HIGH (ref 0.61–1.24)
GFR calc non Af Amer: >60 mL/min (ref >60)
Glucose, Bld: 263 mg/dL — ABNORMAL HIGH (ref 70–99)
POTASSIUM: 3.7 mmol/L (ref 3.5–5.1)
Sodium: 128 mmol/L — ABNORMAL LOW (ref 135–145)
TOTAL PROTEIN: 5.8 g/dL — AB (ref 6.5–8.1)

## 2017-11-08 LAB — BILIRUBIN, FRACTIONATED(TOT/DIR/INDIR)
BILIRUBIN DIRECT: 2.4 mg/dL — AB (ref 0.0–0.2)
Indirect Bilirubin: 0.5 mg/dL (ref 0.3–0.9)
Total Bilirubin: 2.9 mg/dL — ABNORMAL HIGH (ref 0.3–1.2)

## 2017-11-08 LAB — CBC
HEMATOCRIT: 32 % — AB (ref 39.0–52.0)
Hemoglobin: 10.7 g/dL — ABNORMAL LOW (ref 13.0–17.0)
MCH: 28.8 pg (ref 26.0–34.0)
MCHC: 33.4 g/dL (ref 30.0–36.0)
MCV: 86.3 fL (ref 78.0–100.0)
PLATELETS: 157 10*3/uL (ref 150–400)
RBC: 3.71 MIL/uL — ABNORMAL LOW (ref 4.22–5.81)
RDW: 13.6 % (ref 11.5–15.5)
WBC: 12.8 10*3/uL — AB (ref 4.0–10.5)

## 2017-11-08 MED ORDER — FUROSEMIDE 10 MG/ML IJ SOLN
40.0000 mg | Freq: Once | INTRAMUSCULAR | Status: AC
  Administered 2017-11-08: 40 mg via INTRAVENOUS
  Filled 2017-11-08: qty 4

## 2017-11-08 MED ORDER — ENOXAPARIN SODIUM 40 MG/0.4ML ~~LOC~~ SOLN
40.0000 mg | SUBCUTANEOUS | Status: DC
  Administered 2017-11-08 – 2017-11-10 (×3): 40 mg via SUBCUTANEOUS
  Filled 2017-11-08 (×3): qty 0.4

## 2017-11-08 MED ORDER — POTASSIUM CHLORIDE CRYS ER 20 MEQ PO TBCR
40.0000 meq | EXTENDED_RELEASE_TABLET | Freq: Once | ORAL | Status: AC
  Administered 2017-11-08: 40 meq via ORAL
  Filled 2017-11-08: qty 2

## 2017-11-08 NOTE — Progress Notes (Signed)
PROGRESS NOTE    Paul [name]  KGU:[id number] DOB: 24-Jul-1980 DOA: 11/03/2017 PCP: Karleen Hampshire., MD  Brief Narrative: 37 year old male with history of hypertension hypertriglyceridemia, history of renal cell carcinoma status post left nephrectomy in 2012 in [place] admitted overnight with acute pancreatitis, and mild AKI. -hospitalization complicated by hypoxia/volume overload  Assessment & Plan:   1. Acute pancreatitis -Etiology not clear, ?Idiopathic -no history of heavy EtOH use (although may not be forthcoming), triglycerides within normal range, no gallstones noted on CT, IgG4 within normal limits -gastroenterology following, appreciate input -overall stable and slow improvement, advance to soft diet if GI agrees -IV fluids discontinued 8/9 due to volume overload -If recurrent episodes GI recommends MRCP/EUS as outpatient  2. Fluid overload -Due to aggressive fluid resuscitation and third spacing from severe pancreatitis/hypoalbuminemia -diuresed aggressively with low-dose Lasix 8/9, continues to have mild hypoxia and decreased breath sounds at both bases, small pleural effusions will give another dose of Lasix today -Wean O2 off, ambulate -incentive spirometry  3. Acute kidney injury -Baseline creatinine is 1.1, this admission has been in 1.5 range, likely ATN from above -improving, monitor  4. History of renal cell carcinoma, status post left-sided nephrectomy in 2012 -Follow-up with urology  5. Hypertension/rebound tachycardia -continue atenolol  DVT prophylaxis: lovenox Code Status: for code Family Communication:wife at bedside Disposition Plan: home in 1-2 days pending clinical improvement  Consultants:  gastroenterology  Procedures:   Antimicrobials:    Subjective: -breathing improving, not completely back to normal yet, -Had a lot of abdominal discomfort last night which improved after bowel movements  Objective: Vitals:   11/07/17 1600 11/07/17 1936  11/08/17 0416 11/08/17 0738  BP: (!) 154/96 (!) 146/94 (!) 142/93 (!) 148/93  Pulse: 89 89 84 99  Resp: 15 18 18    Temp: 99.7 F (37.6 C) 98.8 F (37.1 C) 98.9 F (37.2 C) 99.1 F (37.3 C)  TempSrc: Oral Oral Oral Oral  SpO2: 96% 92% 96% 95%  Weight:      Height:        Intake/Output Summary (Last 24 hours) at 11/08/2017 1243 Last data filed at 11/07/2017 2300 Gross per 24 hour  Intake -  Output 1650 ml  Net -1650 ml   Filed Weights   11/03/17 1921 11/05/17 0500 11/06/17 0500  Weight: 89.8 kg 90 kg 90.7 kg    Examination:  Gen: Awake, Alert, Oriented X 3, /ill-appearing male HEENT: PERRLA, Neck supple, no JVD Lungs: decreased breath sounds at both bases CVS: RRR,No Gallops,Rubs or new Murmurs Abd: soft, Non tender, non distended, BS present Extremities: trace edema Skin: no new rashes Psychiatry: Judgement and insight appear normal. Mood & affect appropriate.     Data Reviewed:   CBC: Recent Labs  Lab 11/03/17 1927 11/04/17 0538 11/05/17 0518 11/06/17 0419 11/07/17 0515 11/08/17 0456  WBC 18.2* 15.5* 13.7* 12.4* 12.5* 12.8*  NEUTROABS 15.2* 13.1*  --   --   --   --   HGB 16.8 16.3 14.1 12.5* 10.4* 10.7*  HCT 50.8 48.9 42.5 37.9* 30.6* 32.0*  MCV 86.4 86.4 86.7 88.1 85.0 86.3  PLT 259 204 161 124* 129* 628   Basic Metabolic Panel: Recent Labs  Lab 11/04/17 0538 11/05/17 0518 11/06/17 0419 11/07/17 0515 11/08/17 0456  NA 137 134* 132* 130* 128*  K 4.9 4.2 4.4 3.7 3.7  CL 103 101 98 96* 92*  CO2 20* 19* 20* 21* 21*  GLUCOSE 212* 262* 274* 279* 263*  BUN 14 21* 24* 20 18  CREATININE 1.46* 1.50* 1.66* 1.42* 1.39*  CALCIUM 8.9 7.5* 6.8* 6.9* 7.8*   GFR: Estimated Creatinine Clearance: 83.2 mL/min (A) (by C-G formula based on SCr of 1.39 mg/dL (H)). Liver Function Tests: Recent Labs  Lab 11/04/17 0538 11/05/17 0518 11/06/17 0419 11/07/17 0515 11/08/17 0456  AST 38 85* 88* 54* 49*  ALT 28 27 27 22 27   ALKPHOS 81 59 62 64 72  BILITOT 1.0  1.8* 1.7* 1.6* 2.2*  PROT 6.7 5.5* 5.6* 5.3* 5.8*  ALBUMIN 3.9 3.2* 2.9* 2.5* 2.7*   Recent Labs  Lab 11/03/17 1927 11/05/17 0518 11/06/17 0419 11/07/17 0515  LIPASE 4,842* 2,667* 1,685* 571*   No results for input(s): AMMONIA in the last 168 hours. Coagulation Profile: No results for input(s): INR, PROTIME in the last 168 hours. Cardiac Enzymes: No results for input(s): CKTOTAL, CKMB, CKMBINDEX, TROPONINI in the last 168 hours. BNP (last 3 results) No results for input(s): PROBNP in the last 8760 hours. HbA1C: No results for input(s): HGBA1C in the last 72 hours. CBG: No results for input(s): GLUCAP in the last 168 hours. Lipid Profile: No results for input(s): CHOL, HDL, LDLCALC, TRIG, CHOLHDL, LDLDIRECT in the last 72 hours. Thyroid Function Tests: No results for input(s): TSH, T4TOTAL, FREET4, T3FREE, THYROIDAB in the last 72 hours. Anemia Panel: No results for input(s): VITAMINB12, FOLATE, FERRITIN, TIBC, IRON, RETICCTPCT in the last 72 hours. Urine analysis:    Component Value Date/Time   COLORURINE YELLOW 11/03/2017 1923   APPEARANCEUR CLEAR 11/03/2017 1923   LABSPEC 1.015 11/03/2017 1923   PHURINE 5.0 11/03/2017 1923   GLUCOSEU NEGATIVE 11/03/2017 [place] NEGATIVE 11/03/2017 [place] NEGATIVE 11/03/2017 [place] NEGATIVE 11/03/2017 1923   PROTEINUR 30 (A) 11/03/2017 1923   NITRITE NEGATIVE 11/03/2017 1923   LEUKOCYTESUR NEGATIVE 11/03/2017 1923   Sepsis Labs: @LABRCNTIP (procalcitonin:4,lacticidven:4)  )No results found for this or any previous visit (from the past 240 hour(s)).       Radiology Studies: Dg Chest 2 View  Result Date: 11/07/2017 CLINICAL DATA:  Hypoxia, shortness of breath and pancreatitis. EXAM: CHEST - 2 VIEW COMPARISON:  Right rib films on 09/05/2016 FINDINGS: The heart size is normal. Lung volumes are relatively low with bibasilar atelectasis present and small posterior pleural effusions, slightly greater on the  left. No pulmonary edema or pneumothorax. No nodules detected. Bony thorax is unremarkable. IMPRESSION: Low lung volumes with small bilateral posterior pleural effusions, slightly greater on the left. Associated bibasilar atelectasis. Electronically Signed   By: Aletta Edouard M.D.   On: 11/07/2017 10:13        Scheduled Meds: . atenolol  50 mg Oral Daily  . pantoprazole  40 mg Oral Daily  . senna-docusate  1 tablet Oral BID  . zolpidem  5 mg Oral QHS   Continuous Infusions:    LOS: 4 days    Time spent: 43min    Domenic Polite, MD [facility] Page via [email or web address], password [email or web address] After 7PM please contact night-coverage  11/08/2017, 12:43 PM

## 2017-11-08 NOTE — Progress Notes (Signed)
Patient woke up about 0300 abdomen bloated bowel sounds were active in all four quadrants and belly was taught before ambulating. After ambulating bowel sounds were barley audible and still taught no real pain on either assessment just general discomfort. Will get a GI cocktail from pharmacy for patient as he still fells uncomfortable.

## 2017-11-08 NOTE — Progress Notes (Addendum)
[facility] Progress Note   Chief Complaint:   pancreatitis   SUBJECTIVE:    abdominal pain 75% better than when admitted. Sleeping very little in hospital. Still some shortness of breathing, especially when lying flat.    ASSESSMENT AND PLAN:   1. 37 yo male with acute pancreatitis of unclear etiology. IgG4 negative.Low suspicion for Etoh induced. No suspicious medications. No evidence for biliary pancreatitis though tbili is up some to 2.2. Abdominal pain continues to improve. WBC stable at 12.8 -fractionate bili.  -trial of low fat diet -If future episodes then MRCP or EUS  2. Dyspnea, probably volume related. CXR yesterday -small bilateral pleural effusions. Shortness of breath worse when lying flat so sound volume related. He has been ambulating in halls just now and breathing slightly labored. No chest pain.  -Hesitant to give another dose of lasix given renal function / solitary kidney . Additionally he has a negative I/0 balance and no edema.  -One consideration is that he is developing intraabdominal fluid collection related to pancreatitis. He does feel a little bloated. I don't think evidence enough to re-scan him yet.    3. AKI, improving. Cr down to 1.39 now.   4. [place] anemia, hgb normal on admit. Probably hemodilution after aggressive IVF.   5. Thrombocytopenia, new this admit and probably related to illness. Platelets normal on admit at 254, now 157.   6. Hyperglycemia, BS in upper 200's. He has no hx of DM, hgb A1c normal at 5.5. He wasn't getting dextrose in IVF. Etiology of elevated blood sugars ? due to pancreatic endocrine dysfunction secondary to the acute inflammation of pancreas     Attending physician's note   I have taken an interval history, reviewed the chart and examined the patient. I agree with the Advanced Practitioner's note, impression and recommendations.   37 year Paul [name] with acute severe pancreatitis of unknown etiology with AKI  (with only one functioning kidney), shortness of breath, thrombocytopenia and hypoglycemia who is improving gradually. Plan: Trial of low-fat diet in a.m, repeat CT scan abdomen once his creatinine is back to baseline.  Continue supportive treatment for now. Will sign off to Dr. Fuller Plan.  Carmell Austria, MD  OBJECTIVE:     Vital signs in last 24 hours: Temp:  [98.2 F (36.8 C)-99.7 F (37.6 C)] 98.7 F (37.1 C) (08/11 1248) Pulse Rate:  [82-99] 84 (08/11 1248) Resp:  [15-18] 18 (08/11 0416) BP: (140-154)/(87-98) 144/98 (08/11 1248) SpO2:  [92 %-96 %] 95 % (08/11 1248) Last BM Date: 11/07/17 General:   Alert, well-developed,  male  in NAD EENT:  Normal hearing, non icteric sclera, conjunctive pink.  Heart:  Regular rate and rhythm; no murmurs. No lower extremity edema Pulm: mildly labored respiratory effort after ambulating. Lungs CTA. Abdomen:  Soft, mildly distended, a few bowel sounds, no masses felt.     Neurologic:  Alert and  oriented x4;  grossly normal neurologically. Psych:  Pleasant, cooperative.  Normal mood and affect.   Intake/Output from previous day: 08/10 0701 - 08/11 0700 In: -  Out: 2050 [Urine:2050] Intake/Output this shift: No intake/output data recorded.  Lab Results: Recent Labs    11/06/17 0419 11/07/17 0515 11/08/17 0456  WBC 12.4* 12.5* 12.8*  HGB 12.5* 10.4* 10.7*  HCT 37.9* 30.6* 32.0*  PLT 124* 129* 157   BMET Recent Labs    11/06/17 0419 11/07/17 0515 11/08/17 0456  NA 132* 130* 128*  K 4.4 3.7 3.7  CL 98 96* 92*  CO2 20* 21* 21*  GLUCOSE 274* 279* 263*  BUN 24* 20 18  CREATININE 1.66* 1.42* 1.39*  CALCIUM 6.8* 6.9* 7.8*   LFT Recent Labs    11/08/17 0456  PROT 5.8*  ALBUMIN 2.7*  AST 49*  ALT 27  ALKPHOS 72  BILITOT 2.2*   PT/INR No results for input(s): LABPROT, INR in the last 72 hours. Hepatitis Panel No results for input(s): HEPBSAG, HCVAB, HEPAIGM, HEPBIGM in the last 72 hours.  Dg Chest 2 View  Result Date:  11/07/2017 CLINICAL DATA:  Hypoxia, shortness of breath and pancreatitis. EXAM: CHEST - 2 VIEW COMPARISON:  Right rib films on 09/05/2016 FINDINGS: The heart size is normal. Lung volumes are relatively low with bibasilar atelectasis present and small posterior pleural effusions, slightly greater on the left. No pulmonary edema or pneumothorax. No nodules detected. Bony thorax is unremarkable. IMPRESSION: Low lung volumes with small bilateral posterior pleural effusions, slightly greater on the left. Associated bibasilar atelectasis. Electronically Signed   By: Aletta Edouard M.D.   On: 11/07/2017 10:13     Principal Problem:   Acute pancreatitis Active Problems:   ARF (acute renal failure) ([place])   Essential hypertension     LOS: 4 days   Tye Savoy ,NP 11/08/2017, 12:54 PM

## 2017-11-09 ENCOUNTER — Encounter (HOSPITAL_COMMUNITY): Payer: Self-pay

## 2017-11-09 DIAGNOSIS — D72829 Elevated white blood cell count, unspecified: Secondary | ICD-10-CM

## 2017-11-09 DIAGNOSIS — R140 Abdominal distension (gaseous): Secondary | ICD-10-CM

## 2017-11-09 DIAGNOSIS — R5081 Fever presenting with conditions classified elsewhere: Secondary | ICD-10-CM

## 2017-11-09 LAB — COMPREHENSIVE METABOLIC PANEL
ALBUMIN: 2.5 g/dL — AB (ref 3.5–5.0)
ALT: 39 U/L (ref 0–44)
AST: 64 U/L — AB (ref 15–41)
Alkaline Phosphatase: 82 U/L (ref 38–126)
Anion gap: 16 — ABNORMAL HIGH (ref 5–15)
BUN: 16 mg/dL (ref 6–20)
CO2: 20 mmol/L — AB (ref 22–32)
CREATININE: 1.39 mg/dL — AB (ref 0.61–1.24)
Calcium: 7.8 mg/dL — ABNORMAL LOW (ref 8.9–10.3)
Chloride: 91 mmol/L — ABNORMAL LOW (ref 98–111)
GFR calc Af Amer: >60 mL/min (ref >60)
GFR calc non Af Amer: >60 mL/min (ref >60)
GLUCOSE: 246 mg/dL — AB (ref 70–99)
Potassium: 3.9 mmol/L (ref 3.5–5.1)
SODIUM: 127 mmol/L — AB (ref 135–145)
Total Bilirubin: 3.8 mg/dL — ABNORMAL HIGH (ref 0.3–1.2)
Total Protein: 5.8 g/dL — ABNORMAL LOW (ref 6.5–8.1)

## 2017-11-09 LAB — CBC
HCT: 30.5 % — ABNORMAL LOW (ref 39.0–52.0)
HEMOGLOBIN: 10.1 g/dL — AB (ref 13.0–17.0)
MCH: 28.5 pg (ref 26.0–34.0)
MCHC: 33.1 g/dL (ref 30.0–36.0)
MCV: 85.9 fL (ref 78.0–100.0)
Platelets: 174 10*3/uL (ref 150–400)
RBC: 3.55 MIL/uL — AB (ref 4.22–5.81)
RDW: 14 % (ref 11.5–15.5)
WBC: 13.2 10*3/uL — AB (ref 4.0–10.5)

## 2017-11-09 LAB — BILIRUBIN, DIRECT: BILIRUBIN DIRECT: 3.1 mg/dL — AB (ref 0.0–0.2)

## 2017-11-09 LAB — LIPASE, BLOOD: Lipase: 70 U/L — ABNORMAL HIGH (ref 11–51)

## 2017-11-09 LAB — GLUCOSE, CAPILLARY
GLUCOSE-CAPILLARY: 234 mg/dL — AB (ref 70–99)
GLUCOSE-CAPILLARY: 218 mg/dL — AB (ref 70–99)

## 2017-11-09 MED ORDER — DIPHENOXYLATE-ATROPINE 2.5-0.025 MG PO TABS: 1.0000 | ORAL_TABLET | Freq: Four times a day (QID) | ORAL | Status: DC | PRN

## 2017-11-09 MED ORDER — INSULIN ASPART 100 UNIT/ML ~~LOC~~ SOLN
0.0000 [IU] | Freq: Three times a day (TID) | SUBCUTANEOUS | Status: DC
  Administered 2017-11-09 – 2017-11-11 (×5): 3 [IU] via SUBCUTANEOUS
  Administered 2017-11-11: 2 [IU] via SUBCUTANEOUS
  Administered 2017-11-11: 3 [IU] via SUBCUTANEOUS

## 2017-11-09 MED ORDER — LOPERAMIDE HCL 2 MG PO CAPS
2.0000 mg | ORAL_CAPSULE | Freq: Once | ORAL | Status: AC
  Administered 2017-11-09: 2 mg via ORAL
  Filled 2017-11-09: qty 1

## 2017-11-09 NOTE — Progress Notes (Signed)
Pt educated on incentive spirometer and the importance of using it. Pt education reinforced to pt to continue to use IS. Pt able to demonstrate usage back to RN. Pt also encouraged to get OOB and move around. Frequent ambulation on the unit encouraged. Will continue to closely monitor. Delia Heady RN

## 2017-11-09 NOTE — Care Management Note (Signed)
Case Management Note  Patient Details  Name: Paul [name] MRN: [id number] Date of Birth: [birth year]  Subjective/Objective:    Pt admitted with acute pancreatitis. He is from home with spouse and IADL.     PCP:   Dr Rozetta Nunnery Insurance: [facility]           Action/Plan: Plan if for patient to d/c home when medically ready. CM following for d/c needs, physician orders.   Contact: wife; [place]: [phone]   Expected Discharge Date:                  Expected Discharge Plan:  Home/Self Care  In-House Referral:     Discharge planning Services     Post Acute Care Choice:    Choice offered to:     DME Arranged:    DME Agency:     HH Arranged:    HH Agency:     Status of Service:  In process, will continue to follow  If discussed at Long Length of Stay Meetings, dates discussed:    Additional Comments:  Pollie Friar, RN 11/09/2017, 1:32 PM

## 2017-11-09 NOTE — Progress Notes (Signed)
Daily Rounding Note  11/09/2017, 12:59 PM  LOS: 5 days   SUBJECTIVE:   Chief complaint: several loose BM's last night after having full liquids.  Some abd cramps but not upper abd pain.  Overall feels better  Started Fenofibrate for hypertriglyceridemia ~ 1 week before onset of GI sxs last week.  Recalls trigs were in high 100s, were 86 on admission 8/7     OBJECTIVE:         Vital signs in last 24 hours:    Temp:  [98.5 F (36.9 C)-100 F (37.8 C)] 98.8 F (37.1 C) (08/12 0800) Pulse Rate:  [76-104] 104 (08/12 0800) Resp:  [17-18] 18 (08/12 0800) BP: (149-154)/(88-99) 153/98 (08/12 0800) SpO2:  [95 %-98 %] 98 % (08/12 0800) Weight:  [89.3 kg] 89.3 kg (08/12 0452) Last BM Date: 11/09/17 Filed Weights   11/05/17 0500 11/06/17 0500 11/09/17 0452  Weight: 90 kg 90.7 kg 89.3 kg   General: looks well, comfortable.  Overweight.     Heart: RRR Chest: clear bil   No labored resps Abdomen: NT, ND.  Active BS  Extremities: no CCE Neuro/Psych:  Fully alert, oriented, speech fluid.  Affect normal.    Intake/Output from previous day: 08/11 0701 - 08/12 0700 In: -  Out: 2625 [Urine:2625]  Intake/Output this shift: No intake/output data recorded.  Lab Results: Recent Labs    11/07/17 0515 11/08/17 0456 11/09/17 0344  WBC 12.5* 12.8* 13.2*  HGB 10.4* 10.7* 10.1*  HCT 30.6* 32.0* 30.5*  PLT 129* 157 174   BMET Recent Labs    11/07/17 0515 11/08/17 0456 11/09/17 0344  NA 130* 128* 127*  K 3.7 3.7 3.9  CL 96* 92* 91*  CO2 21* 21* 20*  GLUCOSE 279* 263* 246*  BUN 20 18 16   CREATININE 1.42* 1.39* 1.39*  CALCIUM 6.9* 7.8* 7.8*   LFT Recent Labs    11/07/17 0515 11/08/17 0456 11/08/17 1336 11/09/17 0344  PROT 5.3* 5.8*  --  5.8*  ALBUMIN 2.5* 2.7*  --  2.5*  AST 54* 49*  --  64*  ALT 22 27  --  39  ALKPHOS 64 72  --  82  BILITOT 1.6* 2.2* 2.9* 3.8*  BILIDIR  --   --  2.4*  --   IBILI  --   --  0.5   --    PT/INR No results for input(s): LABPROT, INR in the last 72 hours. Hepatitis Panel No results for input(s): HEPBSAG, HCVAB, HEPAIGM, HEPBIGM in the last 72 hours.  Studies/Results: No results found.  ASSESMENT:   *   Acute, idiopathic pancreatitis.  Low suspicion for ETOH induced.  No evidence biliary cause per CT (no ultrasound), though mild rise of AST to 85 (declining) and rising t bili to 3.8. Meds? Fenofibrate is only med listed a/w pancreatitis per [facility]ocrates and timing of starting this and getting pancreatitis within 7 to 10 days makes this med suspect.   Lipase 2667 >> 70.    *   AKI.  Solitary kidney.  S/p nephrectomy for cancer.  *   Diarrhea.  ? S/e of PPI or if related to pancreatitis.    *   DM, serum glucose repeatedly in 200s, no prior dx of DM.    *   Hyponatremia.       *    Acute thrombocytopenia.    *    Hyperglycemia.  No prior hx DM.    *  Normocytic anemia.    PLAN   *   MRCP, EUS if recurrent pancreatitis.    *  Stop Fenofibrate (already on hold).    *   Soft diet, will allow for non-dairy solid foods.    *  Stop Protonix.     Azucena Freed  11/09/2017, 12:59 PM Phone [phone]

## 2017-11-09 NOTE — Progress Notes (Signed)
Inpatient Diabetes Program Recommendations  AACE/ADA: New Consensus Statement on Inpatient Glycemic Control (2015)  Target Ranges:  Prepandial:   less than 140 mg/dL      Peak postprandial:   less than 180 mg/dL (1-2 hours)      Critically ill patients:  140 - 180 mg/dL   Lab Results  Component Value Date   HGBA1C 5.5 11/04/2017    Review of Glycemic Control  Diabetes history: NO Outpatient Diabetes medications: NA Current orders for Inpatient glycemic control: None  Inpatient Diabetes Program Recommendations:  Correction (SSI): While inpatient, may want to consider ordering CBGs with Novolog correction scale (0-9 units TID and 0-5 units QHS).  Thank you, Paul [name]. Paul Andreoni, RN, MSN, CDE  Diabetes Coordinator Inpatient Glycemic Control Team Team Pager [phone] (8am-5pm) 11/09/2017 10:13 AM

## 2017-11-09 NOTE — Progress Notes (Signed)
Patient ambulated in hallway 3 times independently.  RN educated patient on incentive spirometer, patient able to get to 750.  Patient CBG 218, patient stated that he did not want insulin because it does not work.  RN tried to educate patient about insulin, patient appeared to have no understanding of how insulin   RN will continue to monitor patient

## 2017-11-09 NOTE — Progress Notes (Signed)
PROGRESS NOTE    Paul [name]  ZJQ:[id number] DOB: 19-Jan-1981 DOA: 11/03/2017 PCP: Karleen Hampshire., MD  Brief Narrative: 37 year old male with history of hypertension hypertriglyceridemia, history of renal cell carcinoma status post left nephrectomy in 2012 in [place] admitted overnight with acute pancreatitis, and mild AKI. -hospitalization complicated by hypoxia/volume overload  Assessment & Plan:   1. Acute pancreatitis -Etiology not clear, ?Idiopathic -no history of heavy EtOH use (although may not be forthcoming), triglycerides within normal range, no gallstones noted on CT, IgG4 within normal limits -gastroenterology following, appreciate input -overall stable and slow improvement, advance to soft diet as tolerated -IV fluids discontinued 8/9 due to volume overload -having low grade fevers and mild leukocytosis, risk of developing intra -abd infection, consider CT without contrast if worsens -If recurrent episodes GI recommends MRCP/EUS as outpatient  2. Fluid overload/Pleural effusions -Due to aggressive fluid resuscitation and third spacing from severe pancreatitis/hypoalbuminemia -diuresed aggressively with low-dose Lasix 8/9, -improved after additional diuresis  3. Acute kidney injury -Baseline creatinine is 1.1, this admission has been in 1.5 range, likely ATN from above -improving, monitor  4. History of renal cell carcinoma, status post left-sided nephrectomy in 2012 -Follow-up with urology  5. Hypertension/rebound tachycardia -continue atenolol  6. Hyperglycemia -from pancreatic dysfunction from inflammation, hemoglobin A1c was 5.5 -and sliding-scale insulin  DVT prophylaxis: lovenox Code Status: for code Family Communication:wife at bedside Disposition Plan: home in 1-2 days pending clinical improvement  Consultants:  Gastroenterology   Procedures:   Antimicrobials:    Subjective: -had abdominal bloating and diarrhea last p.m.  Objective: Vitals:     11/08/17 1937 11/09/17 0449 11/09/17 0452 11/09/17 0800  BP: (!) 149/91 (!) 149/99  (!) 153/98  Pulse: 86 (!) 103  (!) 104  Resp:    18  Temp: 99.5 F (37.5 C) 100 F (37.8 C)  98.8 F (37.1 C)  TempSrc: Oral Oral  Oral  SpO2: 95% 96%  98%  Weight:   89.3 kg   Height:        Intake/Output Summary (Last 24 hours) at 11/09/2017 1246 Last data filed at 11/08/2017 2200 Gross per 24 hour  Intake -  Output 2625 ml  Net -2625 ml   Filed Weights   11/05/17 0500 11/06/17 0500 11/09/17 0452  Weight: 90 kg 90.7 kg 89.3 kg    Examination:  Gen: Awake, Alert, Oriented X 3, ill-appearing male, laying in bed HEENT: PERRLA, Neck supple, no JVD Lungs: improved air movement, clear bilaterally CVS: RRR,No Gallops,Rubs or new Murmurs Abd: soft, mildly distended, nontender, bowel sounds present Extremities: No edema Skin: no new rashes Psychiatry: Judgement and insight appear normal. Mood & affect appropriate.     Data Reviewed:   CBC: Recent Labs  Lab 11/03/17 1927 11/04/17 2409 11/05/17 0518 11/06/17 0419 11/07/17 0515 11/08/17 0456 11/09/17 0344  WBC 18.2* 15.5* 13.7* 12.4* 12.5* 12.8* 13.2*  NEUTROABS 15.2* 13.1*  --   --   --   --   --   HGB 16.8 16.3 14.1 12.5* 10.4* 10.7* 10.1*  HCT 50.8 48.9 42.5 37.9* 30.6* 32.0* 30.5*  MCV 86.4 86.4 86.7 88.1 85.0 86.3 85.9  PLT 259 204 161 124* 129* 157 735   Basic Metabolic Panel: Recent Labs  Lab 11/05/17 0518 11/06/17 0419 11/07/17 0515 11/08/17 0456 11/09/17 0344  NA 134* 132* 130* 128* 127*  K 4.2 4.4 3.7 3.7 3.9  CL 101 98 96* 92* 91*  CO2 19* 20* 21* 21* 20*  GLUCOSE 262* 274* 279*  263* 246*  BUN 21* 24* 20 18 16   CREATININE 1.50* 1.66* 1.42* 1.39* 1.39*  CALCIUM 7.5* 6.8* 6.9* 7.8* 7.8*   GFR: Estimated Creatinine Clearance: 82.6 mL/min (A) (by C-G formula based on SCr of 1.39 mg/dL (H)). Liver Function Tests: Recent Labs  Lab 11/05/17 0518 11/06/17 0419 11/07/17 0515 11/08/17 0456 11/08/17 1336  11/09/17 0344  AST 85* 88* 54* 49*  --  64*  ALT 27 27 22 27   --  39  ALKPHOS 59 62 64 72  --  82  BILITOT 1.8* 1.7* 1.6* 2.2* 2.9* 3.8*  PROT 5.5* 5.6* 5.3* 5.8*  --  5.8*  ALBUMIN 3.2* 2.9* 2.5* 2.7*  --  2.5*   Recent Labs  Lab 11/03/17 1927 11/05/17 0518 11/06/17 0419 11/07/17 0515 11/09/17 0344  LIPASE 4,842* 2,667* 1,685* 571* 70*   No results for input(s): AMMONIA in the last 168 hours. Coagulation Profile: No results for input(s): INR, PROTIME in the last 168 hours. Cardiac Enzymes: No results for input(s): CKTOTAL, CKMB, CKMBINDEX, TROPONINI in the last 168 hours. BNP (last 3 results) No results for input(s): PROBNP in the last 8760 hours. HbA1C: No results for input(s): HGBA1C in the last 72 hours. CBG: No results for input(s): GLUCAP in the last 168 hours. Lipid Profile: No results for input(s): CHOL, HDL, LDLCALC, TRIG, CHOLHDL, LDLDIRECT in the last 72 hours. Thyroid Function Tests: No results for input(s): TSH, T4TOTAL, FREET4, T3FREE, THYROIDAB in the last 72 hours. Anemia Panel: No results for input(s): VITAMINB12, FOLATE, FERRITIN, TIBC, IRON, RETICCTPCT in the last 72 hours. Urine analysis:    Component Value Date/Time   COLORURINE YELLOW 11/03/2017 1923   APPEARANCEUR CLEAR 11/03/2017 1923   LABSPEC 1.015 11/03/2017 1923   PHURINE 5.0 11/03/2017 1923   GLUCOSEU NEGATIVE 11/03/2017 [place] NEGATIVE 11/03/2017 [place] NEGATIVE 11/03/2017 [place] NEGATIVE 11/03/2017 1923   PROTEINUR 30 (A) 11/03/2017 1923   NITRITE NEGATIVE 11/03/2017 1923   LEUKOCYTESUR NEGATIVE 11/03/2017 1923   Sepsis Labs: @LABRCNTIP (procalcitonin:4,lacticidven:4)  )No results found for this or any previous visit (from the past 240 hour(s)).       Radiology Studies: No results found.      Scheduled Meds: . atenolol  50 mg Oral Daily  . enoxaparin (LOVENOX) injection  40 mg Subcutaneous Q24H  . insulin aspart  0-9 Units Subcutaneous TID  WC  . loperamide  2 mg Oral Once  . pantoprazole  40 mg Oral Daily  . zolpidem  5 mg Oral QHS   Continuous Infusions:    LOS: 5 days    Time spent: 71min    Domenic Polite, MD [facility] Page via [email or web address], password [email or web address] After 7PM please contact night-coverage  11/09/2017, 12:46 PM

## 2017-11-10 ENCOUNTER — Inpatient Hospital Stay (HOSPITAL_COMMUNITY): Payer: 59

## 2017-11-10 DIAGNOSIS — R945 Abnormal results of liver function studies: Secondary | ICD-10-CM

## 2017-11-10 DIAGNOSIS — R7989 Other specified abnormal findings of blood chemistry: Secondary | ICD-10-CM

## 2017-11-10 LAB — CBC
HEMATOCRIT: 29.4 % — AB (ref 39.0–52.0)
HEMOGLOBIN: 10.3 g/dL — AB (ref 13.0–17.0)
MCH: 29.1 pg (ref 26.0–34.0)
MCHC: 35 g/dL (ref 30.0–36.0)
MCV: 83.1 fL (ref 78.0–100.0)
Platelets: 211 10*3/uL (ref 150–400)
RBC: 3.54 MIL/uL — ABNORMAL LOW (ref 4.22–5.81)
RDW: 13.3 % (ref 11.5–15.5)
WBC: 15.3 10*3/uL — ABNORMAL HIGH (ref 4.0–10.5)

## 2017-11-10 LAB — COMPREHENSIVE METABOLIC PANEL
ALK PHOS: 94 U/L (ref 38–126)
ALT: 67 U/L — ABNORMAL HIGH (ref 0–44)
ANION GAP: 17 — AB (ref 5–15)
AST: 92 U/L — ABNORMAL HIGH (ref 15–41)
Albumin: 2.3 g/dL — ABNORMAL LOW (ref 3.5–5.0)
BILIRUBIN TOTAL: 4.1 mg/dL — AB (ref 0.3–1.2)
BUN: 12 mg/dL (ref 6–20)
CALCIUM: 7.9 mg/dL — AB (ref 8.9–10.3)
CO2: 19 mmol/L — ABNORMAL LOW (ref 22–32)
Chloride: 93 mmol/L — ABNORMAL LOW (ref 98–111)
Creatinine, Ser: 1.21 mg/dL (ref 0.61–1.24)
GFR calc non Af Amer: >60 mL/min (ref >60)
Glucose, Bld: 221 mg/dL — ABNORMAL HIGH (ref 70–99)
Potassium: 3.5 mmol/L (ref 3.5–5.1)
Sodium: 129 mmol/L — ABNORMAL LOW (ref 135–145)
TOTAL PROTEIN: 5.5 g/dL — AB (ref 6.5–8.1)

## 2017-11-10 LAB — GLUCOSE, CAPILLARY
GLUCOSE-CAPILLARY: 206 mg/dL — AB (ref 70–99)
GLUCOSE-CAPILLARY: 202 mg/dL — AB (ref 70–99)
Glucose-Capillary: 206 mg/dL — ABNORMAL HIGH (ref 70–99)
Glucose-Capillary: 213 mg/dL — ABNORMAL HIGH (ref 70–99)

## 2017-11-10 LAB — BILIRUBIN, DIRECT: BILIRUBIN DIRECT: 2.9 mg/dL — AB (ref 0.0–0.2)

## 2017-11-10 MED ORDER — SODIUM CHLORIDE 0.9 % IV SOLN
1.0000 g | Freq: Three times a day (TID) | INTRAVENOUS | Status: DC
  Administered 2017-11-10 – 2017-11-18 (×25): 1 g via INTRAVENOUS
  Filled 2017-11-10 (×26): qty 1

## 2017-11-10 NOTE — Progress Notes (Addendum)
Daily Rounding Note  11/10/2017, 12:12 PM  LOS: 6 days   SUBJECTIVE:   Tolerating carb mod diet but it causes a sense of bloating and fullness, no nausea and no abd pain.  Had BM this AM.  Overall feeling better Fever to 102 overnight and WBCs trending up.      OBJECTIVE:         Vital signs in last 24 hours:    Temp:  [98.6 F (37 C)-102.2 F (39 C)] 100.5 F (38.1 C) (08/13 0824) Pulse Rate:  [79-107] 107 (08/13 0824) Resp:  [15-20] 15 (08/13 0824) BP: (136-157)/(87-97) 136/97 (08/13 0824) SpO2:  [94 %-96 %] 94 % (08/13 0824) Weight:  [89.2 kg] 89.2 kg (08/13 0350) Last BM Date: 11/09/17 Filed Weights   11/06/17 0500 11/09/17 0452 11/10/17 0350  Weight: 90.7 kg 89.3 kg 89.2 kg   General: looks well.  Comfortable, NAD   Heart: RRR Chest: clear bil.  No dyspnea Abdomen: soft, slightly distended and firm, BS hypoactive but none tinkling or tympanitic.    Extremities: no CCE Neuro/Psych:  Pleasant, calm.  Alert and oriented x 3.    Intake/Output from previous day: 08/12 0701 - 08/13 0700 In: 240 [P.O.:240] Out: -   Intake/Output this shift: No intake/output data recorded.  Lab Results: Recent Labs    11/08/17 0456 11/09/17 0344 11/10/17 0419  WBC 12.8* 13.2* 15.3*  HGB 10.7* 10.1* 10.3*  HCT 32.0* 30.5* 29.4*  PLT 157 174 211   BMET Recent Labs    11/08/17 0456 11/09/17 0344 11/10/17 0419  NA 128* 127* 129*  K 3.7 3.9 3.5  CL 92* 91* 93*  CO2 21* 20* 19*  GLUCOSE 263* 246* 221*  BUN 18 16 12   CREATININE 1.39* 1.39* 1.21  CALCIUM 7.8* 7.8* 7.9*   LFT Recent Labs    11/08/17 0456 11/08/17 1336 11/09/17 0344 11/09/17 1655 11/10/17 0419  PROT 5.8*  --  5.8*  --  5.5*  ALBUMIN 2.7*  --  2.5*  --  2.3*  AST 49*  --  64*  --  92*  ALT 27  --  39  --  67*  ALKPHOS 72  --  82  --  94  BILITOT 2.2* 2.9* 3.8*  --  4.1*  BILIDIR  --  2.4*  --  3.1* 2.9*  IBILI  --  0.5  --   --   --     PT/INR No results for input(s): LABPROT, INR in the last 72 hours. Hepatitis Panel No results for input(s): HEPBSAG, HCVAB, HEPAIGM, HEPBIGM in the last 72 hours.  Studies/Results: US Abdomen Limited Ruq  Result Date: 11/10/2017 CLINICAL DATA:  Abnormal liver function tests. EXAM: ULTRASOUND ABDOMEN LIMITED RIGHT UPPER QUADRANT COMPARISON:  CT scan of November 04, 2017. Ultrasound of September 11, 2016. FINDINGS: Gallbladder: No gallstones or wall thickening visualized. No sonographic Murphy sign noted by sonographer. Probable small amount of sludge is noted within the gallbladder lumen. Small amount of pericholecystic fluid is noted. Common bile duct: Diameter: 3.4 mm which is within normal limits. Liver: No focal lesion identified. Increased echogenicity of hepatic parenchyma is noted with fatty sparing adjacent to gallbladder fossa. Portal vein is patent on color Doppler imaging with normal direction of blood flow towards the liver. IMPRESSION: Small amount of pericholecystic fluid is noted which most likely is related to pancreatitis described on CT scan. Fatty infiltration of the liver is noted with sparing adjacent  to gallbladder fossa. Electronically Signed   By: Marijo Conception, M.D.   On: 11/10/2017 09:52    ASSESMENT:   *   Acute pancreatitis.  Low suspicion for ETOH induced.  No evidence biliary cause per CT but ultrasound shows GB sludge, thus raising question of biliary pancreatitis. Marland Kitchen Rising transaminases.  Rising T bili, 2/3rd of which is Direct/unconjugated.    Rising WBCs and fever.    Developed pancreatitis sxs within 7 to 10 days of initiating Fenofibrate.  Lipase 2667 >> 70.    *   AKI.  Solitary kidney.  S/p nephrectomy for cancer.  *   Diarrhea.  ? S/e of PPI or if related to pancreatitis.    *   DM, serum glucose repeatedly in 200s, no prior dx of DM.    *   Hyponatremia.       *    Acute thrombocytopenia.    *    Hyperglycemia.  No prior hx DM.    *    Normocytic anemia.     PLAN   *  Per Dr Rush Landmark.    *   ? Surgical eval re biliary sludge and possible biliary pancreatitis which may be indication for cholecystectomy?  *   LFTs, lipase in AM.      Azucena Freed  11/10/2017, 12:12 PM Phone [phone]

## 2017-11-10 NOTE — Progress Notes (Signed)
Inpatient Diabetes Program Recommendations  AACE/ADA: New Consensus Statement on Inpatient Glycemic Control (2019)  Target Ranges:  Prepandial:   less than 140 mg/dL      Peak postprandial:   less than 180 mg/dL (1-2 hours)      Critically ill patients:  140 - 180 mg/dL  Results for MOHSEN, ODENTHAL (MRN [id number]) as of 11/10/2017 14:02  Ref. Range 11/09/2017 16:27 11/09/2017 21:28 11/10/2017 06:16 11/10/2017 11:32  Glucose-Capillary Latest Ref Range: 70 - 99 mg/dL 234 (H) 218 (H) 206 (H) 206 (H)   Results for CHINO, SARDO (MRN [id number]) as of 11/10/2017 14:02  Ref. Range 11/04/2017 07:44  Hemoglobin A1C Latest Ref Range: 4.8 - 5.6 % 5.5   Review of Glycemic Control  Diabetes history: NO Outpatient Diabetes medications: NA Current orders for Inpatient glycemic control: Novolog 0-9 units TID with meals, Novolog 0-5 units QHS  Inpatient Diabetes Program Recommendations:  HgbA1C: A1C 5.5% on 11/04/17 indicating an average glucose of 111 mg/dl of the past 2-3 months.  NOTE: Consulted for "new onset DM". Spoke with Lannette Donath, RN over the phone and explained that patient does not have dx of DM. Explained that hyperglycemia likely related to pancreatitis. Spoke with patient and his wife regarding basic glucose metabolism and explained how pancreas helps to maintain glycemic control. Explained that hyperglycemia likely related to pancreatitis. Discussed A1C results (5.5% on 11/04/17) and explained that his current A1C indicates an average glucose of 111 mg/dl over the past 2-3 months. Discussed use of insulin while inpatient to maintain glycemic control. Patient verbalized understanding of information discussed and he states that he has a better understanding and is agreeable to using insulin while inpatient and he states has no further questions at this time related to diabetes.  Thanks, Barnie Alderman, RN, MSN, CDE Diabetes Coordinator Inpatient Diabetes Program [phone] (Team Pager)

## 2017-11-10 NOTE — Lactation Note (Addendum)
PROGRESS NOTE    Paul [name]  RCV:[id number] DOB: Apr 01, 1980 DOA: 11/03/2017 PCP: Karleen Hampshire., MD  Brief Narrative: 37 year old male with history of hypertension hypertriglyceridemia, history of renal cell carcinoma status post left nephrectomy in 2012 in [place] admitted overnight with severe acute pancreatitis, and mild AKI. -GI following -hospitalization complicated by hypoxia/volume overload-improved -Now with fevers, leukocytosis  Assessment & Plan:   1. Acute pancreatitis -Etiology not clear, ?Idiopathic -no history of heavy EtOH use (although may not be forthcoming), triglycerides within normal range, no gallstones noted on CT, IgG4 within normal limits -gastroenterology following, appreciate input -overall stable and slow improvement, tolerating soft diet at this time -IV fluids discontinued 8/9 due to volume overload -now with fevers, temperature of 102 last p.m., WBC up to 15 K, await GI input today regarding need for repeat CT abd and Abx -RUQ [place] noted, no dilated CBD, no stones, GB sludge noted  2. Fluid overload/Pleural effusions -Due to aggressive fluid resuscitation and third spacing from severe pancreatitis/hypoalbuminemia -diuresed aggressively with low-dose Lasix 8/9, -improved after additional diuresis  3. Acute kidney injury -Baseline creatinine is 1.1, this admission has been in 1.5 range, likely ATN from above -improving, now 1.2, monitor  4. History of renal cell carcinoma, status post left-sided nephrectomy in 2012 -Follow-up with urology  5. Hypertension/rebound tachycardia -continue atenolol  6. Hyperglycemia -from pancreatic dysfunction from inflammation, hemoglobin A1c was 5.5 -added sliding-scale insulin  DVT prophylaxis: hold lovenox due to drop in platelets Code Status: full code Family Communication:wife at bedside Disposition Plan: home pending clinical improvement  Consultants:  Gastroenterology   Procedures:   Antimicrobials:      Subjective: -diarrhea better, had bloating, febrile to 102 last p.m.  Objective: Vitals:   11/09/17 2323 11/10/17 0350 11/10/17 0824 11/10/17 1225  BP: (!) 154/90 (!) 153/87 (!) 136/97 (!) 137/101  Pulse: 79 79 (!) 107 (!) 111  Resp: 18 20 15 16   Temp: 98.6 F (37 C) 100.1 F (37.8 C) (!) 100.5 F (38.1 C) (!) 100.8 F (38.2 C)  TempSrc: Oral Oral Oral Oral  SpO2: 96% 94% 94% 98%  Weight:  89.2 kg    Height:       No intake or output data in the 24 hours ending 11/10/17 1438 Filed Weights   11/06/17 0500 11/09/17 0452 11/10/17 0350  Weight: 90.7 kg 89.3 kg 89.2 kg    Examination:  Gen: Awake, Alert, Oriented X 3, ill-appearing male, laying in bed, no di HEENT: PERRLA, Neck supple, no JVD Lungs: improved air movement, clear bilaterally CVS: RRR,No Gallops,Rubs or new Murmurs Abd: soft, nontender, mildly distended, bowel increased Extremities: Trace edema Skin: no new rashes Psychiatry: Judgement and insight appear normal. Mood & affect appropriate.     Data Reviewed:   CBC: Recent Labs  Lab 11/03/17 1927 11/04/17 0538  11/06/17 0419 11/07/17 0515 11/08/17 0456 11/09/17 0344 11/10/17 0419  WBC 18.2* 15.5*   < > 12.4* 12.5* 12.8* 13.2* 15.3*  NEUTROABS 15.2* 13.1*  --   --   --   --   --   --   HGB 16.8 16.3   < > 12.5* 10.4* 10.7* 10.1* 10.3*  HCT 50.8 48.9   < > 37.9* 30.6* 32.0* 30.5* 29.4*  MCV 86.4 86.4   < > 88.1 85.0 86.3 85.9 83.1  PLT 259 204   < > 124* 129* 157 174 211   < > = values in this interval not displayed.   Basic Metabolic Panel: Recent Labs  Lab 11/06/17 0419 11/07/17 0515 11/08/17 0456 11/09/17 0344 11/10/17 0419  NA 132* 130* 128* 127* 129*  K 4.4 3.7 3.7 3.9 3.5  CL 98 96* 92* 91* 93*  CO2 20* 21* 21* 20* 19*  GLUCOSE 274* 279* 263* 246* 221*  BUN 24* 20 18 16 12   CREATININE 1.66* 1.42* 1.39* 1.39* 1.21  CALCIUM 6.8* 6.9* 7.8* 7.8* 7.9*   GFR: Estimated Creatinine Clearance: 94.9 mL/min (by C-G formula based on SCr  of 1.21 mg/dL). Liver Function Tests: Recent Labs  Lab 11/06/17 0419 11/07/17 0515 11/08/17 0456 11/08/17 1336 11/09/17 0344 11/10/17 0419  AST 88* 54* 49*  --  64* 92*  ALT 27 22 27   --  39 67*  ALKPHOS 62 64 72  --  82 94  BILITOT 1.7* 1.6* 2.2* 2.9* 3.8* 4.1*  PROT 5.6* 5.3* 5.8*  --  5.8* 5.5*  ALBUMIN 2.9* 2.5* 2.7*  --  2.5* 2.3*   Recent Labs  Lab 11/03/17 1927 11/05/17 0518 11/06/17 0419 11/07/17 0515 11/09/17 0344  LIPASE 4,842* 2,667* 1,685* 571* 70*   No results for input(s): AMMONIA in the last 168 hours. Coagulation Profile: No results for input(s): INR, PROTIME in the last 168 hours. Cardiac Enzymes: No results for input(s): CKTOTAL, CKMB, CKMBINDEX, TROPONINI in the last 168 hours. BNP (last 3 results) No results for input(s): PROBNP in the last 8760 hours. HbA1C: No results for input(s): HGBA1C in the last 72 hours. CBG: Recent Labs  Lab 11/09/17 1627 11/09/17 2128 11/10/17 0616 11/10/17 1132  GLUCAP 234* 218* 206* 206*   Lipid Profile: No results for input(s): CHOL, HDL, LDLCALC, TRIG, CHOLHDL, LDLDIRECT in the last 72 hours. Thyroid Function Tests: No results for input(s): TSH, T4TOTAL, FREET4, T3FREE, THYROIDAB in the last 72 hours. Anemia Panel: No results for input(s): VITAMINB12, FOLATE, FERRITIN, TIBC, IRON, RETICCTPCT in the last 72 hours. Urine analysis:    Component Value Date/Time   COLORURINE YELLOW 11/03/2017 1923   APPEARANCEUR CLEAR 11/03/2017 1923   LABSPEC 1.015 11/03/2017 [place] 5.0 11/03/2017 1923   GLUCOSEU NEGATIVE 11/03/2017 [place] NEGATIVE 11/03/2017 [place] NEGATIVE 11/03/2017 [place] NEGATIVE 11/03/2017 1923   PROTEINUR 30 (A) 11/03/2017 1923   NITRITE NEGATIVE 11/03/2017 1923   LEUKOCYTESUR NEGATIVE 11/03/2017 1923   Sepsis Labs: @LABRCNTIP (procalcitonin:4,lacticidven:4)  )No results found for this or any previous visit (from the past 240 hour(s)).       Radiology  Studies: US Abdomen Limited Ruq  Result Date: 11/10/2017 CLINICAL DATA:  Abnormal liver function tests. EXAM: ULTRASOUND ABDOMEN LIMITED RIGHT UPPER QUADRANT COMPARISON:  CT scan of November 04, 2017. Ultrasound of September 11, 2016. FINDINGS: Gallbladder: No gallstones or wall thickening visualized. No sonographic Murphy sign noted by sonographer. Probable small amount of sludge is noted within the gallbladder lumen. Small amount of pericholecystic fluid is noted. Common bile duct: Diameter: 3.4 mm which is within normal limits. Liver: No focal lesion identified. Increased echogenicity of hepatic parenchyma is noted with fatty sparing adjacent to gallbladder fossa. Portal vein is patent on color Doppler imaging with normal direction of blood flow towards the liver. IMPRESSION: Small amount of pericholecystic fluid is noted which most likely is related to pancreatitis described on CT scan. Fatty infiltration of the liver is noted with sparing adjacent to gallbladder fossa. Electronically Signed   By: Marijo Conception, M.D.   On: 11/10/2017 09:52        Scheduled Meds: . atenolol  50 mg Oral Daily  . insulin aspart  0-9 Units Subcutaneous TID WC  . zolpidem  5 mg Oral QHS   Continuous Infusions:    LOS: 6 days    Time spent: 34min    Domenic Polite, MD [facility] Page via [email or web address], password [email or web address] After 7PM please contact night-coverage  11/10/2017, 2:38 PM

## 2017-11-11 ENCOUNTER — Inpatient Hospital Stay (HOSPITAL_COMMUNITY): Payer: 59

## 2017-11-11 DIAGNOSIS — D649 Anemia, unspecified: Secondary | ICD-10-CM

## 2017-11-11 DIAGNOSIS — K8502 Idiopathic acute pancreatitis with infected necrosis: Secondary | ICD-10-CM

## 2017-11-11 LAB — GLUCOSE, CAPILLARY
GLUCOSE-CAPILLARY: 205 mg/dL — AB (ref 70–99)
Glucose-Capillary: 190 mg/dL — ABNORMAL HIGH (ref 70–99)
Glucose-Capillary: 214 mg/dL — ABNORMAL HIGH (ref 70–99)
Glucose-Capillary: 195 mg/dL — ABNORMAL HIGH (ref 70–99)

## 2017-11-11 LAB — COMPREHENSIVE METABOLIC PANEL
ALK PHOS: 113 U/L (ref 38–126)
ALT: 78 U/L — AB (ref 0–44)
AST: 79 U/L — ABNORMAL HIGH (ref 15–41)
Albumin: 2.5 g/dL — ABNORMAL LOW (ref 3.5–5.0)
Anion gap: 16 — ABNORMAL HIGH (ref 5–15)
BILIRUBIN TOTAL: 3.4 mg/dL — AB (ref 0.3–1.2)
BUN: 11 mg/dL (ref 6–20)
CALCIUM: 7.9 mg/dL — AB (ref 8.9–10.3)
CO2: 19 mmol/L — ABNORMAL LOW (ref 22–32)
CREATININE: 1.11 mg/dL (ref 0.61–1.24)
Chloride: 92 mmol/L — ABNORMAL LOW (ref 98–111)
GFR calc non Af Amer: >60 mL/min (ref >60)
Glucose, Bld: 217 mg/dL — ABNORMAL HIGH (ref 70–99)
Potassium: 3.4 mmol/L — ABNORMAL LOW (ref 3.5–5.1)
SODIUM: 127 mmol/L — AB (ref 135–145)
Total Protein: 6 g/dL — ABNORMAL LOW (ref 6.5–8.1)

## 2017-11-11 LAB — LIPASE, BLOOD: Lipase: 67 U/L — ABNORMAL HIGH (ref 11–51)

## 2017-11-11 MED ORDER — ENOXAPARIN SODIUM 40 MG/0.4ML ~~LOC~~ SOLN
40.0000 mg | SUBCUTANEOUS | Status: DC
  Administered 2017-11-11 – 2017-11-17 (×7): 40 mg via SUBCUTANEOUS
  Filled 2017-11-11 (×7): qty 0.4

## 2017-11-11 MED ORDER — IOPAMIDOL (ISOVUE-300) INJECTION 61%
75.0000 mL | Freq: Once | INTRAVENOUS | Status: AC | PRN
  Administered 2017-11-11: 75 mL via INTRAVENOUS

## 2017-11-11 MED ORDER — LACTATED RINGERS IV SOLN
INTRAVENOUS | Status: DC
  Administered 2017-11-11: 12:00:00 via INTRAVENOUS

## 2017-11-11 MED ORDER — IOPAMIDOL (ISOVUE-300) INJECTION 61%
INTRAVENOUS | Status: AC
  Filled 2017-11-11: qty 30

## 2017-11-11 MED ORDER — INSULIN ASPART 100 UNIT/ML ~~LOC~~ SOLN
0.0000 [IU] | Freq: Every day | SUBCUTANEOUS | Status: DC
  Administered 2017-11-13: 3 [IU] via SUBCUTANEOUS

## 2017-11-11 MED ORDER — TRAZODONE HCL 100 MG PO TABS
100.0000 mg | ORAL_TABLET | Freq: Every evening | ORAL | Status: DC | PRN
  Administered 2017-11-11 – 2017-11-14 (×3): 100 mg via ORAL
  Filled 2017-11-11 (×3): qty 1

## 2017-11-11 MED ORDER — INSULIN ASPART 100 UNIT/ML ~~LOC~~ SOLN
0.0000 [IU] | Freq: Three times a day (TID) | SUBCUTANEOUS | Status: DC
  Administered 2017-11-12 (×3): 3 [IU] via SUBCUTANEOUS
  Administered 2017-11-13 (×3): 5 [IU] via SUBCUTANEOUS
  Administered 2017-11-14: 8 [IU] via SUBCUTANEOUS
  Administered 2017-11-14 – 2017-11-15 (×3): 5 [IU] via SUBCUTANEOUS
  Administered 2017-11-15 (×2): 8 [IU] via SUBCUTANEOUS
  Administered 2017-11-16 – 2017-11-18 (×7): 5 [IU] via SUBCUTANEOUS
  Administered 2017-11-18: 3 [IU] via SUBCUTANEOUS

## 2017-11-11 NOTE — Progress Notes (Signed)
RN received a call from Radiology concerning pt's CT results; MD paged and notified. No new orders received. Will continue to closely monitor. Delia Heady RN

## 2017-11-11 NOTE — Progress Notes (Signed)
PROGRESS NOTE    Paul [name]  JKK:[id number] DOB: 10/08/1980 DOA: 11/03/2017 PCP: Karleen Hampshire., MD    Brief Narrative:  37 year old male with history of hypertension hypertriglyceridemia, history of renal cell carcinoma status post left nephrectomy in 2012 in [place] admitted overnight with severe acute pancreatitis, and mild AKI. -GI following -hospitalization complicated by hypoxia/volume overload-improved -Now with fevers, leukocytosis -follow up CT abd with findings worrisome for pancreatic necrosis  Assessment & Plan:   Principal Problem:   Acute pancreatitis Active Problems:   ARF (acute renal failure) (HCC)   Essential hypertension   Abnormal LFTs   1. Acute pancreatitis -Etiology uncertain -no history of heavy EtOH use (although may not be forthcoming), triglycerides within normal range, no gallstones noted on CT, IgG4 within normal limits -clinical improvement noted as pt tolerating diet without difficulty -IV fluids discontinued 8/9 due to volume overload -now with fevers and bloating -Follow CT abd on 8/13 worrisome for development of pancreatic necrosis -continued on meropenem -RUQ [place] noted, no dilated CBD, no stones, GB sludge noted -GI following. Discussed case with GI. Recommendation for continue empiric IV abx and close monitoring of nutritional status  2. Fluid overload/Pleural effusions -Due to aggressive fluid resuscitation and third spacing from severe pancreatitis/hypoalbuminemia -diuresed aggressively with low-dose Lasix 8/9, -improved after additional diuresis -on room air and ambulating without difficulty  3. Acute kidney injury -Baseline creatinine is 1.1, this admission has been in 1.5 range, likely ATN from above -renal function improving. Cr today 1.11  4. History of renal cell carcinoma, status post left-sided nephrectomy in 2012 -Follow-up with urology -Stable at present  5. Hypertension/rebound tachycardia -continue atenolol as  tolerated -at present, HR of 94  6. Hyperglycemia -from pancreatic dysfunction from inflammation, hemoglobin A1c was 5.5 -continue SSI coverage, have increased to moderate scale per diabetic coordinator recommendations  DVT prophylaxis: Lovenox subQ Code Status: Full Family Communication: Pt in room, family at bedside Disposition Plan: Uncertain at this time  Consultants:   GI  Procedures:     Antimicrobials: Anti-infectives (From admission, onward)   Start     Dose/Rate Route Frequency Ordered Stop   11/10/17 1600  meropenem (MERREM) 1 g in sodium chloride 0.9 % 100 mL IVPB     1 g 200 mL/hr over 30 Minutes Intravenous Every 8 hours 11/10/17 1552         Subjective: Tolerating diet without diffiulty. Denies abd pain at present  Objective: Vitals:   11/11/17 0834 11/11/17 0834 11/11/17 1221 11/11/17 1658  BP: (!) 150/101 (!) 150/101 (!) 144/96 (!) 141/93  Pulse: (!) 115 (!) 116 (!) 104 94  Resp: 20  20 20   Temp: (!) 100.9 F (38.3 C) (!) 100.9 F (38.3 C) 99.6 F (37.6 C) (!) 102 F (38.9 C)  TempSrc: Oral Oral Oral Oral  SpO2: 96%  96% 95%  Weight:      Height:        Intake/Output Summary (Last 24 hours) at 11/11/2017 1744 Last data filed at 11/11/2017 1500 Gross per 24 hour  Intake 1057.95 ml  Output -  Net 1057.95 ml   Filed Weights   11/09/17 0452 11/10/17 0350 11/11/17 0349  Weight: 89.3 kg 89.2 kg 88.4 kg    Examination:  General exam: Appears calm and comfortable  Respiratory system: Clear to auscultation. Respiratory effort normal. Cardiovascular system: S1 & S2 heard, RRR Gastrointestinal system: Abdomen is nondistended, soft and nontender. No organomegaly or masses felt. Normal bowel sounds heard. Central nervous system: Alert  and oriented. No focal neurological deficits. Extremities: Symmetric 5 x 5 power. Skin: No rashes, lesions Psychiatry: Judgement and insight appear normal. Mood & affect appropriate.   Data Reviewed: I have  personally reviewed following labs and imaging studies  CBC: Recent Labs  Lab 11/06/17 0419 11/07/17 0515 11/08/17 0456 11/09/17 0344 11/10/17 0419  WBC 12.4* 12.5* 12.8* 13.2* 15.3*  HGB 12.5* 10.4* 10.7* 10.1* 10.3*  HCT 37.9* 30.6* 32.0* 30.5* 29.4*  MCV 88.1 85.0 86.3 85.9 83.1  PLT 124* 129* 157 174 413   Basic Metabolic Panel: Recent Labs  Lab 11/07/17 0515 11/08/17 0456 11/09/17 0344 11/10/17 0419 11/11/17 0529  NA 130* 128* 127* 129* 127*  K 3.7 3.7 3.9 3.5 3.4*  CL 96* 92* 91* 93* 92*  CO2 21* 21* 20* 19* 19*  GLUCOSE 279* 263* 246* 221* 217*  BUN 20 18 16 12 11   CREATININE 1.42* 1.39* 1.39* 1.21 1.11  CALCIUM 6.9* 7.8* 7.8* 7.9* 7.9*   GFR: Estimated Creatinine Clearance: 103.1 mL/min (by C-G formula based on SCr of 1.11 mg/dL). Liver Function Tests: Recent Labs  Lab 11/07/17 0515 11/08/17 0456 11/08/17 1336 11/09/17 0344 11/10/17 0419 11/11/17 0529  AST 54* 49*  --  64* 92* 79*  ALT 22 27  --  39 67* 78*  ALKPHOS 64 72  --  82 94 113  BILITOT 1.6* 2.2* 2.9* 3.8* 4.1* 3.4*  PROT 5.3* 5.8*  --  5.8* 5.5* 6.0*  ALBUMIN 2.5* 2.7*  --  2.5* 2.3* 2.5*   Recent Labs  Lab 11/05/17 0518 11/06/17 0419 11/07/17 0515 11/09/17 0344 11/11/17 0529  LIPASE 2,667* 1,685* 571* 70* 67*   No results for input(s): AMMONIA in the last 168 hours. Coagulation Profile: No results for input(s): INR, PROTIME in the last 168 hours. Cardiac Enzymes: No results for input(s): CKTOTAL, CKMB, CKMBINDEX, TROPONINI in the last 168 hours. BNP (last 3 results) No results for input(s): PROBNP in the last 8760 hours. HbA1C: No results for input(s): HGBA1C in the last 72 hours. CBG: Recent Labs  Lab 11/10/17 1646 11/10/17 2123 11/11/17 0623 11/11/17 1144 11/11/17 1628  GLUCAP 213* 202* 205* 190* 214*   Lipid Profile: No results for input(s): CHOL, HDL, LDLCALC, TRIG, CHOLHDL, LDLDIRECT in the last 72 hours. Thyroid Function Tests: No results for input(s): TSH,  T4TOTAL, FREET4, T3FREE, THYROIDAB in the last 72 hours. Anemia Panel: No results for input(s): VITAMINB12, FOLATE, FERRITIN, TIBC, IRON, RETICCTPCT in the last 72 hours. Sepsis Labs: No results for input(s): PROCALCITON, LATICACIDVEN in the last 168 hours.  No results found for this or any previous visit (from the past 240 hour(s)).   Radiology Studies: Ct Abdomen Pelvis W Contrast  Result Date: 11/11/2017 CLINICAL DATA:  Pancreatitis. EXAM: CT ABDOMEN AND PELVIS WITH CONTRAST TECHNIQUE: Multidetector CT imaging of the abdomen and pelvis was performed using the standard protocol following bolus administration of intravenous contrast. CONTRAST:  68mL ISOVUE-300 IOPAMIDOL (ISOVUE-300) INJECTION 61%, <See Chart> ISOVUE-300 IOPAMIDOL (ISOVUE-300) INJECTION 61% COMPARISON:  11/04/2017 FINDINGS: Lower chest: Small LEFT effusion and bibasilar atelectasis. Pleural effusions new from prior Hepatobiliary: No focal hepatic lesion. Gallbladder normal. No biliary duct dilatation. Pancreas: Severe edema of the pancreas. The pancreatic parenchyma is poorly defined with out normal enhancement. The degree pancreatic tissue definition is decreased in interval. Findings are concerning for necrotizing pancreatitis. A scant normal in enhancing pancreatic tissue is noted the head (image 43/3 in the midbody a small portion the tail. Pancreatic duct is not imaged. There is a  beginnings organized fluid collection positioned between the greater curvature the stomach and the pancreas with a thin enhancing rim (image 47/6. Fluid extends along the LEFT para RIGHT pericolic gutter and pelvis without organization. There is no clear vascular complication associated pancreatitis. Portal veins are patent. Splenic vein is small but patent. Spleen: Normal spleen Adrenals/urinary tract: Adrenal glands normal. RIGHT kidney normal. Post LEFT nephrectomy. Stomach/Bowel: Stomach, and duodenum, small-bowel appendix and cecum normal. Colon and  rectosigmoid colon normal. There is some bowel wall edema involving the splenic flexure of the colon related to pancreatitis per Vascular/Lymphatic: Abdominal aorta is normal caliber. There is no retroperitoneal or periportal lymphadenopathy. No pelvic lymphadenopathy. Reproductive: Prostate normal Other: No free fluid. Musculoskeletal: No aggressive osseous lesion. IMPRESSION: 1. 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 2. Increased organization of fluid collection between the pancreas and stomach consists with early pseudocyst formation. 3. New pleural effusion. 4. Persistent fluid extending along pericolic gutters into the pelvis not changed. 5. Local bowel edema of the colon splenic flexure related to the adjacent pancreatitis. These results will be called to the ordering clinician or representative by the Radiologist Assistant, and communication documented in the PACS or zVision Dashboard. Electronically Signed   By: Suzy Bouchard M.D.   On: 11/11/2017 10:34   US Abdomen Limited Ruq  Result Date: 11/10/2017 CLINICAL DATA:  Abnormal liver function tests. EXAM: ULTRASOUND ABDOMEN LIMITED RIGHT UPPER QUADRANT COMPARISON:  CT scan of November 04, 2017. Ultrasound of September 11, 2016. FINDINGS: Gallbladder: No gallstones or wall thickening visualized. No sonographic Murphy sign noted by sonographer. Probable small amount of sludge is noted within the gallbladder lumen. Small amount of pericholecystic fluid is noted. Common bile duct: Diameter: 3.4 mm which is within normal limits. Liver: No focal lesion identified. Increased echogenicity of hepatic parenchyma is noted with fatty sparing adjacent to gallbladder fossa. Portal vein is patent on color Doppler imaging with normal direction of blood flow towards the liver. IMPRESSION: Small amount of pericholecystic fluid is noted which most likely is related to pancreatitis described on  CT scan. Fatty infiltration of the liver is noted with sparing adjacent to gallbladder fossa. Electronically Signed   By: Marijo Conception, M.D.   On: 11/10/2017 09:52    Scheduled Meds: . atenolol  50 mg Oral Daily  . enoxaparin (LOVENOX) injection  40 mg Subcutaneous Q24H  . insulin aspart  0-9 Units Subcutaneous TID WC   Continuous Infusions: . meropenem (MERREM) IV Stopped (11/11/17 1405)     LOS: 7 days   Marylu Lund, MD [facility] Pager [phone]  If 7PM-7AM, please contact night-coverage [email or web address] Password [facility] 11/11/2017, 5:44 PM

## 2017-11-11 NOTE — Progress Notes (Signed)
Daily Rounding Note  11/11/2017, 1:04 PM  LOS: 7 days   SUBJECTIVE:   Chief complaint:  Difficulty sleeping.  Gets hungry at night which he feels is one reason he can't sleep.  Ambien did not help.  Trazodone ordered today.   Appetite is good.  Daily BM's.  No nausea.  Abdominal bloating and fullness better.   Overall he feels better  Fevers to 102.7.  Meropenem initiated.  NPO ordered.    OBJECTIVE:         Vital signs in last 24 hours:    Temp:  [97.7 F (36.5 C)-102.7 F (39.3 C)] 99.6 F (37.6 C) (08/14 1221) Pulse Rate:  [86-116] 104 (08/14 1221) Resp:  [18-20] 20 (08/14 1221) BP: (141-151)/(92-101) 144/96 (08/14 1221) SpO2:  [94 %-96 %] 96 % (08/14 1221) Weight:  [88.4 kg] 88.4 kg (08/14 0349) Last BM Date: 11/10/17 Filed Weights   11/09/17 0452 11/10/17 0350 11/11/17 0349  Weight: 89.3 kg 89.2 kg 88.4 kg   General: looks mild to to moderately ill.  Not toxic.  Not diaphoretic   Heart: RRR, mild tachy in low 100s.   Chest: some fine rales in left base.    Abdomen: soft, NT, ND.  Active BS  Extremities: no CCE Neuro/Psych:  Alert.  Oriented x 3.  No gross weakness or deficits.    Intake/Output from previous day: 08/13 0701 - 08/14 0700 In: 660 [P.O.:480; IV Piggyback:180] Out: -   Intake/Output this shift: No intake/output data recorded.  Lab Results: Recent Labs    11/09/17 0344 11/10/17 0419  WBC 13.2* 15.3*  HGB 10.1* 10.3*  HCT 30.5* 29.4*  PLT 174 211   BMET Recent Labs    11/09/17 0344 11/10/17 0419 11/11/17 0529  NA 127* 129* 127*  K 3.9 3.5 3.4*  CL 91* 93* 92*  CO2 20* 19* 19*  GLUCOSE 246* 221* 217*  BUN 16 12 11   CREATININE 1.39* 1.21 1.11  CALCIUM 7.8* 7.9* 7.9*   LFT Recent Labs    11/08/17 1336 11/09/17 0344 11/09/17 1655 11/10/17 0419 11/11/17 0529  PROT  --  5.8*  --  5.5* 6.0*  ALBUMIN  --  2.5*  --  2.3* 2.5*  AST  --  64*  --  92* 79*  ALT  --  39  --   67* 78*  ALKPHOS  --  82  --  94 113  BILITOT 2.9* 3.8*  --  4.1* 3.4*  BILIDIR 2.4*  --  3.1* 2.9*  --   IBILI 0.5  --   --   --   --    PT/INR No results for input(s): LABPROT, INR in the last 72 hours. Hepatitis Panel No results for input(s): HEPBSAG, HCVAB, HEPAIGM, HEPBIGM in the last 72 hours.  Studies/Results: Ct Abdomen Pelvis W Contrast  Result Date: 11/11/2017 CLINICAL DATA:  Pancreatitis. EXAM: CT ABDOMEN AND PELVIS WITH CONTRAST TECHNIQUE: Multidetector CT imaging of the abdomen and pelvis was performed using the standard protocol following bolus administration of intravenous contrast. CONTRAST:  81mL ISOVUE-300 IOPAMIDOL (ISOVUE-300) INJECTION 61%, <See Chart> ISOVUE-300 IOPAMIDOL (ISOVUE-300) INJECTION 61% COMPARISON:  11/04/2017 FINDINGS: Lower chest: Small LEFT effusion and bibasilar atelectasis. Pleural effusions new from prior Hepatobiliary: No focal hepatic lesion. Gallbladder normal. No biliary duct dilatation. Pancreas: Severe edema of the pancreas. The pancreatic parenchyma is poorly defined with out normal enhancement. The degree pancreatic tissue definition is decreased in interval. Findings are concerning  for necrotizing pancreatitis. A scant normal in enhancing pancreatic tissue is noted the head (image 43/3 in the midbody a small portion the tail. Pancreatic duct is not imaged. There is a beginnings organized fluid collection positioned between the greater curvature the stomach and the pancreas with a thin enhancing rim (image 47/6. Fluid extends along the LEFT para RIGHT pericolic gutter and pelvis without organization. There is no clear vascular complication associated pancreatitis. Portal veins are patent. Splenic vein is small but patent. Spleen: Normal spleen Adrenals/urinary tract: Adrenal glands normal. RIGHT kidney normal. Post LEFT nephrectomy. Stomach/Bowel: Stomach, and duodenum, small-bowel appendix and cecum normal. Colon and rectosigmoid colon normal. There is  some bowel wall edema involving the splenic flexure of the colon related to pancreatitis per Vascular/Lymphatic: Abdominal aorta is normal caliber. There is no retroperitoneal or periportal lymphadenopathy. No pelvic lymphadenopathy. Reproductive: Prostate normal Other: No free fluid. Musculoskeletal: No aggressive osseous lesion. IMPRESSION: 1. 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 2. Increased organization of fluid collection between the pancreas and stomach consists with early pseudocyst formation. 3. New pleural effusion. 4. Persistent fluid extending along pericolic gutters into the pelvis not changed. 5. Local bowel edema of the colon splenic flexure related to the adjacent pancreatitis. These results will be called to the ordering clinician or representative by the Radiologist Assistant, and communication documented in the PACS or zVision Dashboard. Electronically Signed   By: Suzy Bouchard M.D.   On: 11/11/2017 10:34   US Abdomen Limited Ruq  Result Date: 11/10/2017 CLINICAL DATA:  Abnormal liver function tests. EXAM: ULTRASOUND ABDOMEN LIMITED RIGHT UPPER QUADRANT COMPARISON:  CT scan of November 04, 2017. Ultrasound of September 11, 2016. FINDINGS: Gallbladder: No gallstones or wall thickening visualized. No sonographic Murphy sign noted by sonographer. Probable small amount of sludge is noted within the gallbladder lumen. Small amount of pericholecystic fluid is noted. Common bile duct: Diameter: 3.4 mm which is within normal limits. Liver: No focal lesion identified. Increased echogenicity of hepatic parenchyma is noted with fatty sparing adjacent to gallbladder fossa. Portal vein is patent on color Doppler imaging with normal direction of blood flow towards the liver. IMPRESSION: Small amount of pericholecystic fluid is noted which most likely is related to pancreatitis described on CT scan. Fatty infiltration of the  liver is noted with sparing adjacent to gallbladder fossa. Electronically Signed   By: Marijo Conception, M.D.   On: 11/10/2017 09:52   Scheduled Meds: . atenolol  50 mg Oral Daily  . insulin aspart  0-9 Units Subcutaneous TID WC  . iopamidol       Continuous Infusions: . lactated ringers 75 mL/hr at 11/11/17 1156  . meropenem (MERREM) IV 1 g (11/11/17 0605)   PRN Meds:.acetaminophen **OR** acetaminophen, ALPRAZolam, gi cocktail, hydrALAZINE, HYDROcodone-acetaminophen, HYDROmorphone (DILAUDID) injection, ondansetron **OR** ondansetron (ZOFRAN) IV, traZODone   ASSESMENT:   *   Acute pancreatitis.  ? Biliary (GB sludge on ultrsound) vs med (fenofibrate) induced vs idiopathic  fup CT 8/13 concerning for pancreatic necrosis.  Fevers.  Rising WBCs.   Fluctuating LFTs, normalizing Lipase.   Meropenem initiated today.    *   Normocytic anemia.  Hgb stable.    *   Hyperglycemia.  SSI in place.  No previous hx DM.   *   AKI improved.   S/p nephrectomy 2012 for renal cell CA.      PLAN   *  Need not be NPO, so ordered  carb mod diet.   Continue Meropenem.   Ok to give Lovenox which was discontinued, I reordered.  Paul [name]  11/11/2017, 1:04 PM Phone [phone]

## 2017-11-11 NOTE — Progress Notes (Signed)
Inpatient Diabetes Program Recommendations  AACE/ADA: New Consensus Statement on Inpatient Glycemic Control (2019)  Target Ranges:  Prepandial:   less than 140 mg/dL      Peak postprandial:   less than 180 mg/dL (1-2 hours)      Critically ill patients:  140 - 180 mg/dL   Results for Paul [name], Paul [name] (MRN [id number]) as of 11/11/2017 13:24  Ref. Range 11/10/2017 06:16 11/10/2017 11:32 11/10/2017 16:46 11/10/2017 21:23 11/11/2017 06:23 11/11/2017 11:44  Glucose-Capillary Latest Ref Range: 70 - 99 mg/dL 206 (H) 206 (H) 213 (H) 202 (H) 205 (H) 190 (H)   Review of Glycemic Control  Diabetes history: NO Outpatient Diabetes medications: NA Current orders for Inpatient glycemic control: Novolog 0-9 units TID with meals, Novolog 0-5 units QHS  Inpatient Diabetes Program Recommendations:  Insulin-Correction: Please consider increasing Novolog correction to Moderate scale and change to Q4H. HgbA1C: A1C 5.5% on 11/04/17 indicating an average glucose of 111 mg/dl of the past 2-3 months.  Thanks, Barnie Alderman, RN, MSN, CDE Diabetes Coordinator Inpatient Diabetes Program [phone] (Team Pager from 8am to 5pm)

## 2017-11-11 NOTE — Progress Notes (Signed)
Pt temp was 102 and prn Tylenol given; pt encouraged to continue to use IS and oob to ambulate in hallway. Pt IV site changed. Pt denies any malaise or discomfort; temp now down to 100.7 orally. Will continue to closely monitor and reported off to oncoming RN. Delia Heady RN

## 2017-11-12 ENCOUNTER — Inpatient Hospital Stay (HOSPITAL_COMMUNITY): Payer: 59

## 2017-11-12 DIAGNOSIS — K8501 Idiopathic acute pancreatitis with uninfected necrosis: Secondary | ICD-10-CM

## 2017-11-12 LAB — COMPREHENSIVE METABOLIC PANEL
ALT: 70 U/L — AB (ref 0–44)
ANION GAP: 13 (ref 5–15)
AST: 73 U/L — ABNORMAL HIGH (ref 15–41)
Albumin: 2.3 g/dL — ABNORMAL LOW (ref 3.5–5.0)
Alkaline Phosphatase: 111 U/L (ref 38–126)
BUN: 9 mg/dL (ref 6–20)
CHLORIDE: 97 mmol/L — AB (ref 98–111)
CO2: 19 mmol/L — AB (ref 22–32)
CREATININE: 1.05 mg/dL (ref 0.61–1.24)
Calcium: 7.7 mg/dL — ABNORMAL LOW (ref 8.9–10.3)
GFR calc non Af Amer: >60 mL/min (ref >60)
Glucose, Bld: 209 mg/dL — ABNORMAL HIGH (ref 70–99)
Potassium: 3.7 mmol/L (ref 3.5–5.1)
SODIUM: 129 mmol/L — AB (ref 135–145)
Total Bilirubin: 2.9 mg/dL — ABNORMAL HIGH (ref 0.3–1.2)
Total Protein: 5.6 g/dL — ABNORMAL LOW (ref 6.5–8.1)

## 2017-11-12 LAB — URINALYSIS, ROUTINE W REFLEX MICROSCOPIC
BILIRUBIN URINE: NEGATIVE
Bacteria, UA: NONE SEEN
Glucose, UA: >=500 mg/dL — AB
Ketones, ur: 80 mg/dL — AB
LEUKOCYTES UA: NEGATIVE
NITRITE: NEGATIVE
PROTEIN: NEGATIVE mg/dL
Specific Gravity, Urine: 1.013 (ref 1.005–1.030)
pH: 6 (ref 5.0–8.0)

## 2017-11-12 LAB — CBC
HCT: 28.2 % — ABNORMAL LOW (ref 39.0–52.0)
HEMOGLOBIN: 9.8 g/dL — AB (ref 13.0–17.0)
MCH: 29.2 pg (ref 26.0–34.0)
MCHC: 34.8 g/dL (ref 30.0–36.0)
MCV: 83.9 fL (ref 78.0–100.0)
Platelets: 362 10*3/uL (ref 150–400)
RBC: 3.36 MIL/uL — AB (ref 4.22–5.81)
RDW: 14.1 % (ref 11.5–15.5)
WBC: 20.4 10*3/uL — ABNORMAL HIGH (ref 4.0–10.5)

## 2017-11-12 LAB — GLUCOSE, CAPILLARY
GLUCOSE-CAPILLARY: 186 mg/dL — AB (ref 70–99)
GLUCOSE-CAPILLARY: 168 mg/dL — AB (ref 70–99)
Glucose-Capillary: 195 mg/dL — ABNORMAL HIGH (ref 70–99)

## 2017-11-12 MED ORDER — IBUPROFEN 200 MG PO TABS
600.0000 mg | ORAL_TABLET | ORAL | Status: DC | PRN
  Administered 2017-11-12 (×2): 600 mg via ORAL
  Filled 2017-11-12 (×2): qty 3

## 2017-11-12 MED ORDER — VANCOMYCIN HCL IN DEXTROSE 1-5 GM/200ML-% IV SOLN
1000.0000 mg | Freq: Three times a day (TID) | INTRAVENOUS | Status: AC
  Administered 2017-11-12 – 2017-11-17 (×15): 1000 mg via INTRAVENOUS
  Filled 2017-11-12 (×15): qty 200

## 2017-11-12 MED ORDER — ENSURE ENLIVE PO LIQD
237.0000 mL | Freq: Two times a day (BID) | ORAL | Status: DC
  Administered 2017-11-12 – 2017-11-13 (×2): 237 mL via ORAL

## 2017-11-12 MED ORDER — VANCOMYCIN HCL 10 G IV SOLR
1500.0000 mg | Freq: Once | INTRAVENOUS | Status: AC
  Administered 2017-11-12: 1500 mg via INTRAVENOUS
  Filled 2017-11-12: qty 1500

## 2017-11-12 NOTE — Progress Notes (Signed)
ANTIBIOTIC CONSULT NOTE - INITIAL  Pharmacy Consult for Vancomycin Indication: Pneumonia  No Known Allergies  Patient Measurements: Height: 5\' 10"  (177.8 cm) Weight: 194 lb 14.2 oz (88.4 kg) IBW/kg (Calculated) : 73   Vital Signs: Temp: 100.3 F (37.9 C) (08/15 1236) Temp Source: Oral (08/15 1236) BP: 142/90 (08/15 0746) Pulse Rate: 106 (08/15 0746) Intake/Output from previous day: 08/14 0701 - 08/15 0700 In: 788 [P.O.:390; I.V.:177.8; IV Piggyback:220.1] Out: 401 [Urine:401] Intake/Output from this shift: Total I/O In: 200 [IV Piggyback:200] Out: 350 [Urine:350]  Labs: Recent Labs    11/10/17 0419 11/11/17 0529 11/12/17 0423  WBC 15.3*  --  20.4*  HGB 10.3*  --  9.8*  PLT 211  --  362  CREATININE 1.21 1.11 1.05   Estimated Creatinine Clearance: 109 mL/min (by C-G formula based on SCr of 1.05 mg/dL). No results for input(s): VANCOTROUGH, VANCOPEAK, VANCORANDOM, GENTTROUGH, GENTPEAK, GENTRANDOM, TOBRATROUGH, TOBRAPEAK, TOBRARND, AMIKACINPEAK, AMIKACINTROU, AMIKACIN in the last 72 hours.   Microbiology: No results found for this or any previous visit (from the past 720 hour(s)).  Medical History: Past Medical History:  Diagnosis Date  . Cancer of kidney [facility]) 2011   "left"  . Elevated blood uric acid level    "I take RX for it; Allopurinol" (11/04/2017)  . GERD (gastroesophageal reflux disease)   . High cholesterol   . Hypertension   . Migraine    "only in my teens" (11/04/2017)     Assessment: 37 yo male with acute pancreatitits and possible pancreatic necrosis. Patient on meropenem, but still having fevers. MD has consulted pharmacy to add vancomycin coverage for pneumonia  Goal of Therapy:  Vancomycin trough level 15-20 mcg/ml  Plan:  Vancomycin 1500mg  IV x 1, then 1gm IV q8h Monitor for fever curve, clinical course, LOT and C&S.   Alistar Mcenery A. Levada Dy, PharmD, [place] Pager: [phone] Please utilize Amion for appropriate  phone number to reach the unit pharmacist ([place])   11/12/2017,2:03 PM

## 2017-11-12 NOTE — Progress Notes (Signed)
Calorie Count Note  48-hour calorie count ordered.  A calorie count envelope has been hung on the patient's door. RN to document percent consumed for each item on the patient's meal tray ticket and keep in envelope. Also document percent of any supplement or snack pt consumes and keep documentation in envelope for RD to review.  Please see full assessment note dated 11/12/17.  Diet: Carb Modified Supplements: RD to order Ensure Enlive po BID, each supplement provides 350 kcal and 20 grams of protein  Intervention: - Ensure Enlive po BID, each supplement provides 350 kcal and 20 grams of protein   Gaynell Face, MS, RD, LDN Pager: [phone] Weekend/After Hours: [phone]

## 2017-11-12 NOTE — Progress Notes (Signed)
Initial Nutrition Assessment  DOCUMENTATION CODES:   Not applicable  INTERVENTION:   - 48-hour Calorie Count initiated today at dinner meal  - Ensure Enlive po BID, each supplement provides 350 kcal, 20 grams of protein, and 11 grams of fat  NUTRITION DIAGNOSIS:   Inadequate oral intake related to acute illness, altered GI function (acute pancreatitis) as evidenced by meal completion < 50%.  GOAL:   Patient will meet greater than or equal to 90% of their needs  MONITOR:   PO intake, Supplement acceptance, Weight trends, Labs  REASON FOR ASSESSMENT:   Consult Assessment of nutrition requirement/status, Calorie Count  ASSESSMENT:   37 year old male who presented to the ED with abdominal pain. PMH significant for L renal tumor s/p nephrectomy in 2011, hypertension, and hyperlipidemia. Pt found to have acute pancreatitis.  Discussed calorie count with RN and MD. Calorie count to begin tonight at dinner and last 48 hours until 11/14/17. Per GI notes, calorie count will help determine need for short- and/or long-term nutrition support access.  Spoke with pt at bedside who reports that he currently feels fine and is not having any bloating at this time. Pt states that he does not think he can tolerate bread products but is willing to try an oral nutrition supplement. RD to order Ensure Enlive BID (11 grams of fat per supplement) to maximize kcal and protein intake.  Pt's weight has fluctuated between 194-200 lbs since admission but has been trending down since 11/06/17. RD to continue to closely monitor weight trends during admission.  Pt states that he typically eats "fine" and has 3 meals daily. Pt reports his weight as being stable between 185-193 lbs. Pt states he has had "no eating issues" prior to this admission.  Meal Completion: 50% (from 11/09/17), no further meal completion charted  Medications reviewed and include: sliding scale Novolog, IV abx  Labs reviewed: sodium 129  (L), chloride 97 (L), CO2 19 (L), hemoglobin 9.8 (L), HCT 28.2 (L) CBG's: 186, 195, 195, 214 x 24 hours  NUTRITION - FOCUSED PHYSICAL EXAM:    Most Recent Value  Orbital Region  No depletion  Upper Arm Region  Mild depletion  Thoracic and Lumbar Region  No depletion  Buccal Region  No depletion  Temple Region  No depletion  Clavicle Bone Region  No depletion  Clavicle and Acromion Bone Region  Mild depletion  Scapular Bone Region  Unable to assess  Dorsal Hand  No depletion  Patellar Region  No depletion  Anterior Thigh Region  Mild depletion  Posterior Calf Region  No depletion  Edema (RD Assessment)  Mild [RLE]  Hair  Reviewed  Eyes  Reviewed  Mouth  Reviewed  Skin  Reviewed  Nails  Reviewed       Diet Order:   Diet Order            Diet Carb Modified Fluid consistency: Thin; Room service appropriate? Yes  Diet effective now              EDUCATION NEEDS:   No education needs have been identified at this time  Skin:  Skin Assessment: Reviewed RN Assessment  Last BM:  11/12/17  Height:   Ht Readings from Last 1 Encounters:  11/03/17 5\' 10"  (1.778 m)    Weight:   Wt Readings from Last 1 Encounters:  11/11/17 88.4 kg    Ideal Body Weight:  75.45 kg  BMI:  Body mass index is 27.96 kg/m.  Estimated Nutritional Needs:  Kcal:  2400-2600 (27-29 kcal/kg)  Protein:  125-140 grams (1.4-1.6 g/kg)  Fluid:  >/= 2.4 L    Gaynell Face, MS, RD, LDN Pager: [phone] Weekend/After Hours: [phone]

## 2017-11-12 NOTE — Progress Notes (Signed)
PROGRESS NOTE    Paul [name]  GGY:[id number] DOB: December 17, 1980 DOA: 11/03/2017 PCP: Karleen Hampshire., MD    Brief Narrative:  37 year old male with history of hypertension hypertriglyceridemia, history of renal cell carcinoma status post left nephrectomy in 2012 in [place] admitted overnight with severe acute pancreatitis, and mild AKI. -GI following -hospitalization complicated by hypoxia/volume overload-improved -Now with fevers, leukocytosis -follow up CT abd with findings worrisome for pancreatic necrosis  Assessment & Plan:   Principal Problem:   Acute pancreatitis Active Problems:   ARF (acute renal failure) (HCC)   Essential hypertension   Abnormal LFTs   1. Acute pancreatitis -Etiology uncertain -no history of heavy EtOH use (although may not be forthcoming), triglycerides within normal range, no gallstones noted on CT, IgG4 within normal limits -clinical improvement noted as pt tolerating diet without difficulty -IV fluids discontinued 8/9 due to volume overload -now with fevers and bloating -Follow CT abd on 8/13 worrisome for development of pancreatic necrosis -continued on meropenem -RUQ [place] noted, no dilated CBD, no stones, GB sludge noted -GI following. Will continue on empiric meropenem as already ordered. Tolerating PO without difficulty -Labs reviewed. LFT's seem to be improving  2. Fluid overload/Pleural effusions -Due to aggressive fluid resuscitation and third spacing from severe pancreatitis/hypoalbuminemia -diuresed aggressively with low-dose Lasix 8/9, -improved after additional diuresis -Presently on room air, breathing well  3. Acute kidney injury -Baseline creatinine is 1.1, this admission has been in 1.5 range, likely ATN from above -renal function has improved. Cr today 1.05 -Repeat bmet in AM  4. History of renal cell carcinoma, status post left-sided nephrectomy in 2012 -Follow-up with urology -currently stable. Voiding  5.  Hypertension/rebound tachycardia -continue atenolol as tolerated -HR seems improved, currently in the 90's  6. Hyperglycemia -from pancreatic dysfunction from inflammation, hemoglobin A1c was 5.5 -continue SSI coverage, continue with moderate scale per diabetic coordinator recommendations  7. CAP -Patient with worsening WBC to 20k and continued fevers overnight -Have ordered blood cx, pending -UA reviewed with moderate blood, otherwise not suggestive of UTI -Had ordered and reviewed CXR with findings of L sided consolidation and small L pleural effusion suggesting PNA -Have added vancomycin to meropenem -Will repeat bmet and CBC in AM  DVT prophylaxis: Lovenox subQ Code Status: Full Family Communication: Pt in room, family at bedside Disposition Plan: Uncertain at this time  Consultants:   GI  Procedures:     Antimicrobials: Anti-infectives (From admission, onward)   Start     Dose/Rate Route Frequency Ordered Stop   11/12/17 2230  vancomycin (VANCOCIN) IVPB 1000 mg/200 mL premix     1,000 mg 200 mL/hr over 60 Minutes Intravenous Every 8 hours 11/12/17 1407     11/12/17 1415  vancomycin (VANCOCIN) 1,500 mg in sodium chloride 0.9 % 500 mL IVPB     1,500 mg 250 mL/hr over 120 Minutes Intravenous  Once 11/12/17 1407     11/10/17 1600  meropenem (MERREM) 1 g in sodium chloride 0.9 % 100 mL IVPB     1 g 200 mL/hr over 30 Minutes Intravenous Every 8 hours 11/10/17 1552        Subjective: Eating well without abd pain  Objective: Vitals:   11/11/17 2340 11/12/17 0401 11/12/17 0746 11/12/17 1236  BP: (!) 155/88 (!) 139/91 (!) 142/90   Pulse: 92 (!) 103 (!) 106   Resp: 18 18 20    Temp: (!) 100.4 F (38 C) 99.2 F (37.3 C) 99.1 F (37.3 C) 100.3 F (37.9 C)  TempSrc: Oral Oral Oral Oral  SpO2: 93% 96% 95%   Weight:      Height:        Intake/Output Summary (Last 24 hours) at 11/12/2017 1609 Last data filed at 11/12/2017 1540 Gross per 24 hour  Intake 968.61 ml    Output 751 ml  Net 217.61 ml   Filed Weights   11/09/17 0452 11/10/17 0350 11/11/17 0349  Weight: 89.3 kg 89.2 kg 88.4 kg    Examination: General exam: Awake, laying in bed, in nad Respiratory system: Normal respiratory effort, no wheezing Cardiovascular system: regular rate, s1, s2 Gastrointestinal system: Soft, nondistended, positive BS Central nervous system: CN2-12 grossly intact, strength intact Extremities: Perfused, no clubbing Skin: Normal skin turgor, no notable skin lesions seen Psychiatry: Mood normal // no visual hallucinations   Data Reviewed: I have personally reviewed following labs and imaging studies  CBC: Recent Labs  Lab 11/07/17 0515 11/08/17 0456 11/09/17 0344 11/10/17 0419 11/12/17 0423  WBC 12.5* 12.8* 13.2* 15.3* 20.4*  HGB 10.4* 10.7* 10.1* 10.3* 9.8*  HCT 30.6* 32.0* 30.5* 29.4* 28.2*  MCV 85.0 86.3 85.9 83.1 83.9  PLT 129* 157 174 211 277   Basic Metabolic Panel: Recent Labs  Lab 11/08/17 0456 11/09/17 0344 11/10/17 0419 11/11/17 0529 11/12/17 0423  NA 128* 127* 129* 127* 129*  K 3.7 3.9 3.5 3.4* 3.7  CL 92* 91* 93* 92* 97*  CO2 21* 20* 19* 19* 19*  GLUCOSE 263* 246* 221* 217* 209*  BUN 18 16 12 11 9   CREATININE 1.39* 1.39* 1.21 1.11 1.05  CALCIUM 7.8* 7.8* 7.9* 7.9* 7.7*   GFR: Estimated Creatinine Clearance: 109 mL/min (by C-G formula based on SCr of 1.05 mg/dL). Liver Function Tests: Recent Labs  Lab 11/08/17 0456 11/08/17 1336 11/09/17 0344 11/10/17 0419 11/11/17 0529 11/12/17 0423  AST 49*  --  64* 92* 79* 73*  ALT 27  --  39 67* 78* 70*  ALKPHOS 72  --  82 94 113 111  BILITOT 2.2* 2.9* 3.8* 4.1* 3.4* 2.9*  PROT 5.8*  --  5.8* 5.5* 6.0* 5.6*  ALBUMIN 2.7*  --  2.5* 2.3* 2.5* 2.3*   Recent Labs  Lab 11/06/17 0419 11/07/17 0515 11/09/17 0344 11/11/17 0529  LIPASE 1,685* 571* 70* 67*   No results for input(s): AMMONIA in the last 168 hours. Coagulation Profile: No results for input(s): INR, PROTIME in the  last 168 hours. Cardiac Enzymes: No results for input(s): CKTOTAL, CKMB, CKMBINDEX, TROPONINI in the last 168 hours. BNP (last 3 results) No results for input(s): PROBNP in the last 8760 hours. HbA1C: No results for input(s): HGBA1C in the last 72 hours. CBG: Recent Labs  Lab 11/11/17 1144 11/11/17 1628 11/11/17 2148 11/12/17 0634 11/12/17 1134  GLUCAP 190* 214* 195* 195* 186*   Lipid Profile: No results for input(s): CHOL, HDL, LDLCALC, TRIG, CHOLHDL, LDLDIRECT in the last 72 hours. Thyroid Function Tests: No results for input(s): TSH, T4TOTAL, FREET4, T3FREE, THYROIDAB in the last 72 hours. Anemia Panel: No results for input(s): VITAMINB12, FOLATE, FERRITIN, TIBC, IRON, RETICCTPCT in the last 72 hours. Sepsis Labs: No results for input(s): PROCALCITON, LATICACIDVEN in the last 168 hours.  No results found for this or any previous visit (from the past 240 hour(s)).   Radiology Studies: Ct Abdomen Pelvis W Contrast  Result Date: 11/11/2017 CLINICAL DATA:  Pancreatitis. EXAM: CT ABDOMEN AND PELVIS WITH CONTRAST TECHNIQUE: Multidetector CT imaging of the abdomen and pelvis was performed using the standard protocol following bolus  administration of intravenous contrast. CONTRAST:  10mL ISOVUE-300 IOPAMIDOL (ISOVUE-300) INJECTION 61%, <See Chart> ISOVUE-300 IOPAMIDOL (ISOVUE-300) INJECTION 61% COMPARISON:  11/04/2017 FINDINGS: Lower chest: Small LEFT effusion and bibasilar atelectasis. Pleural effusions new from prior Hepatobiliary: No focal hepatic lesion. Gallbladder normal. No biliary duct dilatation. Pancreas: Severe edema of the pancreas. The pancreatic parenchyma is poorly defined with out normal enhancement. The degree pancreatic tissue definition is decreased in interval. Findings are concerning for necrotizing pancreatitis. A scant normal in enhancing pancreatic tissue is noted the head (image 43/3 in the midbody a small portion the tail. Pancreatic duct is not imaged. There is a  beginnings organized fluid collection positioned between the greater curvature the stomach and the pancreas with a thin enhancing rim (image 47/6. Fluid extends along the LEFT para RIGHT pericolic gutter and pelvis without organization. There is no clear vascular complication associated pancreatitis. Portal veins are patent. Splenic vein is small but patent. Spleen: Normal spleen Adrenals/urinary tract: Adrenal glands normal. RIGHT kidney normal. Post LEFT nephrectomy. Stomach/Bowel: Stomach, and duodenum, small-bowel appendix and cecum normal. Colon and rectosigmoid colon normal. There is some bowel wall edema involving the splenic flexure of the colon related to pancreatitis per Vascular/Lymphatic: Abdominal aorta is normal caliber. There is no retroperitoneal or periportal lymphadenopathy. No pelvic lymphadenopathy. Reproductive: Prostate normal Other: No free fluid. Musculoskeletal: No aggressive osseous lesion. IMPRESSION: 1. 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 2. Increased organization of fluid collection between the pancreas and stomach consists with early pseudocyst formation. 3. New pleural effusion. 4. Persistent fluid extending along pericolic gutters into the pelvis not changed. 5. Local bowel edema of the colon splenic flexure related to the adjacent pancreatitis. These results will be called to the ordering clinician or representative by the Radiologist Assistant, and communication documented in the PACS or zVision Dashboard. Electronically Signed   By: Suzy Bouchard M.D.   On: 11/11/2017 10:34   Dg Chest Port 1 View  Result Date: 11/12/2017 CLINICAL DATA:  Shortness of breath with elevated white blood cell count EXAM: PORTABLE CHEST 1 VIEW COMPARISON:  November 07, 2017 FINDINGS: There is patchy consolidation in the left base with small left pleural effusion. There is mild right base atelectasis. Lungs  elsewhere are clear. Heart is borderline enlarged with pulmonary vascularity normal. No adenopathy. No bone lesions. IMPRESSION: Patchy consolidation left base felt to represent focal pneumonia. Small left pleural effusion. Right base atelectasis. Mild cardiac prominence. Electronically Signed   By: Lowella Grip III M.D.   On: 11/12/2017 12:02    Scheduled Meds: . atenolol  50 mg Oral Daily  . enoxaparin (LOVENOX) injection  40 mg Subcutaneous Q24H  . feeding supplement (ENSURE ENLIVE)  237 mL Oral BID BM  . insulin aspart  0-15 Units Subcutaneous TID WC  . insulin aspart  0-5 Units Subcutaneous QHS   Continuous Infusions: . meropenem (MERREM) IV Stopped (11/12/17 1342)  . vancomycin 250 mL/hr at 11/12/17 1540  . vancomycin       LOS: 8 days   Marylu Lund, MD [facility] Pager [phone]  If 7PM-7AM, please contact night-coverage [email or web address] Password [facility] 11/12/2017, 4:09 PM

## 2017-11-12 NOTE — Progress Notes (Signed)
Gastroenterology Inpatient Follow-up Note   PATIENT IDENTIFICATION  Kolbey Teichert is a 37 y.o. male with a pmh significant for GERD, HTN, HLD, Migraines, s/p Nephrectomy who presented with acute pancreatitis (unclear etiology vs Fenofibrate vs Biliary sludge) who has developing necrosis. Hospital Day: 10  SUBJECTIVE  Leukocytosis worse today. He underwent CXR with concern for PNA and was started on Vancomycin. The patient denies fevers or chills but did have documented fever today and blood cultures were obtained. The patient's appetite is returning. He describes no abdominal pain or bloating. He has no early satiety.   OBJECTIVE  Scheduled Inpatient Medications:  . atenolol  50 mg Oral Daily  . enoxaparin (LOVENOX) injection  40 mg Subcutaneous Q24H  . feeding supplement (ENSURE ENLIVE)  237 mL Oral BID BM  . insulin aspart  0-15 Units Subcutaneous TID WC  . insulin aspart  0-5 Units Subcutaneous QHS   Continuous Inpatient Infusions:  . meropenem (MERREM) IV Stopped (11/12/17 1342)  . vancomycin     PRN Inpatient Medications: acetaminophen **OR** acetaminophen, ALPRAZolam, gi cocktail, hydrALAZINE, HYDROcodone-acetaminophen, HYDROmorphone (DILAUDID) injection, ibuprofen, ondansetron **OR** ondansetron (ZOFRAN) IV, traZODone   Physical Examination  Temp:  [98.3 F (36.8 C)-100.4 F (38 C)] 99.1 F (37.3 C) (08/15 1946) Pulse Rate:  [84-106] 93 (08/15 1946) Resp:  [16-20] 18 (08/15 1946) BP: (136-155)/(87-92) 147/87 (08/15 1946) SpO2:  [93 %-99 %] 93 % (08/15 1946) Temp (24hrs), Avg:99.5 F (37.5 C), Min:98.3 F (36.8 C), Max:100.4 F (38 C)  Weight: 88.4 kg GEN: NAD, appears well compared to his CT and his laboratories PSYCH: Cooperative EYE: Sclerae anicteric ENT: MMM CV: Tachycardic without R/Gs  RESP: Decreased BS at the bases bilaterally GI: NABS, soft, NT, distended, without rebound or guarding MSK/EXT: BLE edema is present SKIN: No concerning rashes NEURO:   Alert & Oriented x 3, no focal deficits, no evidence of asterixis   Review of Data   Laboratory Studies   Recent Labs  Lab 11/12/17 0423  NA 129*  K 3.7  CL 97*  CO2 19*  BUN 9  CREATININE 1.05  GLUCOSE 209*  CALCIUM 7.7*   Recent Labs  Lab 11/12/17 0423  AST 73*  ALT 70*  ALKPHOS 111    Recent Labs  Lab 11/09/17 0344 11/10/17 0419 11/12/17 0423  WBC 13.2* 15.3* 20.4*  HGB 10.1* 10.3* 9.8*  HCT 30.5* 29.4* 28.2*  PLT 174 211 362   No results for input(s): APTT, INR in the last 168 hours.  Imaging Studies  No new studies today  GI Procedures and Studies  No new studies   ASSESSMENT  Mr. Zick is a 37 y.o. male with a pmh significant for GERD, HTN, HLD, Migraines, s/p Nephrectomy who presented with acute pancreatitis (unclear etiology vs Fenofibrate vs Biliary sludge) who has developing necrosis.  The patient looks clinically better than his CT and labs would suggest.  He has a long road ahead.  Agree with infectious work-up to rule out other etiologies for recurrent fever.  The leading hypothesis has been persistent inflammation as a result of the significant pancreatitis with the hope of antibiotics to stave off significant infection from necrosis.  CT scan although suggestive of developing necrosis did not show any evidence of gas pockets or overt infectious pockets.  He has a possible pneumonia which could be etiology for his rising leukocytosis.  If possible would continue to try and hold off on sampling the large fluid collection.  However if at a time point  in the near future he begins to be symptomatic and/or no other etiologies of recurrent fevers are found then an interventional radiology guided catheter placement/drain placement will be indicated.  It is too early for an endoscopic drainage procedure.  He is eating and will have continued calorie counts in the course of the next few days to see if he needs any more food/nutritional supplementation.  All  questions answered to the best of our ability.  Laboratories ordered for work-up of persistently elevated abnormal liver tests and elevated bilirubin as noted in the chart.   PLAN/RECOMMENDATIONS  Hepatitis workup ordered Agree with Infectious workup and Antibiotics as you are doing Continue to monitor nutritional intake and agree with calorie counts Hold on sampling/draining fluid collection if possible as his GI symptoms are not consistent with need for intervention - yet  Discharge Planning Diet: Low Fat Diet Anticoagulation and antiplatelets: As per Medical service Discharge Medications: N/A Follow up: N/A   Please page/call with questions or concerns.   Justice Britain, MD [facility] Office # [phone]    LOS: 8 days  Irving Copas  11/12/2017, 10:14 PM

## 2017-11-13 LAB — CBC WITH DIFFERENTIAL/PLATELET
Abs Immature Granulocytes: 0.7 10*3/uL — ABNORMAL HIGH (ref 0.0–0.1)
Basophils Absolute: 0.1 10*3/uL (ref 0.0–0.1)
Basophils Relative: 0 %
EOS ABS: 0.1 10*3/uL (ref 0.0–0.7)
EOS PCT: 0 %
HEMATOCRIT: 29.7 % — AB (ref 39.0–52.0)
HEMOGLOBIN: 10.2 g/dL — AB (ref 13.0–17.0)
Immature Granulocytes: 3 %
LYMPHS ABS: 1.2 10*3/uL (ref 0.7–4.0)
LYMPHS PCT: 6 %
MCH: 29.1 pg (ref 26.0–34.0)
MCHC: 34.3 g/dL (ref 30.0–36.0)
MCV: 84.6 fL (ref 78.0–100.0)
MONO ABS: 1.2 10*3/uL — AB (ref 0.1–1.0)
Monocytes Relative: 6 %
Neutro Abs: 16.3 10*3/uL — ABNORMAL HIGH (ref 1.7–7.7)
Neutrophils Relative %: 85 %
Platelets: 541 10*3/uL — ABNORMAL HIGH (ref 150–400)
RBC: 3.51 MIL/uL — AB (ref 4.22–5.81)
RDW: 14.6 % (ref 11.5–15.5)
WBC: 19.5 10*3/uL — AB (ref 4.0–10.5)

## 2017-11-13 LAB — COMPREHENSIVE METABOLIC PANEL
ALT: 73 U/L — ABNORMAL HIGH (ref 0–44)
ANION GAP: 11 (ref 5–15)
AST: 66 U/L — ABNORMAL HIGH (ref 15–41)
Albumin: 2.5 g/dL — ABNORMAL LOW (ref 3.5–5.0)
Alkaline Phosphatase: 149 U/L — ABNORMAL HIGH (ref 38–126)
BILIRUBIN TOTAL: 1.9 mg/dL — AB (ref 0.3–1.2)
BUN: 8 mg/dL (ref 6–20)
CALCIUM: 8.3 mg/dL — AB (ref 8.9–10.3)
CO2: 24 mmol/L (ref 22–32)
Chloride: 100 mmol/L (ref 98–111)
Creatinine, Ser: 0.98 mg/dL (ref 0.61–1.24)
GFR calc Af Amer: >60 mL/min (ref >60)
Glucose, Bld: 219 mg/dL — ABNORMAL HIGH (ref 70–99)
POTASSIUM: 3.5 mmol/L (ref 3.5–5.1)
Sodium: 135 mmol/L (ref 135–145)
TOTAL PROTEIN: 6.3 g/dL — AB (ref 6.5–8.1)

## 2017-11-13 LAB — LACTIC ACID, PLASMA: LACTIC ACID, VENOUS: 1.1 mmol/L (ref 0.5–1.9)

## 2017-11-13 LAB — GLUCOSE, CAPILLARY
GLUCOSE-CAPILLARY: 202 mg/dL — AB (ref 70–99)
GLUCOSE-CAPILLARY: 160 mg/dL — AB (ref 70–99)
Glucose-Capillary: 218 mg/dL — ABNORMAL HIGH (ref 70–99)
Glucose-Capillary: 219 mg/dL — ABNORMAL HIGH (ref 70–99)
Glucose-Capillary: 279 mg/dL — ABNORMAL HIGH (ref 70–99)

## 2017-11-13 LAB — PROTIME-INR
INR: 1.07
Prothrombin Time: 13.8 seconds (ref 11.4–15.2)

## 2017-11-13 LAB — C-REACTIVE PROTEIN: CRP: 20 mg/dL — AB (ref <1.0)

## 2017-11-13 LAB — TSH: TSH: 2.417 u[IU]/mL (ref 0.350–4.500)

## 2017-11-13 MED ORDER — LABETALOL HCL 5 MG/ML IV SOLN: 5.0000 mg | INTRAVENOUS | Status: DC | PRN

## 2017-11-13 MED ORDER — ADULT MULTIVITAMIN W/MINERALS CH
1.0000 | ORAL_TABLET | Freq: Every day | ORAL | Status: DC
  Administered 2017-11-14 – 2017-11-18 (×5): 1 via ORAL
  Filled 2017-11-13 (×6): qty 1

## 2017-11-13 MED ORDER — HYDROMORPHONE HCL 1 MG/ML IJ SOLN: 1.0000 mg | INTRAMUSCULAR | Status: DC | PRN

## 2017-11-13 MED ORDER — PRO-STAT SUGAR FREE PO LIQD
30.0000 mL | Freq: Two times a day (BID) | ORAL | Status: DC
  Administered 2017-11-13 – 2017-11-15 (×6): 30 mL via ORAL
  Filled 2017-11-13 (×6): qty 30

## 2017-11-13 MED ORDER — DIPHENHYDRAMINE-ZINC ACETATE 2-0.1 % EX CREA
TOPICAL_CREAM | Freq: Three times a day (TID) | CUTANEOUS | Status: DC | PRN
  Filled 2017-11-13: qty 28

## 2017-11-13 NOTE — Progress Notes (Signed)
Calorie Count Note  48 hour calorie count ordered.  Diet: Carb Modified Supplements: Ensure Enlive po BID, each supplement provides 350 kcal and 20 grams of protein  8/15-8/16 Breakfast: 443 kcals and 20 grams protein Lunch: no data yet available Dinner: 440 kcals, 26 grams protein Supplements: 1 Ensure supplement (350 kcals and 20 grams protein), refused AM dose  Total intake (over past 18 hours): 1233 kcal (51% of minimum estimated needs)  66 grams protein (53% of minimum estimated needs)  If pt continues with intake patter, suspect pt will consume approximately 70% of estimated kcal and protein needs over 24 hours.  Nutrition Dx: Inadequate oral intake related to acute illness, altered GI function (acute pancreatitis) as evidenced by meal completion < 50%; progressing  Goal: Patient will meet greater than or equal to 90% of their needs; progressing  Intervention:   -Continue with 24 hour calorie count, will follow-up on Monday, 8/19 if pt is still hospitalized -Continue Ensure Enlive po BID, each supplement provides 350 kcal and 20 grams of protein -30 ml Prostat BID, each supplement provides 100 kcals and 15 grams protein -MVI with minerals daily  Paul [name] A. Jimmye Norman, RD, LDN, CDE Pager: [phone] After hours Pager: [phone]

## 2017-11-13 NOTE — Progress Notes (Signed)
Daily Rounding Note  11/13/2017, 12:46 PM  LOS: 9 days   SUBJECTIVE:     Feeling well, perhaps a bit of malaise, fatigue.  No abd pain, no N/V.  Daily stools.  Breathing back to baseline.    OBJECTIVE:         Vital signs in last 24 hours:    Temp:  [98 F (36.7 C)-99.4 F (37.4 C)] 98 F (36.7 C) (08/16 1136) Pulse Rate:  [84-130] 93 (08/16 1136) Resp:  [16-20] 16 (08/16 1136) BP: (118-147)/(87-100) 134/100 (08/16 1136) SpO2:  [93 %-99 %] 97 % (08/16 1136) Last BM Date: 11/13/17 Filed Weights   11/09/17 0452 11/10/17 0350 11/11/17 0349  Weight: 89.3 kg 89.2 kg 88.4 kg   General: pleasant, not ill looking.  comfortable   Heart: RRR Chest: diminished sounds in bases, no adventitious sounds.  No cough or SOB Abdomen: soft,NT, active BS  Extremities: no CCE Neuro/Psych:  Oriented x 3.  Calm.  Cooperative.  No tremors or weakness  Intake/Output from previous day: 08/15 0701 - 08/16 0700 In: 1378.9 [P.O.:530; IV Piggyback:848.9] Out: 1050 [Urine:1050]  Intake/Output this shift: No intake/output data recorded.  Lab Results: Recent Labs    11/12/17 0423 11/13/17 0414  WBC 20.4* 19.5*  HGB 9.8* 10.2*  HCT 28.2* 29.7*  PLT 362 541*   BMET Recent Labs    11/11/17 0529 11/12/17 0423 11/13/17 0414  NA 127* 129* 135  K 3.4* 3.7 3.5  CL 92* 97* 100  CO2 19* 19* 24  GLUCOSE 217* 209* 219*  BUN 11 9 8   CREATININE 1.11 1.05 0.98  CALCIUM 7.9* 7.7* 8.3*   LFT Recent Labs    11/11/17 0529 11/12/17 0423 11/13/17 0414  PROT 6.0* 5.6* 6.3*  ALBUMIN 2.5* 2.3* 2.5*  AST 79* 73* 66*  ALT 78* 70* 73*  ALKPHOS 113 111 149*  BILITOT 3.4* 2.9* 1.9*   PT/INR Recent Labs    11/13/17 0414  LABPROT 13.8  INR 1.07   Hepatitis Panel No results for input(s): HEPBSAG, HCVAB, HEPAIGM, HEPBIGM in the last 72 hours.  Studies/Results: Dg Chest Port 1 View  Result Date: 11/12/2017 CLINICAL DATA:  Shortness  of breath with elevated white blood cell count EXAM: PORTABLE CHEST 1 VIEW COMPARISON:  November 07, 2017 FINDINGS: There is patchy consolidation in the left base with small left pleural effusion. There is mild right base atelectasis. Lungs elsewhere are clear. Heart is borderline enlarged with pulmonary vascularity normal. No adenopathy. No bone lesions. IMPRESSION: Patchy consolidation left base felt to represent focal pneumonia. Small left pleural effusion. Right base atelectasis. Mild cardiac prominence. Electronically Signed   By: Lowella Grip III M.D.   On: 11/12/2017 12:02   Scheduled Meds: . atenolol  50 mg Oral Daily  . enoxaparin (LOVENOX) injection  40 mg Subcutaneous Q24H  . feeding supplement (ENSURE ENLIVE)  237 mL Oral BID BM  . insulin aspart  0-15 Units Subcutaneous TID WC  . insulin aspart  0-5 Units Subcutaneous QHS   Continuous Infusions: . meropenem (MERREM) IV 1 g (11/13/17 0609)  . vancomycin 1,000 mg (11/13/17 0645)   PRN Meds:.acetaminophen **OR** acetaminophen, ALPRAZolam, diphenhydrAMINE-zinc acetate, gi cocktail, hydrALAZINE, HYDROcodone-acetaminophen, HYDROmorphone (DILAUDID) injection, ibuprofen, labetalol, ondansetron **OR** ondansetron (ZOFRAN) IV, traZODone   ASSESMENT:   *  Acute, severe pancreatitis, biliary (sludge on [place]) vs fenofibrate (Rx started 1 week before onset sxs).  CT for fevers, rising WBCs shows developing necrosis.  Hepatitis serologies processing just to assure no viral hepatitis. Day 3 Meropenem.  Eating at least 50% of his meals.    *  ? Left sided PNA.  Day 2 Vancomycin.  Blood clx negative to date.  > 24 hours without fever.  WBCs marginally improved in last 24 hours.    *  New dx DM, not clear if this purely result of pancreatitis or if trending in this direction PTA.    *   Normocytic anemia.  Hgb stable within 0.5 gm over last few days.          PLAN   *  Supportive care.  ? Duration of Abx?  *  Cal count in progress per  RD.      Paul [name]  11/13/2017, 12:46 PM Phone [phone]

## 2017-11-13 NOTE — Progress Notes (Signed)
PROGRESS NOTE    Paul [name]  KZL:[id number] DOB: [birth year] DOA: 11/03/2017 PCP: Karleen Hampshire., MD    Brief Narrative:  37 year old male with history of hypertension hypertriglyceridemia, history of renal cell carcinoma status post left nephrectomy in 2012 in [place] admitted overnight with severe acute pancreatitis, and mild AKI. -GI following -hospitalization complicated by hypoxia/volume overload-improved -Now with fevers, leukocytosis -follow up CT abd with findings worrisome for pancreatic necrosis  Assessment & Plan:   Principal Problem:   Acute pancreatitis Active Problems:   ARF (acute renal failure) (HCC)   Essential hypertension   Abnormal LFTs   1. Acute pancreatitis -Etiology uncertain -no history of heavy EtOH use (although may not be forthcoming), triglycerides within normal range, no gallstones noted on CT, IgG4 within normal limits -clinical improvement noted as pt tolerating diet without difficulty -IV fluids discontinued 8/9 due to volume overload -now with fevers and bloating -Follow CT abd on 8/13 worrisome for development of pancreatic necrosis -continued on meropenem -RUQ [place] noted, no dilated CBD, no stones, GB sludge noted -GI following. Patient remains on empiric meropenem as already ordered. Continues to tolerate PO without difficulty -Labs reviewed. LFT's improving slowly  2. Fluid overload/Pleural effusions -Due to aggressive fluid resuscitation and third spacing from severe pancreatitis/hypoalbuminemia -diuresed aggressively with low-dose Lasix 8/9, -improved after additional diuresis -Remains on room air, ambulating without difficulty  3. Acute kidney injury -Baseline creatinine is 1.1, this admission has been in 1.5 range, likely ATN from above -renal function has normalized -Will recheck bmet in AM  4. History of renal cell carcinoma,  -Patient is status post left-sided nephrectomy in 2012 - Patient to follow up with  Urology -currently stable. Continues to void well  5. Hypertension/rebound tachycardia -continue atenolol as tolerated -HR has improved, currently in the 90's  6. Hyperglycemia -from pancreatic dysfunction from inflammation, hemoglobin A1c was 5.5 -continue SSI coverage, continue with moderate scale per diabetic coordinator recommendations -Stable at present  7. CAP -Patient with peak WBC of 20k with continued fevers -Blood cultures neg x 2 -UA reviewed with moderate blood, otherwise not suggestive of UTI -Had ordered and reviewed CXR with findings of L sided consolidation and small L pleural effusion suggesting PNA -Continue on vancomycin with meropenem -WBC has improved somewhat to 19.5k this AM with fevers improving  8. Sinus Tach -Asymptomatic and no chest pain -Have ordered and reviewed EKG, sinus tach noted -continued on atenolol -Have ordered PRN labetalol for tachycardia  DVT prophylaxis: Lovenox subQ Code Status: Full Family Communication: Pt in room, family at bedside Disposition Plan: Uncertain at this time  Consultants:   GI  Procedures:     Antimicrobials: Anti-infectives (From admission, onward)   Start     Dose/Rate Route Frequency Ordered Stop   11/12/17 2230  vancomycin (VANCOCIN) IVPB 1000 mg/200 mL premix     1,000 mg 200 mL/hr over 60 Minutes Intravenous Every 8 hours 11/12/17 1407     11/12/17 1415  vancomycin (VANCOCIN) 1,500 mg in sodium chloride 0.9 % 500 mL IVPB     1,500 mg 250 mL/hr over 120 Minutes Intravenous  Once 11/12/17 1407 11/12/17 1649   11/10/17 1600  meropenem (MERREM) 1 g in sodium chloride 0.9 % 100 mL IVPB     1 g 200 mL/hr over 30 Minutes Intravenous Every 8 hours 11/10/17 1552        Subjective: Tolerating diet. States feeling better today  Objective: Vitals:   11/12/17 2334 11/13/17 0437 11/13/17 0852 11/13/17 1136  BP: (!) 146/91 136/88 (!) 118/91 (!) 134/100  Pulse: 94 97 (!) 130 93  Resp: 18 18 20 16    Temp: 99.4 F (37.4 C) 99 F (37.2 C) 98.2 F (36.8 C) 98 F (36.7 C)  TempSrc: Oral Oral Oral Oral  SpO2: 96% 98% 96% 97%  Weight:      Height:        Intake/Output Summary (Last 24 hours) at 11/13/2017 1551 Last data filed at 11/13/2017 1443 Gross per 24 hour  Intake 1100.31 ml  Output 700 ml  Net 400.31 ml   Filed Weights   11/09/17 0452 11/10/17 0350 11/11/17 0349  Weight: 89.3 kg 89.2 kg 88.4 kg    Examination: General exam: Conversant, in no acute distress Respiratory system: normal chest rise, clear, no audible wheezing Cardiovascular system: regular rhythm, s1-s2 Gastrointestinal system: Nondistended, nontender, pos BS Central nervous system: No seizures, no tremors Extremities: No cyanosis, no joint deformities Skin: No rashes, no pallor Psychiatry: Affect normal // no auditory hallucinations    Data Reviewed: I have personally reviewed following labs and imaging studies  CBC: Recent Labs  Lab 11/08/17 0456 11/09/17 0344 11/10/17 0419 11/12/17 0423 11/13/17 0414  WBC 12.8* 13.2* 15.3* 20.4* 19.5*  NEUTROABS  --   --   --   --  16.3*  HGB 10.7* 10.1* 10.3* 9.8* 10.2*  HCT 32.0* 30.5* 29.4* 28.2* 29.7*  MCV 86.3 85.9 83.1 83.9 84.6  PLT 157 174 211 362 681*   Basic Metabolic Panel: Recent Labs  Lab 11/09/17 0344 11/10/17 0419 11/11/17 0529 11/12/17 0423 11/13/17 0414  NA 127* 129* 127* 129* 135  K 3.9 3.5 3.4* 3.7 3.5  CL 91* 93* 92* 97* 100  CO2 20* 19* 19* 19* 24  GLUCOSE 246* 221* 217* 209* 219*  BUN 16 12 11 9 8   CREATININE 1.39* 1.21 1.11 1.05 0.98  CALCIUM 7.8* 7.9* 7.9* 7.7* 8.3*   GFR: Estimated Creatinine Clearance: 116.7 mL/min (by C-G formula based on SCr of 0.98 mg/dL). Liver Function Tests: Recent Labs  Lab 11/09/17 0344 11/10/17 0419 11/11/17 0529 11/12/17 0423 11/13/17 0414  AST 64* 92* 79* 73* 66*  ALT 39 67* 78* 70* 73*  ALKPHOS 82 94 113 111 149*  BILITOT 3.8* 4.1* 3.4* 2.9* 1.9*  PROT 5.8* 5.5* 6.0* 5.6* 6.3*   ALBUMIN 2.5* 2.3* 2.5* 2.3* 2.5*   Recent Labs  Lab 11/07/17 0515 11/09/17 0344 11/11/17 0529  LIPASE 571* 70* 67*   No results for input(s): AMMONIA in the last 168 hours. Coagulation Profile: Recent Labs  Lab 11/13/17 0414  INR 1.07   Cardiac Enzymes: No results for input(s): CKTOTAL, CKMB, CKMBINDEX, TROPONINI in the last 168 hours. BNP (last 3 results) No results for input(s): PROBNP in the last 8760 hours. HbA1C: No results for input(s): HGBA1C in the last 72 hours. CBG: Recent Labs  Lab 11/12/17 1134 11/12/17 1616 11/13/17 0014 11/13/17 0639 11/13/17 1134  GLUCAP 186* 168* 279* 219* 202*   Lipid Profile: No results for input(s): CHOL, HDL, LDLCALC, TRIG, CHOLHDL, LDLDIRECT in the last 72 hours. Thyroid Function Tests: Recent Labs    11/13/17 0414  TSH 2.417   Anemia Panel: No results for input(s): VITAMINB12, FOLATE, FERRITIN, TIBC, IRON, RETICCTPCT in the last 72 hours. Sepsis Labs: Recent Labs  Lab 11/13/17 0414  LATICACIDVEN 1.1    Recent Results (from the past 240 hour(s))  Culture, blood (routine x 2)     Status: None (Preliminary result)   Collection Time: 11/12/17  10:30 AM  Result Value Ref Range Status   Specimen Description BLOOD RIGHT ANTECUBITAL  Final   Special Requests   Final    BOTTLES DRAWN AEROBIC AND ANAEROBIC Blood Culture adequate volume   Culture   Final    NO GROWTH 1 DAY Performed at [facility], [place] [address], [place], [place] [address]    Report Status PENDING  Incomplete  Culture, blood (routine x 2)     Status: None (Preliminary result)   Collection Time: 11/12/17 10:35 AM  Result Value Ref Range Status   Specimen Description BLOOD LEFT ANTECUBITAL  Final   Special Requests   Final    BOTTLES DRAWN AEROBIC AND ANAEROBIC Blood Culture adequate volume   Culture   Final    NO GROWTH 1 DAY Performed at [facility], [place] [address] [place], [place] [address]    Report Status PENDING  Incomplete      Radiology Studies: Dg Chest Port 1 View  Result Date: 11/12/2017 CLINICAL DATA:  Shortness of breath with elevated white blood cell count EXAM: PORTABLE CHEST 1 VIEW COMPARISON:  November 07, 2017 FINDINGS: There is patchy consolidation in the left base with small left pleural effusion. There is mild right base atelectasis. Lungs elsewhere are clear. Heart is borderline enlarged with pulmonary vascularity normal. No adenopathy. No bone lesions. IMPRESSION: Patchy consolidation left base felt to represent focal pneumonia. Small left pleural effusion. Right base atelectasis. Mild cardiac prominence. Electronically Signed   By: Lowella Grip III M.D.   On: 11/12/2017 12:02    Scheduled Meds: . atenolol  50 mg Oral Daily  . enoxaparin (LOVENOX) injection  40 mg Subcutaneous Q24H  . feeding supplement (ENSURE ENLIVE)  237 mL Oral BID BM  . feeding supplement (PRO-STAT SUGAR FREE 64)  30 mL Oral BID  . insulin aspart  0-15 Units Subcutaneous TID WC  . insulin aspart  0-5 Units Subcutaneous QHS  . multivitamin with minerals  1 tablet Oral Daily   Continuous Infusions: . meropenem (MERREM) IV Stopped (11/13/17 1442)  . vancomycin 1,000 mg (11/13/17 1443)     LOS: 9 days   Marylu Lund, MD [facility] Pager [phone]  If 7PM-7AM, please contact night-coverage [email or web address] Password [facility] 11/13/2017, 3:51 PM

## 2017-11-14 LAB — HEPATITIS A ANTIBODY, TOTAL: HEP A TOTAL AB: NEGATIVE

## 2017-11-14 LAB — CBC
HCT: 28 % — ABNORMAL LOW (ref 39.0–52.0)
HEMOGLOBIN: 9.2 g/dL — AB (ref 13.0–17.0)
MCH: 28.4 pg (ref 26.0–34.0)
MCHC: 32.9 g/dL (ref 30.0–36.0)
MCV: 86.4 fL (ref 78.0–100.0)
PLATELETS: 579 10*3/uL — AB (ref 150–400)
RBC: 3.24 MIL/uL — ABNORMAL LOW (ref 4.22–5.81)
RDW: 14.7 % (ref 11.5–15.5)
WBC: 16.1 10*3/uL — ABNORMAL HIGH (ref 4.0–10.5)

## 2017-11-14 LAB — COMPREHENSIVE METABOLIC PANEL
ALBUMIN: 2.3 g/dL — AB (ref 3.5–5.0)
ALK PHOS: 153 U/L — AB (ref 38–126)
ALT: 71 U/L — ABNORMAL HIGH (ref 0–44)
AST: 70 U/L — AB (ref 15–41)
Anion gap: 12 (ref 5–15)
BUN: 10 mg/dL (ref 6–20)
CALCIUM: 8 mg/dL — AB (ref 8.9–10.3)
CHLORIDE: 98 mmol/L (ref 98–111)
CO2: 21 mmol/L — AB (ref 22–32)
CREATININE: 1.01 mg/dL (ref 0.61–1.24)
GFR calc Af Amer: >60 mL/min (ref >60)
GFR calc non Af Amer: >60 mL/min (ref >60)
GLUCOSE: 212 mg/dL — AB (ref 70–99)
Potassium: 3.8 mmol/L (ref 3.5–5.1)
SODIUM: 131 mmol/L — AB (ref 135–145)
Total Bilirubin: 1.8 mg/dL — ABNORMAL HIGH (ref 0.3–1.2)
Total Protein: 5.9 g/dL — ABNORMAL LOW (ref 6.5–8.1)

## 2017-11-14 LAB — HCV COMMENT:

## 2017-11-14 LAB — GLUCOSE, CAPILLARY
GLUCOSE-CAPILLARY: 219 mg/dL — AB (ref 70–99)
GLUCOSE-CAPILLARY: 247 mg/dL — AB (ref 70–99)
Glucose-Capillary: 263 mg/dL — ABNORMAL HIGH (ref 70–99)
Glucose-Capillary: 194 mg/dL — ABNORMAL HIGH (ref 70–99)

## 2017-11-14 LAB — MAGNESIUM: Magnesium: 2.1 mg/dL (ref 1.7–2.4)

## 2017-11-14 LAB — HEPATITIS B SURFACE ANTIGEN: HEP B S AG: NEGATIVE

## 2017-11-14 LAB — HEPATITIS B CORE ANTIBODY, IGM: Hep B C IgM: NEGATIVE

## 2017-11-14 LAB — PHOSPHORUS: Phosphorus: 2.9 mg/dL (ref 2.5–4.6)

## 2017-11-14 LAB — HEPATITIS C ANTIBODY (REFLEX): HCV Ab: <0.1 s/co ratio (ref 0.0–0.9)

## 2017-11-14 LAB — HEPATITIS B SURFACE ANTIBODY, QUANTITATIVE: Hepatitis B-Post: 121.8 m[IU]/mL (ref >9.9)

## 2017-11-14 MED ORDER — HYDROXYZINE HCL 25 MG PO TABS
25.0000 mg | ORAL_TABLET | Freq: Every evening | ORAL | Status: DC | PRN
  Administered 2017-11-14 – 2017-11-17 (×4): 25 mg via ORAL
  Filled 2017-11-14 (×4): qty 1

## 2017-11-14 NOTE — Progress Notes (Addendum)
Patient ID: Paul [name], male   DOB: [birth year], 37 y.o.   MRN: [id number]    Progress Note   Subjective  Day # 11 hospital stay  On Meropenem   Feeling pretty well - denies pain or nausea- has been eating  Up walking  Without difficulty, having Bm's  No fever  X 36 hours   Objective   Vital signs in last 24 hours: Temp:  [98.6 F (37 C)-99.9 F (37.7 C)] 98.8 F (37.1 C) (08/17 1218) Pulse Rate:  [93-105] 93 (08/17 1218) Resp:  [16-20] 16 (08/17 0810) BP: (133-144)/(82-95) 139/94 (08/17 1218) SpO2:  [95 %-99 %] 99 % (08/17 1218) Last BM Date: 11/14/17 General:    Young male  in NAD, pleasant, comfortable Heart:  Regular rate and rhythm; no murmurs Lungs: Respirations even and unlabored, lungs CTA bilaterally Abdomen:  Soft,  Minimally tender and nondistended. Normal bowel sounds. Extremities:  Without edema. Neurologic:  Alert and oriented,  grossly normal neurologically. Psych:  Cooperative. Normal mood and affect.  Intake/Output from previous day: 08/16 0701 - 08/17 0700 In: 300 [IV Piggyback:300] Out: -  Intake/Output this shift: No intake/output data recorded.  Lab Results: Recent Labs    11/12/17 0423 11/13/17 0414 11/14/17 0556  WBC 20.4* 19.5* 16.1*  HGB 9.8* 10.2* 9.2*  HCT 28.2* 29.7* 28.0*  PLT 362 541* 579*   BMET Recent Labs    11/12/17 0423 11/13/17 0414 11/14/17 0556  NA 129* 135 131*  K 3.7 3.5 3.8  CL 97* 100 98  CO2 19* 24 21*  GLUCOSE 209* 219* 212*  BUN 9 8 10   CREATININE 1.05 0.98 1.01  CALCIUM 7.7* 8.3* 8.0*   LFT Recent Labs    11/14/17 0556  PROT 5.9*  ALBUMIN 2.3*  AST 70*  ALT 71*  ALKPHOS 153*  BILITOT 1.8*   PT/INR Recent Labs    11/13/17 0414  LABPROT 13.8  INR 1.07       Assessment / Plan:    #1 37 yo male with acute severe pancreatitis with CT scan concerning for developing necrosis. Etiology of pancreatitis unclear biliary sludge versus adverse medication effect/fenofibrate  WBC coming down today,  no fever Patient clinically looks well, is tolerating p.o.'s and has minimal if any complaint of abdominal pain  #2 possible pneumonia-now on vancomycin as well as meropenem  #3 status post nephrectomy  #4 Hyperlipidemia  Recommendations: Continue current regimen Calorie counts are in progress - low-fat diet Do not restart fenofibrate Will need follow-up imaging - would anticipate Mon-Tues     Principal Problem:   Acute pancreatitis Active Problems:   ARF (acute renal failure) (HCC)   Essential hypertension   Abnormal LFTs    LOS: 10 days   Paul [name]  11/14/2017, 1:49 PM      Attending physician's note   I have taken an interval history, reviewed the chart and examined the patient. I agree with the Advanced Practitioner's note, impression and recommendations. Severe pancreatitis, slowly improving. Biliary sludge vs. fenofibrate. Meropenem and vancomycin. Trend CMET, CBC. Repeat abdominal imaging likely early next week.   Paul Edward, MD FACG [phone] office

## 2017-11-14 NOTE — Progress Notes (Signed)
PROGRESS NOTE    Paul [name]  EXH:[id number] DOB: May 16, 1980 DOA: 11/03/2017 PCP: Karleen Hampshire., MD    Brief Narrative:  37 year old male with history of hypertension hypertriglyceridemia, history of renal cell carcinoma status post left nephrectomy in 2012 in [place] admitted overnight with severe acute pancreatitis, and mild AKI. -GI following -hospitalization complicated by hypoxia/volume overload-improved -Now with fevers, leukocytosis -follow up CT abd with findings worrisome for pancreatic necrosis  Assessment & Plan:   Principal Problem:   Acute pancreatitis Active Problems:   ARF (acute renal failure) (HCC)   Essential hypertension   Abnormal LFTs   1. Acute pancreatitis -Etiology uncertain -no history of heavy EtOH use (although may not be forthcoming), triglycerides within normal range, no gallstones noted on CT, IgG4 within normal limits -clinical improvement noted as pt tolerating diet without difficulty -IV fluids discontinued 8/9 due to volume overload -now with fevers and bloating -Follow CT abd on 8/13 worrisome for development of pancreatic necrosis -continued on meropenem -RUQ [place] noted, no dilated CBD, no stones, GB sludge noted -GI following. Patient remains on empiric meropenem as already ordered. Continues to tolerate PO without difficulty -Labs reviewed. LFT's improving slowly  2. Fluid overload/Pleural effusions -Due to aggressive fluid resuscitation and third spacing from severe pancreatitis/hypoalbuminemia -diuresed aggressively with low-dose Lasix 8/9, -improved after additional diuresis -Presently on room air, ambulating  3. Acute kidney injury -Baseline creatinine is 1.1, this admission has been in 1.5 range, likely ATN from above -renal function has normalized -Cr remains stable. Repeat bmet in AM  4. History of renal cell carcinoma,  -Patient is status post left-sided nephrectomy in 2012 - Patient to follow up with Urology - Presently  stable. Continuing to void  5. Hypertension/rebound tachycardia -continue atenolol as tolerated -HR has improved, presently rate controlled  6. Hyperglycemia -from pancreatic dysfunction from inflammation, hemoglobin A1c was 5.5 -continue SSI coverage, continue with moderate scale per diabetic coordinator recommendations -currently stable.  7. CAP -Patient with peak WBC of 20k with continued fevers -Blood cultures neg x 2 -UA reviewed with moderate blood, otherwise not suggestive of UTI -Had ordered and reviewed CXR with findings of L sided consolidation and small L pleural effusion suggesting PNA -Continue on vancomycin with meropenem -WBC improved to 16k this AM with clinical improvement  8. Sinus Tach -Asymptomatic and no chest pain -continued on atenolol -Continue PRN labetalol for tachycardia -Stable at present  DVT prophylaxis: Lovenox subQ Code Status: Full Family Communication: Pt in room, family at bedside Disposition Plan: Uncertain at this time  Consultants:   GI  Procedures:     Antimicrobials: Anti-infectives (From admission, onward)   Start     Dose/Rate Route Frequency Ordered Stop   11/12/17 2230  vancomycin (VANCOCIN) IVPB 1000 mg/200 mL premix     1,000 mg 200 mL/hr over 60 Minutes Intravenous Every 8 hours 11/12/17 1407     11/12/17 1415  vancomycin (VANCOCIN) 1,500 mg in sodium chloride 0.9 % 500 mL IVPB     1,500 mg 250 mL/hr over 120 Minutes Intravenous  Once 11/12/17 1407 11/12/17 1649   11/10/17 1600  meropenem (MERREM) 1 g in sodium chloride 0.9 % 100 mL IVPB     1 g 200 mL/hr over 30 Minutes Intravenous Every 8 hours 11/10/17 1552        Subjective: Tolerating diet. States feeling better today  Objective: Vitals:   11/14/17 0425 11/14/17 0810 11/14/17 1218 11/14/17 1656  BP: 133/85 (!) 144/86 (!) 139/94 (!) 145/91  Pulse: Marland Kitchen)  104 95 93 91  Resp: 18 16  16   Temp: 98.6 F (37 C) 99.4 F (37.4 C) 98.8 F (37.1 C) 98.8 F (37.1  C)  TempSrc: Oral Oral Oral Oral  SpO2: 95% 97% 99% 97%  Weight:      Height:       No intake or output data in the 24 hours ending 11/14/17 1838 Filed Weights   11/09/17 0452 11/10/17 0350 11/11/17 0349  Weight: 89.3 kg 89.2 kg 88.4 kg    Examination: General exam: Awake, laying in bed, in nad Respiratory system: Normal respiratory effort, no wheezing Cardiovascular system: regular rate, s1, s2 Gastrointestinal system: Soft, nondistended, positive BS Central nervous system: CN2-12 grossly intact, strength intact Extremities: Perfused, no clubbing Skin: Normal skin turgor, no notable skin lesions seen Psychiatry: Mood normal // no visual hallucinations    Data Reviewed: I have personally reviewed following labs and imaging studies  CBC: Recent Labs  Lab 11/09/17 0344 11/10/17 0419 11/12/17 0423 11/13/17 0414 11/14/17 0556  WBC 13.2* 15.3* 20.4* 19.5* 16.1*  NEUTROABS  --   --   --  16.3*  --   HGB 10.1* 10.3* 9.8* 10.2* 9.2*  HCT 30.5* 29.4* 28.2* 29.7* 28.0*  MCV 85.9 83.1 83.9 84.6 86.4  PLT 174 211 362 541* 161*   Basic Metabolic Panel: Recent Labs  Lab 11/10/17 0419 11/11/17 0529 11/12/17 0423 11/13/17 0414 11/14/17 0556  NA 129* 127* 129* 135 131*  K 3.5 3.4* 3.7 3.5 3.8  CL 93* 92* 97* 100 98  CO2 19* 19* 19* 24 21*  GLUCOSE 221* 217* 209* 219* 212*  BUN 12 11 9 8 10   CREATININE 1.21 1.11 1.05 0.98 1.01  CALCIUM 7.9* 7.9* 7.7* 8.3* 8.0*  MG  --   --   --   --  2.1  PHOS  --   --   --   --  2.9   GFR: Estimated Creatinine Clearance: 113.3 mL/min (by C-G formula based on SCr of 1.01 mg/dL). Liver Function Tests: Recent Labs  Lab 11/10/17 0419 11/11/17 0529 11/12/17 0423 11/13/17 0414 11/14/17 0556  AST 92* 79* 73* 66* 70*  ALT 67* 78* 70* 73* 71*  ALKPHOS 94 113 111 149* 153*  BILITOT 4.1* 3.4* 2.9* 1.9* 1.8*  PROT 5.5* 6.0* 5.6* 6.3* 5.9*  ALBUMIN 2.3* 2.5* 2.3* 2.5* 2.3*   Recent Labs  Lab 11/09/17 0344 11/11/17 0529  LIPASE 70*  67*   No results for input(s): AMMONIA in the last 168 hours. Coagulation Profile: Recent Labs  Lab 11/13/17 0414  INR 1.07   Cardiac Enzymes: No results for input(s): CKTOTAL, CKMB, CKMBINDEX, TROPONINI in the last 168 hours. BNP (last 3 results) No results for input(s): PROBNP in the last 8760 hours. HbA1C: No results for input(s): HGBA1C in the last 72 hours. CBG: Recent Labs  Lab 11/13/17 1615 11/13/17 2149 11/14/17 0640 11/14/17 1120 11/14/17 1655  GLUCAP 218* 160* 219* 247* 263*   Lipid Profile: No results for input(s): CHOL, HDL, LDLCALC, TRIG, CHOLHDL, LDLDIRECT in the last 72 hours. Thyroid Function Tests: Recent Labs    11/13/17 0414  TSH 2.417   Anemia Panel: No results for input(s): VITAMINB12, FOLATE, FERRITIN, TIBC, IRON, RETICCTPCT in the last 72 hours. Sepsis Labs: Recent Labs  Lab 11/13/17 0414  LATICACIDVEN 1.1    Recent Results (from the past 240 hour(s))  Culture, blood (routine x 2)     Status: None (Preliminary result)   Collection Time: 11/12/17 10:30 AM  Result Value Ref Range Status   Specimen Description BLOOD RIGHT ANTECUBITAL  Final   Special Requests   Final    BOTTLES DRAWN AEROBIC AND ANAEROBIC Blood Culture adequate volume   Culture   Final    NO GROWTH 2 DAYS Performed at [facility], [address], [place], [place] [address]    Report Status PENDING  Incomplete  Culture, blood (routine x 2)     Status: None (Preliminary result)   Collection Time: 11/12/17 10:35 AM  Result Value Ref Range Status   Specimen Description BLOOD LEFT ANTECUBITAL  Final   Special Requests   Final    BOTTLES DRAWN AEROBIC AND ANAEROBIC Blood Culture adequate volume   Culture   Final    NO GROWTH 2 DAYS Performed at [facility], [place] [address] [place], [place] [address]    Report Status PENDING  Incomplete     Radiology Studies: No results found.  Scheduled Meds: . atenolol  50 mg Oral Daily  . enoxaparin (LOVENOX)  injection  40 mg Subcutaneous Q24H  . feeding supplement (ENSURE ENLIVE)  237 mL Oral BID BM  . feeding supplement (PRO-STAT SUGAR FREE 64)  30 mL Oral BID  . insulin aspart  0-15 Units Subcutaneous TID WC  . insulin aspart  0-5 Units Subcutaneous QHS  . multivitamin with minerals  1 tablet Oral Daily   Continuous Infusions: . meropenem (MERREM) IV 1 g (11/14/17 1439)  . vancomycin 1,000 mg (11/14/17 1523)     LOS: 10 days   Marylu Lund, MD [facility] Pager [phone]  If 7PM-7AM, please contact night-coverage [email or web address] Password [email or web address] 11/14/2017, 6:38 PM

## 2017-11-15 DIAGNOSIS — N171 Acute kidney failure with acute cortical necrosis: Secondary | ICD-10-CM

## 2017-11-15 LAB — COMPREHENSIVE METABOLIC PANEL
ALK PHOS: 145 U/L — AB (ref 38–126)
ALT: 63 U/L — ABNORMAL HIGH (ref 0–44)
AST: 50 U/L — AB (ref 15–41)
Albumin: 2.4 g/dL — ABNORMAL LOW (ref 3.5–5.0)
Anion gap: 10 (ref 5–15)
BILIRUBIN TOTAL: 1.5 mg/dL — AB (ref 0.3–1.2)
BUN: 11 mg/dL (ref 6–20)
CO2: 23 mmol/L (ref 22–32)
Calcium: 8.2 mg/dL — ABNORMAL LOW (ref 8.9–10.3)
Chloride: 100 mmol/L (ref 98–111)
Creatinine, Ser: 1 mg/dL (ref 0.61–1.24)
GFR calc Af Amer: >60 mL/min (ref >60)
GFR calc non Af Amer: >60 mL/min (ref >60)
GLUCOSE: 236 mg/dL — AB (ref 70–99)
POTASSIUM: 4.3 mmol/L (ref 3.5–5.1)
SODIUM: 133 mmol/L — AB (ref 135–145)
TOTAL PROTEIN: 6.6 g/dL (ref 6.5–8.1)

## 2017-11-15 LAB — CBC
HEMATOCRIT: 29.7 % — AB (ref 39.0–52.0)
HEMOGLOBIN: 9.5 g/dL — AB (ref 13.0–17.0)
MCH: 27.9 pg (ref 26.0–34.0)
MCHC: 32 g/dL (ref 30.0–36.0)
MCV: 87.4 fL (ref 78.0–100.0)
Platelets: 462 10*3/uL — ABNORMAL HIGH (ref 150–400)
RBC: 3.4 MIL/uL — ABNORMAL LOW (ref 4.22–5.81)
RDW: 15.1 % (ref 11.5–15.5)
WBC: 14 10*3/uL — ABNORMAL HIGH (ref 4.0–10.5)

## 2017-11-15 LAB — GLUCOSE, CAPILLARY
GLUCOSE-CAPILLARY: 240 mg/dL — AB (ref 70–99)
GLUCOSE-CAPILLARY: 268 mg/dL — AB (ref 70–99)
Glucose-Capillary: 260 mg/dL — ABNORMAL HIGH (ref 70–99)
Glucose-Capillary: 160 mg/dL — ABNORMAL HIGH (ref 70–99)

## 2017-11-15 LAB — VANCOMYCIN, TROUGH: Vancomycin Tr: 19 ug/mL (ref 15–20)

## 2017-11-15 LAB — MRSA PCR SCREENING: MRSA by PCR: POSITIVE — AB

## 2017-11-15 MED ORDER — INSULIN ASPART 100 UNIT/ML ~~LOC~~ SOLN
2.0000 [IU] | Freq: Three times a day (TID) | SUBCUTANEOUS | Status: DC
  Administered 2017-11-16 (×2): 2 [IU] via SUBCUTANEOUS

## 2017-11-15 MED ORDER — AMLODIPINE BESYLATE 2.5 MG PO TABS
2.5000 mg | ORAL_TABLET | Freq: Every day | ORAL | Status: DC
  Administered 2017-11-15 – 2017-11-17 (×3): 2.5 mg via ORAL
  Filled 2017-11-15 (×3): qty 1

## 2017-11-15 MED ORDER — INSULIN GLARGINE 100 UNIT/ML ~~LOC~~ SOLN
10.0000 [IU] | Freq: Every day | SUBCUTANEOUS | Status: DC
  Administered 2017-11-15 – 2017-11-16 (×2): 10 [IU] via SUBCUTANEOUS
  Filled 2017-11-15 (×2): qty 0.1

## 2017-11-15 NOTE — Progress Notes (Addendum)
Patient ID: Paul [name], male   DOB: 1980/08/13, 37 y.o.   MRN: [id number]    Progress Note   Subjective  Day # 12  On meropenem Vanco started for probable pneumonia Eating solid diet   Temp 100.2 now  He feels fine - denies any abdominal pain, no nausea, stool loose x 2 this am .    Objective   Vital signs in last 24 hours: Temp:  [98.4 F (36.9 C)-100.2 F (37.9 C)] 100.2 F (37.9 C) (08/18 1239) Pulse Rate:  [91-118] 96 (08/18 1239) Resp:  [15-18] 15 (08/18 1239) BP: (138-157)/(91-105) 142/101 (08/18 1239) SpO2:  [94 %-98 %] 98 % (08/18 1239) Last BM Date: 11/14/17 General:    Young male  in NAD- wife at bedside  Heart:  Regular rate and rhythm; no murmurs Lungs: Respirations even and unlabored, lungs CTA bilaterally Abdomen:  Soft, nontender and nondistended. Normal bowel sounds. Extremities:  Without edema. Neurologic:  Alert and oriented,  grossly normal neurologically. Psych:  Cooperative. Normal mood and affect.  Intake/Output from previous day: 08/17 0701 - 08/18 0700 In: 3626 [P.O.:600; IV Piggyback:3026] Out: -  Intake/Output this shift: Total I/O In: 236 [P.O.:236] Out: -   Lab Results: Recent Labs    11/13/17 0414 11/14/17 0556 11/15/17 0536  WBC 19.5* 16.1* 14.0*  HGB 10.2* 9.2* 9.5*  HCT 29.7* 28.0* 29.7*  PLT 541* 579* 462*   BMET Recent Labs    11/13/17 0414 11/14/17 0556 11/15/17 0536  NA 135 131* 133*  K 3.5 3.8 4.3  CL 100 98 100  CO2 24 21* 23  GLUCOSE 219* 212* 236*  BUN 8 10 11   CREATININE 0.98 1.01 1.00  CALCIUM 8.3* 8.0* 8.2*   LFT Recent Labs    11/15/17 0536  PROT 6.6  ALBUMIN 2.4*  AST 50*  ALT 63*  ALKPHOS 145*  BILITOT 1.5*   PT/INR Recent Labs    11/13/17 0414  LABPROT 13.8  INR 1.07      Assessment / Plan:     #1 37 yo male with acute severe pancreatitis with CT concerning for developing necrosis 8/14  Etiology prob secondary to Fenofibrate which he had just started  GB sludge - no stones    WBC continues to trend down, on Meropenem  Pt looks well, eating without difficulty, and no pain  #2 HCAP - left base - on Vanc #3 hyperlipidemia #4 s/p nephrectomy   Recommendations:  No changes today  Continue current regimen  Stay off fenofibrate  Will plan follow up CT mid week   Principal Problem:   Acute pancreatitis Active Problems:   ARF (acute renal failure) (HCC)   Essential hypertension   Abnormal LFTs     LOS: 11 days   Amy Esterwood  11/15/2017, 12:49 PM      Attending physician's note   I have taken an interval history, reviewed the chart and examined the patient. I agree with the Advanced Practitioner's note, impression and recommendations. Acute, severe pancreatitis, improving. T=100.2 now. Follow CT soon. No changes in mgmt today.  Lucio Edward, MD FACG [phone] office

## 2017-11-15 NOTE — Progress Notes (Addendum)
PROGRESS NOTE    Paul [name]  VOJ:[id number] DOB: [birth year] DOA: 11/03/2017 PCP: Karleen Hampshire., MD    Brief Narrative:  37 year old male with history of hypertension hypertriglyceridemia, history of renal cell carcinoma status post left nephrectomy in 2012 in [place] admitted overnight with severe acute pancreatitis, and mild AKI. -GI following -hospitalization complicated by hypoxia/volume overload-improved -Now with fevers, leukocytosis -follow up CT abd with findings worrisome for pancreatic necrosis  Assessment & Plan:   Principal Problem:   Acute pancreatitis Active Problems:   ARF (acute renal failure) (HCC)   Essential hypertension   Abnormal LFTs   1. Acute pancreatitis -Etiology uncertain -no history of heavy EtOH use (although may not be forthcoming), triglycerides within normal range, no gallstones noted on CT, IgG4 within normal limits -clinical improvement noted as pt tolerating diet without difficulty -IV fluids discontinued 8/9 due to volume overload -now with fevers and bloating -Follow CT abd on 8/13 worrisome for development of pancreatic necrosis -continued on meropenem -RUQ [place] noted, no dilated CBD, no stones, GB sludge noted -GI following. Patient remains on empiric meropenem as already ordered. Continues to tolerate PO without difficulty -Labs reviewed. LFT's showing steady  Improvement. Possible follow up CT per GI recommendations -Patient seen today alongside GI  2. Fluid overload/Pleural effusions -Due to aggressive fluid resuscitation and third spacing from severe pancreatitis/hypoalbuminemia -diuresed aggressively with low-dose Lasix 8/9, -improved after additional diuresis -Presently on room air, ambulating without difficulty  3. Acute kidney injury -Baseline creatinine is 1.1, this admission has been in 1.5 range, likely ATN from above -renal function has normalized and remains stable -Will recheck renal panel in AM  4. History of renal  cell carcinoma,  -Patient is status post left-sided nephrectomy in 2012 - Patient to follow up with Urology - Voiding well  5. Hypertension/rebound tachycardia -continue atenolol as tolerated -HR has improved, occasional tachycardia that resolves -BP is trending up with DBP into the 100's -Will resume home norvasc 2.5mg  tonight  6. Hyperglycemia -from pancreatic dysfunction from inflammation, hemoglobin A1c was 5.5 -glucose persistently in the 200's -Have started 10 units lantus daily with 2 units meal coverage. Continue SSI coverage  7. CAP -Patient with peak WBC of 20k with fevers noted -Blood cultures neg x 2 -UA reviewed with moderate blood, otherwise not suggestive of UTI -Recent CXR reviewed with findings of L sided consolidation and small L pleural effusion suggesting PNA -Continue on vancomycin with meropenem -WBC with continued improvement to 14k today -MRSA swab pos, thus would cont vanc for now. Anticipate completing 5 days of vanc  8. Sinus Tach -Improved -continued on atenolol -Continue PRN labetalol for tachycardia -Remains stable  DVT prophylaxis: Lovenox subQ Code Status: Full Family Communication: Pt in room, family at bedside Disposition Plan: Uncertain at this time  Consultants:   GI  Procedures:     Antimicrobials: Anti-infectives (From admission, onward)   Start     Dose/Rate Route Frequency Ordered Stop   11/12/17 2230  vancomycin (VANCOCIN) IVPB 1000 mg/200 mL premix     1,000 mg 200 mL/hr over 60 Minutes Intravenous Every 8 hours 11/12/17 1407     11/12/17 1415  vancomycin (VANCOCIN) 1,500 mg in sodium chloride 0.9 % 500 mL IVPB     1,500 mg 250 mL/hr over 120 Minutes Intravenous  Once 11/12/17 1407 11/12/17 1649   11/10/17 1600  meropenem (MERREM) 1 g in sodium chloride 0.9 % 100 mL IVPB     1 g 200 mL/hr over 30 Minutes Intravenous  Every 8 hours 11/10/17 1552        Subjective: Reports good sleep overnight with  vistaril  Objective: Vitals:   11/14/17 2339 11/15/17 0349 11/15/17 0914 11/15/17 1239  BP: (!) 157/105 (!) 141/98 (!) 138/93 (!) 142/101  Pulse: 100 96 (!) 118 96  Resp: 18 18 16 15   Temp: 98.5 F (36.9 C) 98.4 F (36.9 C) 98.7 F (37.1 C) 100.2 F (37.9 C)  TempSrc: Oral Oral Oral Oral  SpO2: 97% 95% 96% 98%  Weight:      Height:        Intake/Output Summary (Last 24 hours) at 11/15/2017 1729 Last data filed at 11/15/2017 0915 Gross per 24 hour  Intake 3861.96 ml  Output -  Net 3861.96 ml   Filed Weights   11/09/17 0452 11/10/17 0350 11/11/17 0349  Weight: 89.3 kg 89.2 kg 88.4 kg    Examination: General exam: Conversant, in no acute distress Respiratory system: normal chest rise, clear, no audible wheezing Cardiovascular system: regular rhythm, s1-s2 Gastrointestinal system: Nondistended, nontender, pos BS Central nervous system: No seizures, no tremors Extremities: No cyanosis, no joint deformities Skin: No rashes, no pallor Psychiatry: Affect normal // no auditory hallucinations   Data Reviewed: I have personally reviewed following labs and imaging studies  CBC: Recent Labs  Lab 11/10/17 0419 11/12/17 0423 11/13/17 0414 11/14/17 0556 11/15/17 0536  WBC 15.3* 20.4* 19.5* 16.1* 14.0*  NEUTROABS  --   --  16.3*  --   --   HGB 10.3* 9.8* 10.2* 9.2* 9.5*  HCT 29.4* 28.2* 29.7* 28.0* 29.7*  MCV 83.1 83.9 84.6 86.4 87.4  PLT 211 362 541* 579* 102*   Basic Metabolic Panel: Recent Labs  Lab 11/11/17 0529 11/12/17 0423 11/13/17 0414 11/14/17 0556 11/15/17 0536  NA 127* 129* 135 131* 133*  K 3.4* 3.7 3.5 3.8 4.3  CL 92* 97* 100 98 100  CO2 19* 19* 24 21* 23  GLUCOSE 217* 209* 219* 212* 236*  BUN 11 9 8 10 11   CREATININE 1.11 1.05 0.98 1.01 1.00  CALCIUM 7.9* 7.7* 8.3* 8.0* 8.2*  MG  --   --   --  2.1  --   PHOS  --   --   --  2.9  --    GFR: Estimated Creatinine Clearance: 114.4 mL/min (by C-G formula based on SCr of 1 mg/dL). Liver Function  Tests: Recent Labs  Lab 11/11/17 0529 11/12/17 0423 11/13/17 0414 11/14/17 0556 11/15/17 0536  AST 79* 73* 66* 70* 50*  ALT 78* 70* 73* 71* 63*  ALKPHOS 113 111 149* 153* 145*  BILITOT 3.4* 2.9* 1.9* 1.8* 1.5*  PROT 6.0* 5.6* 6.3* 5.9* 6.6  ALBUMIN 2.5* 2.3* 2.5* 2.3* 2.4*   Recent Labs  Lab 11/09/17 0344 11/11/17 0529  LIPASE 70* 67*   No results for input(s): AMMONIA in the last 168 hours. Coagulation Profile: Recent Labs  Lab 11/13/17 0414  INR 1.07   Cardiac Enzymes: No results for input(s): CKTOTAL, CKMB, CKMBINDEX, TROPONINI in the last 168 hours. BNP (last 3 results) No results for input(s): PROBNP in the last 8760 hours. HbA1C: No results for input(s): HGBA1C in the last 72 hours. CBG: Recent Labs  Lab 11/14/17 1655 11/14/17 2203 11/15/17 0552 11/15/17 1126 11/15/17 1640  GLUCAP 263* 194* 240* 268* 260*   Lipid Profile: No results for input(s): CHOL, HDL, LDLCALC, TRIG, CHOLHDL, LDLDIRECT in the last 72 hours. Thyroid Function Tests: Recent Labs    11/13/17 0414  TSH 2.417  Anemia Panel: No results for input(s): VITAMINB12, FOLATE, FERRITIN, TIBC, IRON, RETICCTPCT in the last 72 hours. Sepsis Labs: Recent Labs  Lab 11/13/17 0414  LATICACIDVEN 1.1    Recent Results (from the past 240 hour(s))  Culture, blood (routine x 2)     Status: None (Preliminary result)   Collection Time: 11/12/17 10:30 AM  Result Value Ref Range Status   Specimen Description BLOOD RIGHT ANTECUBITAL  Final   Special Requests   Final    BOTTLES DRAWN AEROBIC AND ANAEROBIC Blood Culture adequate volume   Culture   Final    NO GROWTH 3 DAYS Performed at [facility], [address], [place], [place] [address]    Report Status PENDING  Incomplete  Culture, blood (routine x 2)     Status: None (Preliminary result)   Collection Time: 11/12/17 10:35 AM  Result Value Ref Range Status   Specimen Description BLOOD LEFT ANTECUBITAL  Final   Special Requests   Final     BOTTLES DRAWN AEROBIC AND ANAEROBIC Blood Culture adequate volume   Culture   Final    NO GROWTH 3 DAYS Performed at [facility], [place] [address] [place], [place] [address]    Report Status PENDING  Incomplete  MRSA PCR Screening     Status: Abnormal   Collection Time: 11/15/17  8:04 AM  Result Value Ref Range Status   MRSA by PCR POSITIVE (A) NEGATIVE Final    Comment:        The GeneXpert MRSA Assay (FDA approved for NASAL specimens only), is one component of a comprehensive MRSA colonization surveillance program. It is not intended to diagnose MRSA infection nor to guide or monitor treatment for MRSA infections. RESULT CALLED TO, READ BACK BY AND VERIFIED WITH: TJerl Mina RN AT 1238 11/15/17 BY D. [facility]      Radiology Studies: No results found.  Scheduled Meds: . amLODipine  2.5 mg Oral QHS  . atenolol  50 mg Oral Daily  . enoxaparin (LOVENOX) injection  40 mg Subcutaneous Q24H  . feeding supplement (ENSURE ENLIVE)  237 mL Oral BID BM  . feeding supplement (PRO-STAT SUGAR FREE 64)  30 mL Oral BID  . insulin aspart  0-15 Units Subcutaneous TID WC  . insulin aspart  0-5 Units Subcutaneous QHS  . insulin glargine  10 Units Subcutaneous Daily  . multivitamin with minerals  1 tablet Oral Daily   Continuous Infusions: . meropenem (MERREM) IV 1 g (11/15/17 1432)  . vancomycin 1,000 mg (11/15/17 1626)     LOS: 11 days   Marylu Lund, MD [facility] Pager [phone]  If 7PM-7AM, please contact night-coverage [email or web address] Password [facility] 11/15/2017, 5:29 PM

## 2017-11-15 NOTE — Progress Notes (Signed)
Pharmacy Antibiotic Note  Paul [name] is a 37 y.o. male admitted on 11/03/2017 with pneumonia.  Pharmacy has been consulted for Vancomycin dosing. Also on Meropenem.  WBC 19.5 (slowly trending down). SCr improved at 0.98. Lactic acid within normal limits.   Vancomycin trough this AM is therapeutic at 19 on 1000 mg IV every 8 hours.   Plan: Continue Vancomycin 1000 mg IV every 8 hours. Continue Meropenem  1000 mg IV every 8 hours.  Monitor renal function, culture results, and clinical status.   Height: 5\' 10"  (177.8 cm) Weight: 194 lb 14.2 oz (88.4 kg) IBW/kg (Calculated) : 73  Temp (24hrs), Avg:98.9 F (37.2 C), Min:98.4 F (36.9 C), Max:99.9 F (37.7 C)  Recent Labs  Lab 11/10/17 0419 11/11/17 0529 11/12/17 0423 11/13/17 0414 11/14/17 0556 11/15/17 0536  WBC 15.3*  --  20.4* 19.5* 16.1* 14.0*  CREATININE 1.21 1.11 1.05 0.98 1.01 1.00  LATICACIDVEN  --   --   --  1.1  --   --   VANCOTROUGH  --   --   --   --   --  19    Estimated Creatinine Clearance: 114.4 mL/min (by C-G formula based on SCr of 1 mg/dL).    No Known Allergies  Antimicrobials this admission: Vancomycin 8/15 >> Meropenem 8/15 >>  Dose adjustments this admission:   Microbiology results: 8/15 BCx: ngtd x2 8/18 MRSA PCR:   Thank you for allowing pharmacy to be a part of this patient's care.  Sloan Leiter, PharmD, BCPS, BCCCP Clinical Pharmacist Clinical phone 11/15/2017 until 3:30PM [phone] After hours, please call #[phone] 11/15/2017 9:28 AM

## 2017-11-16 ENCOUNTER — Inpatient Hospital Stay (HOSPITAL_COMMUNITY): Payer: 59

## 2017-11-16 LAB — GLUCOSE, CAPILLARY
GLUCOSE-CAPILLARY: 221 mg/dL — AB (ref 70–99)
GLUCOSE-CAPILLARY: 216 mg/dL — AB (ref 70–99)
GLUCOSE-CAPILLARY: 131 mg/dL — AB (ref 70–99)
Glucose-Capillary: 204 mg/dL — ABNORMAL HIGH (ref 70–99)

## 2017-11-16 LAB — CBC
HCT: 29 % — ABNORMAL LOW (ref 39.0–52.0)
Hemoglobin: 9.4 g/dL — ABNORMAL LOW (ref 13.0–17.0)
MCH: 28.6 pg (ref 26.0–34.0)
MCHC: 32.4 g/dL (ref 30.0–36.0)
MCV: 88.1 fL (ref 78.0–100.0)
Platelets: 670 10*3/uL — ABNORMAL HIGH (ref 150–400)
RBC: 3.29 MIL/uL — AB (ref 4.22–5.81)
RDW: 14.9 % (ref 11.5–15.5)
WBC: 13.8 10*3/uL — ABNORMAL HIGH (ref 4.0–10.5)

## 2017-11-16 LAB — COMPREHENSIVE METABOLIC PANEL
ALBUMIN: 2.5 g/dL — AB (ref 3.5–5.0)
ALK PHOS: 137 U/L — AB (ref 38–126)
ALT: 71 U/L — ABNORMAL HIGH (ref 0–44)
AST: 62 U/L — AB (ref 15–41)
Anion gap: 11 (ref 5–15)
BILIRUBIN TOTAL: 1.5 mg/dL — AB (ref 0.3–1.2)
BUN: 13 mg/dL (ref 6–20)
CALCIUM: 8.2 mg/dL — AB (ref 8.9–10.3)
CO2: 24 mmol/L (ref 22–32)
Chloride: 98 mmol/L (ref 98–111)
Creatinine, Ser: 0.95 mg/dL (ref 0.61–1.24)
Glucose, Bld: 215 mg/dL — ABNORMAL HIGH (ref 70–99)
Potassium: 3.9 mmol/L (ref 3.5–5.1)
SODIUM: 133 mmol/L — AB (ref 135–145)
TOTAL PROTEIN: 6.8 g/dL (ref 6.5–8.1)

## 2017-11-16 MED ORDER — INSULIN ASPART 100 UNIT/ML ~~LOC~~ SOLN
5.0000 [IU] | Freq: Three times a day (TID) | SUBCUTANEOUS | Status: DC
  Administered 2017-11-16 – 2017-11-18 (×6): 5 [IU] via SUBCUTANEOUS

## 2017-11-16 MED ORDER — PRO-STAT SUGAR FREE PO LIQD
30.0000 mL | Freq: Three times a day (TID) | ORAL | Status: DC
  Administered 2017-11-16 – 2017-11-18 (×8): 30 mL via ORAL
  Filled 2017-11-16 (×7): qty 30

## 2017-11-16 MED ORDER — IOPAMIDOL (ISOVUE-300) INJECTION 61%
INTRAVENOUS | Status: AC
  Filled 2017-11-16: qty 100

## 2017-11-16 MED ORDER — IOPAMIDOL (ISOVUE-300) INJECTION 61%
INTRAVENOUS | Status: AC
  Administered 2017-11-16: 100 mL
  Filled 2017-11-16: qty 30

## 2017-11-16 MED ORDER — INSULIN GLARGINE 100 UNIT/ML ~~LOC~~ SOLN
13.0000 [IU] | Freq: Every day | SUBCUTANEOUS | Status: DC
  Administered 2017-11-17 – 2017-11-18 (×2): 13 [IU] via SUBCUTANEOUS
  Filled 2017-11-16 (×2): qty 0.13

## 2017-11-16 NOTE — Progress Notes (Signed)
PROGRESS NOTE    Paul [name]  QIO:[id number] DOB: [birth year] DOA: 11/03/2017 PCP: Karleen Hampshire., MD    Brief Narrative:  37 year old male with history of hypertension hypertriglyceridemia, history of renal cell carcinoma status post left nephrectomy in 2012 in [place] admitted overnight with severe acute pancreatitis, and mild AKI. -GI following -hospitalization complicated by hypoxia/volume overload-improved -Now with fevers, leukocytosis -follow up CT abd with findings worrisome for pancreatic necrosis  Assessment & Plan:   Principal Problem:   Acute pancreatitis Active Problems:   ARF (acute renal failure) (HCC)   Essential hypertension   Abnormal LFTs   1. Acute pancreatitis -Etiology uncertain -no history of heavy EtOH use (although may not be forthcoming), triglycerides within normal range, no gallstones noted on CT, IgG4 within normal limits -clinical improvement noted as pt tolerating diet without difficulty -IV fluids discontinued 8/9 due to volume overload -now with fevers and bloating -Follow CT abd on 8/13 worrisome for development of pancreatic necrosis -continued on meropenem -RUQ [place] noted, no dilated CBD, no stones, GB sludge noted -GI following. Patient remains on empiric meropenem as already ordered. Continues to tolerate PO without difficulty -Liver function improving over time. -Low grade temps noted this AM. CT abd/pelvis ordered by GI, pending  2. Fluid overload/Pleural effusions -Due to aggressive fluid resuscitation and third spacing from severe pancreatitis/hypoalbuminemia -diuresed aggressively with low-dose Lasix 8/9, -improved after additional diuresis -Remains on room air, ambulating without difficulty  3. Acute kidney injury -Baseline creatinine is 1.1, this admission has been in 1.5 range, likely ATN from above -renal function has normalized and remains stable -check bmet in AM  4. History of renal cell carcinoma,  -Patient is status  post left-sided nephrectomy in 2012 - Patient to follow up with Urology - Continues to void welll  5. Hypertension/rebound tachycardia -continue atenolol as tolerated -HR has improved, occasional tachycardia that resolves -BP is trending up with DBP into the 100's -Resumed home 2.5mg  norvasc  6. Hyperglycemia -from pancreatic dysfunction from inflammation, hemoglobin A1c was 5.5 -glucose persistently in the 200's -Currently on 10 units lantus daily with 2 units meal coverage with SSI coverage -Glucose remains in the 200's -Appreciate input by diabetic coordinator. Recommendations to increase lantus to 13 units and meal coverage to 5 units. Ordered  7. CAP -Patient with peak WBC of 20k with fevers noted -Blood cultures neg x 2 -UA reviewed with moderate blood, otherwise not suggestive of UTI -Recent CXR reviewed with findings of L sided consolidation and small L pleural effusion suggesting PNA -Continue on vancomycin with meropenem -WBC with continued improvement to 13k range -MRSA swab pos, thus would cont vanc for now. Anticipate completing 5 days of vanc. Stop date of vanc through 8/20  8. Sinus Tach -Improved -continued on atenolol -Continue PRN labetalol for tachycardia -Currently stable  DVT prophylaxis: Lovenox subQ Code Status: Full Family Communication: Pt in room, family at bedside Disposition Plan: Uncertain at this time  Consultants:   GI  Procedures:     Antimicrobials: Anti-infectives (From admission, onward)   Start     Dose/Rate Route Frequency Ordered Stop   11/12/17 2230  vancomycin (VANCOCIN) IVPB 1000 mg/200 mL premix     1,000 mg 200 mL/hr over 60 Minutes Intravenous Every 8 hours 11/12/17 1407 11/17/17 2229   11/12/17 1415  vancomycin (VANCOCIN) 1,500 mg in sodium chloride 0.9 % 500 mL IVPB     1,500 mg 250 mL/hr over 120 Minutes Intravenous  Once 11/12/17 1407 11/12/17 1649   11/10/17  1600  meropenem (MERREM) 1 g in sodium chloride 0.9 %  100 mL IVPB     1 g 200 mL/hr over 30 Minutes Intravenous Every 8 hours 11/10/17 1552        Subjective: Without complaints this AM  Objective: Vitals:   11/16/17 0430 11/16/17 0500 11/16/17 0856 11/16/17 1252  BP: (!) 145/102  (!) 146/93 (!) 145/103  Pulse: 98  (!) 110 93  Resp: 18  16 15   Temp: 99.5 F (37.5 C)  98 F (36.7 C) 98.6 F (37 C)  TempSrc: Oral  Oral Oral  SpO2: 97%  98% 97%  Weight:  85 kg    Height:        Intake/Output Summary (Last 24 hours) at 11/16/2017 1622 Last data filed at 11/16/2017 0200 Gross per 24 hour  Intake 240 ml  Output -  Net 240 ml   Filed Weights   11/10/17 0350 11/11/17 0349 11/16/17 0500  Weight: 89.2 kg 88.4 kg 85 kg    Examination: General exam: Awake, laying in bed, in nad Respiratory system: Normal respiratory effort, no wheezing Cardiovascular system: regular rate, s1, s2 Gastrointestinal system: Soft, nondistended, positive BS Central nervous system: CN2-12 grossly intact, strength intact Extremities: Perfused, no clubbing Skin: Normal skin turgor, no notable skin lesions seen Psychiatry: Mood normal // no visual hallucinations   Data Reviewed: I have personally reviewed following labs and imaging studies  CBC: Recent Labs  Lab 11/12/17 0423 11/13/17 0414 11/14/17 0556 11/15/17 0536 11/16/17 0524  WBC 20.4* 19.5* 16.1* 14.0* 13.8*  NEUTROABS  --  16.3*  --   --   --   HGB 9.8* 10.2* 9.2* 9.5* 9.4*  HCT 28.2* 29.7* 28.0* 29.7* 29.0*  MCV 83.9 84.6 86.4 87.4 88.1  PLT 362 541* 579* 462* 782*   Basic Metabolic Panel: Recent Labs  Lab 11/12/17 0423 11/13/17 0414 11/14/17 0556 11/15/17 0536 11/16/17 0524  NA 129* 135 131* 133* 133*  K 3.7 3.5 3.8 4.3 3.9  CL 97* 100 98 100 98  CO2 19* 24 21* 23 24  GLUCOSE 209* 219* 212* 236* 215*  BUN 9 8 10 11 13   CREATININE 1.05 0.98 1.01 1.00 0.95  CALCIUM 7.7* 8.3* 8.0* 8.2* 8.2*  MG  --   --  2.1  --   --   PHOS  --   --  2.9  --   --    GFR: Estimated  Creatinine Clearance: 111 mL/min (by C-G formula based on SCr of 0.95 mg/dL). Liver Function Tests: Recent Labs  Lab 11/12/17 0423 11/13/17 0414 11/14/17 0556 11/15/17 0536 11/16/17 0524  AST 73* 66* 70* 50* 62*  ALT 70* 73* 71* 63* 71*  ALKPHOS 111 149* 153* 145* 137*  BILITOT 2.9* 1.9* 1.8* 1.5* 1.5*  PROT 5.6* 6.3* 5.9* 6.6 6.8  ALBUMIN 2.3* 2.5* 2.3* 2.4* 2.5*   Recent Labs  Lab 11/11/17 0529  LIPASE 67*   No results for input(s): AMMONIA in the last 168 hours. Coagulation Profile: Recent Labs  Lab 11/13/17 0414  INR 1.07   Cardiac Enzymes: No results for input(s): CKTOTAL, CKMB, CKMBINDEX, TROPONINI in the last 168 hours. BNP (last 3 results) No results for input(s): PROBNP in the last 8760 hours. HbA1C: No results for input(s): HGBA1C in the last 72 hours. CBG: Recent Labs  Lab 11/15/17 1126 11/15/17 1640 11/15/17 2251 11/16/17 0647 11/16/17 1120  GLUCAP 268* 260* 160* 221* 216*   Lipid Profile: No results for input(s): CHOL, HDL, LDLCALC,  TRIG, CHOLHDL, LDLDIRECT in the last 72 hours. Thyroid Function Tests: No results for input(s): TSH, T4TOTAL, FREET4, T3FREE, THYROIDAB in the last 72 hours. Anemia Panel: No results for input(s): VITAMINB12, FOLATE, FERRITIN, TIBC, IRON, RETICCTPCT in the last 72 hours. Sepsis Labs: Recent Labs  Lab 11/13/17 0414  LATICACIDVEN 1.1    Recent Results (from the past 240 hour(s))  Culture, blood (routine x 2)     Status: None (Preliminary result)   Collection Time: 11/12/17 10:30 AM  Result Value Ref Range Status   Specimen Description BLOOD RIGHT ANTECUBITAL  Final   Special Requests   Final    BOTTLES DRAWN AEROBIC AND ANAEROBIC Blood Culture adequate volume   Culture   Final    NO GROWTH 4 DAYS Performed at [facility], [address] [place], [place] [address]    Report Status PENDING  Incomplete  Culture, blood (routine x 2)     Status: None (Preliminary result)   Collection Time: 11/12/17 10:35  AM  Result Value Ref Range Status   Specimen Description BLOOD LEFT ANTECUBITAL  Final   Special Requests   Final    BOTTLES DRAWN AEROBIC AND ANAEROBIC Blood Culture adequate volume   Culture   Final    NO GROWTH 4 DAYS Performed at [facility], [place] [address] [place], [place] [address]    Report Status PENDING  Incomplete  MRSA PCR Screening     Status: Abnormal   Collection Time: 11/15/17  8:04 AM  Result Value Ref Range Status   MRSA by PCR POSITIVE (A) NEGATIVE Final    Comment:        The GeneXpert MRSA Assay (FDA approved for NASAL specimens only), is one component of a comprehensive MRSA colonization surveillance program. It is not intended to diagnose MRSA infection nor to guide or monitor treatment for MRSA infections. RESULT CALLED TO, READ BACK BY AND VERIFIED WITH: TJerl Mina RN AT 1238 11/15/17 BY D. [facility]      Radiology Studies: No results found.  Scheduled Meds: . iopamidol      . amLODipine  2.5 mg Oral QHS  . atenolol  50 mg Oral Daily  . enoxaparin (LOVENOX) injection  40 mg Subcutaneous Q24H  . feeding supplement (PRO-STAT SUGAR FREE 64)  30 mL Oral TID BM  . insulin aspart  0-15 Units Subcutaneous TID WC  . insulin aspart  0-5 Units Subcutaneous QHS  . insulin aspart  5 Units Subcutaneous TID WC  . [START ON 11/17/2017] insulin glargine  13 Units Subcutaneous Daily  . iopamidol      . multivitamin with minerals  1 tablet Oral Daily   Continuous Infusions: . meropenem (MERREM) IV 1 g (11/16/17 1333)  . vancomycin 1,000 mg (11/16/17 1425)     LOS: 12 days   Marylu Lund, MD [facility] Pager [phone]  If 7PM-7AM, please contact night-coverage [email or web address] Password [facility] 11/16/2017, 4:22 PM

## 2017-11-16 NOTE — Progress Notes (Signed)
Calorie Count Note  48 hour calorie count ordered.  Spoke with pt at bedside, who is in good spirits today. He reports appetite is improving; is consuming "almost all" of his meals. He consumed lactaid milk, fruit, and yogurt this morning. He is refusing the Ensure supplements as "they don't agree with me", but is amenable to continue Prostat supplements.   Diet: Carb Modified Supplements: Ensure Enlive po BID, each supplement provides 350 kcal and 20 grams of protein; 30 ml Prostat BID, each supplement provides 100 kcals and 100 grams protein; MVI with minerals daily  8/15-8/16 Breakfast: 443 kcals and 20 grams protein Lunch: 145 kcals, 5 grams protein Dinner: 440 kcals, 26 grams protein Supplements: 1 Ensure supplement (350 kcals and 20 grams protein), refused AM dose; 2 Prostats (200 kcals and 30 grams protein)  Total intake: 1578 kcal (66% of minimum estimated needs)  101 grams protein (81% of minimum estimated needs)  8/16-8/17 Breakfast: 205 kcals, 20 grams protein Lunch: 574 kcals, 35 grams protein Dinner: 241 kcals, 9 grams protein Supplements: 1 Ensure Enlives (350 kcals, 20 grams protein), 2 Prostats (200 kcals and 30 grams protein)  Total intake: 1570 kcal (65% of minimum estimated needs)  94 grams protein (75% of minimum estimated needs)  Total intake: 1574 kcal (66% of minimum estimated needs)  98 grams protein (78% of minimum estimated needs)  Nutrition Dx: Inadequate oral intakerelated to acute illness, altered GI function (acute pancreatitis)as evidenced by meal completion < 50%; progressing  Goal: Patient will meet greater than or equal to 90% of their needs; progressing  Intervention:  -D/c calorie count -D/c Ensure Enlive po BID, each supplement provides 350 kcal and 20 grams of protein -Increase Prostat to TID, each supplement provides 100 kcals and 15 grams protein -Continue MVI daily  Embry Huss A. Jimmye Norman, RD, LDN, CDE Pager: [phone] After hours  Pager: [phone]

## 2017-11-16 NOTE — Progress Notes (Signed)
Inpatient Diabetes Program Recommendations  AACE/ADA: New Consensus Statement on Inpatient Glycemic Control (2015)  Target Ranges:  Prepandial:   less than 140 mg/dL      Peak postprandial:   less than 180 mg/dL (1-2 hours)      Critically ill patients:  140 - 180 mg/dL   Results for Paul [name], Paul [name] (MRN [id number]) as of 11/16/2017 13:20  Ref. Range 11/15/2017 05:52 11/15/2017 11:26 11/15/2017 16:40 11/15/2017 22:51 11/16/2017 06:47 11/16/2017 11:20  Glucose-Capillary Latest Ref Range: 70 - 99 mg/dL 240 (H) 268 (H) 260 (H) 160 (H) 221 (H) 216 (H)  Results for Paul [name], Paul [name] (MRN [id number]) as of 11/16/2017 13:20  Ref. Range 11/04/2017 07:44  Hemoglobin A1C Latest Ref Range: 4.8 - 5.6 % 5.5   Review of Glycemic Control  Diabetes history: No Outpatient Diabetes medications: NA Current orders for Inpatient glycemic control: Lantus 10 units daily, Novolog 0-15 units TID with meals, Novolog 0-5 units QHS, Novolog 2 units TID with meals for meal coverage  Inpatient Diabetes Program Recommendations:  Insulin - Basal: Please consider increasing Lantus to 13 units daily. Insulin - Meal Coverage: Please consider increasing meal coverage to Novolog 5 units TID with meals if patient eats at least 50% of meals. HgbA1C: A1C 5.5% on 11/04/17 indicating an average glucose of 111 mg/dl of the past 2-3 months.  Thanks, Barnie Alderman, RN, MSN, CDE Diabetes Coordinator Inpatient Diabetes Program [phone] (Team Pager from 8am to 5pm)

## 2017-11-16 NOTE — Progress Notes (Signed)
Gastroenterology Inpatient Follow-up Note   PATIENT IDENTIFICATION  Paul [name] is a 37 y.o. male with a pmh significant for GERD, HTN, HLD, Migraines, s/p Nephrectomy who presented with acute pancreatitis (unclear etiology vs Fenofibrate vs Biliary sludge) who has developing necrosis. Hospital Day: 14  SUBJECTIVE  Patient overall is feeling well. Patient had a fever yesterday in the late evening. White count continues to be stable and decreased from previous. Patient is tolerating oral diet without issues. 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   OBJECTIVE  Scheduled Inpatient Medications:  . amLODipine  2.5 mg Oral QHS  . atenolol  50 mg Oral Daily  . enoxaparin (LOVENOX) injection  40 mg Subcutaneous Q24H  . feeding supplement (PRO-STAT SUGAR FREE 64)  30 mL Oral TID BM  . insulin aspart  0-15 Units Subcutaneous TID WC  . insulin aspart  0-5 Units Subcutaneous QHS  . insulin aspart  5 Units Subcutaneous TID WC  . [START ON 11/17/2017] insulin glargine  13 Units Subcutaneous Daily  . iopamidol      . multivitamin with minerals  1 tablet Oral Daily   Continuous Inpatient Infusions:  . meropenem (MERREM) IV 1 g (11/16/17 1333)  . vancomycin 1,000 mg (11/16/17 1425)   PRN Inpatient Medications: acetaminophen **OR** acetaminophen, ALPRAZolam, diphenhydrAMINE-zinc acetate, gi cocktail, hydrALAZINE, HYDROcodone-acetaminophen, HYDROmorphone (DILAUDID) injection, hydrOXYzine, ibuprofen, labetalol, ondansetron **OR** ondansetron (ZOFRAN) IV   Physical Examination  Temp:  [98 F (36.7 C)-100.3 F (37.9 C)] 100.3 F (37.9 C) (08/19 2038) Pulse Rate:  [89-110] 89 (08/19 2038) Resp:  [15-20] 20 (08/19 2038) BP: (135-147)/(93-103) 135/98 (08/19 2038) SpO2:  [97 %-98 %] 98 % (08/19 2038) Weight:  [187 lb 6.3 oz (85 kg)] 187 lb 6.3 oz (85 kg) (08/19 0500) Temp (24hrs), Avg:99.5 F (37.5 C), Min:98 F (36.7 C), Max:100.3 F (37.9 C)  Weight: 187 lb 6.3 oz (85  kg) GEN: NAD, resting comfortably in bed with his wife at his side PSYCH: Cooperative EYE: Sclerae anicteric ENT: MMM CV: Without rubs or gallops RESP: Lung bases with decreased breath sounds GI: NABS, soft, NT, mildly protuberant, without rebound or guarding MSK/EXT: No lower extremity edema is present SKIN: No jaundice NEURO:  Alert & Oriented x 3, no focal deficits, no evidence of asterixis   Review of Data   Laboratory Studies   Recent Labs  Lab 11/14/17 0556  11/16/17 0524  NA 131*   < > 133*  K 3.8   < > 3.9  CL 98   < > 98  CO2 21*   < > 24  BUN 10   < > 13  CREATININE 1.01   < > 0.95  GLUCOSE 212*   < > 215*  CALCIUM 8.0*   < > 8.2*  MG 2.1  --   --   PHOS 2.9  --   --    < > = values in this interval not displayed.   Recent Labs  Lab 11/16/17 0524  AST 62*  ALT 71*  ALKPHOS 137*    Recent Labs  Lab 11/14/17 0556 11/15/17 0536 11/16/17 0524  WBC 16.1* 14.0* 13.8*  HGB 9.2* 9.5* 9.4*  HCT 28.0* 29.7* 29.0*  PLT 579* 462* 670*   Recent Labs  Lab 11/13/17 0414  INR 1.07    Imaging Studies  No new studies today  GI Procedures and Studies  No new studies   ASSESSMENT  Paul [name] is a 37 y.o. male with a  pmh significant for GERD, HTN, HLD, Migraines, s/p Nephrectomy who presented with acute pancreatitis (unclear etiology vs Fenofibrate vs Biliary sludge) who has developing necrosis.  I am really hoping that the patient's low-grade fevers are result of just persistent pancreatitis rather than development of infected necrosis based on his overall clinical status.  He looks remarkably well compared to what his previous imaging is shown.  I am however concerned that if fevers persist that we may not have the ability to just continue to monitor without some sort of drainage procedure.  I recommend we monitor him today.  I do hope that we can start working on the patient being able to be mobilized for possible discharge if things are stable and there are no  evidence of recurrent fever or worsening leukocytosis.  We will I think it would be reasonable to discuss with the care management team that whether the patient can get a carbapenem as an outpatient for at least 3 weeks.  If his fevers persist and we have with any further issues then I think drainage will be necessary and we may also want to have a infectious disease consultation.  PLAN/RECOMMENDATIONS  Plan for repeat CTAP on 8/20 in AM to evaluate abdominal cavity (order placed for tomorrow) Would plan to start working with [place] about antibiotic therapy For now would begin to plan at least 3-full weeks since initiation of Carbapenem to continue and thus if he needs this as outpatient, will he require an IV vs IM formulation (he likely will complete his HCAP PNA treatment while inpatient) Hold on sampling/draining fluid collection if possible as his GI symptoms are not consistent with need for intervention - yet  Discharge Planning Diet: Low Fat Diet Anticoagulation and antiplatelets: As per Medical service Discharge Medications: Will need antibiotics Follow up: Unclear as of yet but will need GI follow-up  Please page/call with questions or concerns.   Justice Britain, MD [facility] Office # [phone]   LOS: 12 days  Irving Copas  11/16/2017, 9:25 PM

## 2017-11-17 ENCOUNTER — Inpatient Hospital Stay: Payer: Self-pay

## 2017-11-17 DIAGNOSIS — R509 Fever, unspecified: Secondary | ICD-10-CM

## 2017-11-17 DIAGNOSIS — R935 Abnormal findings on diagnostic imaging of other abdominal regions, including retroperitoneum: Secondary | ICD-10-CM

## 2017-11-17 LAB — CBC
HCT: 32.3 % — ABNORMAL LOW (ref 39.0–52.0)
Hemoglobin: 10.5 g/dL — ABNORMAL LOW (ref 13.0–17.0)
MCH: 28.6 pg (ref 26.0–34.0)
MCHC: 32.5 g/dL (ref 30.0–36.0)
MCV: 88 fL (ref 78.0–100.0)
PLATELETS: 736 10*3/uL — AB (ref 150–400)
RBC: 3.67 MIL/uL — AB (ref 4.22–5.81)
RDW: 14.6 % (ref 11.5–15.5)
WBC: 12.5 10*3/uL — ABNORMAL HIGH (ref 4.0–10.5)

## 2017-11-17 LAB — COMPREHENSIVE METABOLIC PANEL
ALT: 72 U/L — AB (ref 0–44)
ANION GAP: 8 (ref 5–15)
AST: 50 U/L — ABNORMAL HIGH (ref 15–41)
Albumin: 2.7 g/dL — ABNORMAL LOW (ref 3.5–5.0)
Alkaline Phosphatase: 138 U/L — ABNORMAL HIGH (ref 38–126)
BUN: 13 mg/dL (ref 6–20)
CHLORIDE: 99 mmol/L (ref 98–111)
CO2: 23 mmol/L (ref 22–32)
CREATININE: 1.01 mg/dL (ref 0.61–1.24)
Calcium: 8.4 mg/dL — ABNORMAL LOW (ref 8.9–10.3)
GFR calc non Af Amer: >60 mL/min (ref >60)
Glucose, Bld: 221 mg/dL — ABNORMAL HIGH (ref 70–99)
Potassium: 3.9 mmol/L (ref 3.5–5.1)
SODIUM: 130 mmol/L — AB (ref 135–145)
Total Bilirubin: 1.4 mg/dL — ABNORMAL HIGH (ref 0.3–1.2)
Total Protein: 6.8 g/dL (ref 6.5–8.1)

## 2017-11-17 LAB — CULTURE, BLOOD (ROUTINE X 2)
Culture: NO GROWTH
Culture: NO GROWTH
Special Requests: ADEQUATE
Special Requests: ADEQUATE

## 2017-11-17 LAB — GLUCOSE, CAPILLARY
GLUCOSE-CAPILLARY: 231 mg/dL — AB (ref 70–99)
GLUCOSE-CAPILLARY: 239 mg/dL — AB (ref 70–99)
Glucose-Capillary: 208 mg/dL — ABNORMAL HIGH (ref 70–99)
Glucose-Capillary: 116 mg/dL — ABNORMAL HIGH (ref 70–99)

## 2017-11-17 LAB — PROTIME-INR
INR: 1.07
PROTHROMBIN TIME: 13.8 s (ref 11.4–15.2)

## 2017-11-17 MED ORDER — SODIUM CHLORIDE 0.9% FLUSH: 10.0000 mL | INTRAVENOUS | Status: DC | PRN

## 2017-11-17 MED ORDER — SODIUM CHLORIDE 0.9% FLUSH
10.0000 mL | Freq: Two times a day (BID) | INTRAVENOUS | Status: DC
  Administered 2017-11-17 – 2017-11-18 (×2): 10 mL

## 2017-11-17 NOTE — Care Management Note (Addendum)
Case Management Note  Patient Details  Name: Paul [name] MRN: [id number] Date of Birth: 1981/03/16  Subjective/Objective:                    Action/Plan: Plan is for PICC and home IV antibiotics. CM will provide patient choice of [facility] agencies. CM following.  Addendum: provided patient choice of [place] agencies and he selected [facility]. Pam with [facility] notified and accepted the referral.   Expected Discharge Date:                  Expected Discharge Plan:  Home/Self Care  In-House Referral:     Discharge planning Services     Post Acute Care Choice:    Choice offered to:     DME Arranged:    DME Agency:     HH Arranged:    HH Agency:     Status of Service:  In process, will continue to follow  If discussed at Long Length of Stay Meetings, dates discussed:    Additional Comments:  Pollie Friar, RN 11/17/2017, 11:20 AM

## 2017-11-17 NOTE — Progress Notes (Signed)
PHARMACY CONSULT NOTE FOR:  OUTPATIENT  PARENTERAL ANTIBIOTIC THERAPY (OPAT)  Indication: necrotizing pancreatitis Regimen: Ertapenem 1gm IV q24h End date: 12/01/17  D/w Dr. Wyline Copas. Will d/c on ertapenem for daily therapy for remainder of therapy.  IV antibiotic discharge orders are pended. To discharging provider:  please sign these orders via discharge navigator,  Select New Orders & click on the button choice - Manage This Unsigned Work.    Tamiya Colello A. Levada Dy, PharmD, [place] Pager: [phone] Please utilize Amion for appropriate phone number to reach the unit pharmacist ([place])    11/17/2017, 1:42 PM

## 2017-11-17 NOTE — Progress Notes (Signed)
Inpatient Diabetes Program Recommendations  AACE/ADA: New Consensus Statement on Inpatient Glycemic Control (2015)  Target Ranges:  Prepandial:   less than 140 mg/dL      Peak postprandial:   less than 180 mg/dL (1-2 hours)      Critically ill patients:  140 - 180 mg/dL   Lab Results  Component Value Date   GLUCAP 208 (H) 11/17/2017   HGBA1C 5.5 11/04/2017    Review of Glycemic Control Results for DEVION, CHRISCOE (MRN [id number]) as of 11/17/2017 15:00  Ref. Range 11/16/2017 17:05 11/16/2017 21:44 11/17/2017 06:40 11/17/2017 12:09  Glucose-Capillary Latest Ref Range: 70 - 99 mg/dL 204 (H) 131 (H) 231 (H) 208 (H)   Diabetes history: No Outpatient Diabetes medications: NA Current orders for Inpatient glycemic control: Lantus 13 units daily, Novolog 0-15 units TID with meals, Novolog 0-5 units QHS, Novolog 5 units TID with meals for meal coverage  Inpatient Diabetes Program Recommendations:   Would recommend switching meal coverage order to the glycemic control order set to ensure administration safety. This ensures that patient will only receive meal coverage if patient consumes >50% of meal.   Thanks, Bronson Curb, MSN, RNC-OB Diabetes Coordinator [phone] (8a-5p)

## 2017-11-17 NOTE — Progress Notes (Signed)
PROGRESS NOTE    Paul [name]  TOI:[id number] DOB: Jan 19, 1981 DOA: 11/03/2017 PCP: Karleen Hampshire., MD    Brief Narrative:  37 year old male with history of hypertension hypertriglyceridemia, history of renal cell carcinoma status post left nephrectomy in 2012 in [place] admitted overnight with severe acute pancreatitis, and mild AKI. -GI following -hospitalization complicated by hypoxia/volume overload-improved -Now with fevers, leukocytosis -follow up CT abd with findings worrisome for pancreatic necrosis  Assessment & Plan:   Principal Problem:   Acute pancreatitis Active Problems:   ARF (acute renal failure) (HCC)   Essential hypertension   Abnormal LFTs   1. Acute pancreatitis -Etiology uncertain -no history of heavy EtOH use (although may not be forthcoming), triglycerides within normal range, no gallstones noted on CT, IgG4 within normal limits -clinical improvement noted as pt tolerating diet without difficulty -IV fluids discontinued 8/9 due to volume overload -now with fevers and bloating -Follow CT abd on 8/13 worrisome for development of pancreatic necrosis -continued on meropenem -RUQ [place] noted, no dilated CBD, no stones, GB sludge noted -GI following. Patient remains on empiric meropenem as already ordered. Continues to tolerate PO without difficulty -Liver function improving over time. -CT abd/pelvis on 8/19 with findings of diffuse necrotizing pancreatitis without significant interval change. Lesser sac peripancreatic fluid collection slightly decreased in size but with increased wall thickening R greater than L retroperitoneal inflammatory changes slightly decreased -GI recommendations for total 3 weeks of abx on discharge (stop date 9/3) of ertapenem. -OPAT ordered -PICC requested -HHRN orders placed  2. Fluid overload/Pleural effusions -Due to aggressive fluid resuscitation and third spacing from severe pancreatitis/hypoalbuminemia -diuresed aggressively with  low-dose Lasix 8/9, -improved after additional diuresis -Appears comfortable on room air, breathing comfortably  3. Acute kidney injury -Baseline creatinine is 1.1, this admission has been in 1.5 range, likely ATN from above -renal function has normalized and remains stable -Remains stable and improved  4. History of renal cell carcinoma,  -Patient is status post left-sided nephrectomy in 2012 - Patient to follow up with Urology -patient voiding  5. Hypertension/rebound tachycardia -continue atenolol as tolerated -HR noted to have improved, occasional tachycardia that resolves -Resumed home 2.5mg  norvasc  6. Hyperglycemia -from pancreatic dysfunction from inflammation, hemoglobin A1c was 5.5 -glucose persistently in the 200's -Currently lantus to 13 units and meal coverage to 5 units. Continue SSI coverage  7. CAP -Patient with peak WBC of 20k with fevers noted -Blood cultures neg x 2 -UA reviewed with moderate blood, otherwise not suggestive of UTI -Recent CXR reviewed with findings of L sided consolidation and small L pleural effusion suggesting PNA -Continued on vancomycin with meropenem -WBC with continued improvement to 12k range -MRSA swab pos, thus would cont vanc for now. Anticipate completing 5 days of vanc. Stop date of vanc through 8/20 -Fevers improved  8. Sinus Tach -continued on atenolol -Continue PRN labetalol for tachycardia -improved  DVT prophylaxis: Lovenox subQ Code Status: Full Family Communication: Pt in room, family at bedside Disposition Plan: Uncertain at this time  Consultants:   GI  Procedures:     Antimicrobials: Anti-infectives (From admission, onward)   Start     Dose/Rate Route Frequency Ordered Stop   11/12/17 2230  vancomycin (VANCOCIN) IVPB 1000 mg/200 mL premix     1,000 mg 200 mL/hr over 60 Minutes Intravenous Every 8 hours 11/12/17 1407 11/17/17 2229   11/12/17 1415  vancomycin (VANCOCIN) 1,500 mg in sodium chloride  0.9 % 500 mL IVPB     1,500 mg 250  mL/hr over 120 Minutes Intravenous  Once 11/12/17 1407 11/12/17 1649   11/10/17 1600  meropenem (MERREM) 1 g in sodium chloride 0.9 % 100 mL IVPB     1 g 200 mL/hr over 30 Minutes Intravenous Every 8 hours 11/10/17 1552        Subjective: Eager to go home  Objective: Vitals:   11/17/17 0325 11/17/17 0500 11/17/17 1039 11/17/17 1212  BP: (!) 144/96  (!) 128/93 (!) 131/99  Pulse: 90  (!) 102 100  Resp: 18  17 18   Temp: 99.7 F (37.6 C)  98.1 F (36.7 C) 98.4 F (36.9 C)  TempSrc: Oral  Oral Oral  SpO2: 96%   97%  Weight:  84.3 kg    Height:       No intake or output data in the 24 hours ending 11/17/17 1459 Filed Weights   11/11/17 0349 11/16/17 0500 11/17/17 0500  Weight: 88.4 kg 85 kg 84.3 kg    Examination: General exam: Conversant, in no acute distress Respiratory system: normal chest rise, clear, no audible wheezing Cardiovascular system: regular rhythm, s1-s2 Gastrointestinal system: Nondistended, nontender, pos BS Central nervous system: No seizures, no tremors Extremities: No cyanosis, no joint deformities Skin: No rashes, no pallor Psychiatry: Affect normal // no auditory hallucinations    Data Reviewed: I have personally reviewed following labs and imaging studies  CBC: Recent Labs  Lab 11/13/17 0414 11/14/17 0556 11/15/17 0536 11/16/17 0524 11/17/17 0803  WBC 19.5* 16.1* 14.0* 13.8* 12.5*  NEUTROABS 16.3*  --   --   --   --   HGB 10.2* 9.2* 9.5* 9.4* 10.5*  HCT 29.7* 28.0* 29.7* 29.0* 32.3*  MCV 84.6 86.4 87.4 88.1 88.0  PLT 541* 579* 462* 670* 371*   Basic Metabolic Panel: Recent Labs  Lab 11/13/17 0414 11/14/17 0556 11/15/17 0536 11/16/17 0524 11/17/17 0803  NA 135 131* 133* 133* 130*  K 3.5 3.8 4.3 3.9 3.9  CL 100 98 100 98 99  CO2 24 21* 23 24 23   GLUCOSE 219* 212* 236* 215* 221*  BUN 8 10 11 13 13   CREATININE 0.98 1.01 1.00 0.95 1.01  CALCIUM 8.3* 8.0* 8.2* 8.2* 8.4*  MG  --  2.1  --   --    --   PHOS  --  2.9  --   --   --    GFR: Estimated Creatinine Clearance: 104.4 mL/min (by C-G formula based on SCr of 1.01 mg/dL). Liver Function Tests: Recent Labs  Lab 11/13/17 0414 11/14/17 0556 11/15/17 0536 11/16/17 0524 11/17/17 0803  AST 66* 70* 50* 62* 50*  ALT 73* 71* 63* 71* 72*  ALKPHOS 149* 153* 145* 137* 138*  BILITOT 1.9* 1.8* 1.5* 1.5* 1.4*  PROT 6.3* 5.9* 6.6 6.8 6.8  ALBUMIN 2.5* 2.3* 2.4* 2.5* 2.7*   Recent Labs  Lab 11/11/17 0529  LIPASE 67*   No results for input(s): AMMONIA in the last 168 hours. Coagulation Profile: Recent Labs  Lab 11/13/17 0414 11/17/17 0803  INR 1.07 1.07   Cardiac Enzymes: No results for input(s): CKTOTAL, CKMB, CKMBINDEX, TROPONINI in the last 168 hours. BNP (last 3 results) No results for input(s): PROBNP in the last 8760 hours. HbA1C: No results for input(s): HGBA1C in the last 72 hours. CBG: Recent Labs  Lab 11/16/17 1120 11/16/17 1705 11/16/17 2144 11/17/17 0640 11/17/17 1209  GLUCAP 216* 204* 131* 231* 208*   Lipid Profile: No results for input(s): CHOL, HDL, LDLCALC, TRIG, CHOLHDL, LDLDIRECT in the last  72 hours. Thyroid Function Tests: No results for input(s): TSH, T4TOTAL, FREET4, T3FREE, THYROIDAB in the last 72 hours. Anemia Panel: No results for input(s): VITAMINB12, FOLATE, FERRITIN, TIBC, IRON, RETICCTPCT in the last 72 hours. Sepsis Labs: Recent Labs  Lab 11/13/17 0414  LATICACIDVEN 1.1    Recent Results (from the past 240 hour(s))  Culture, blood (routine x 2)     Status: None (Preliminary result)   Collection Time: 11/12/17 10:30 AM  Result Value Ref Range Status   Specimen Description BLOOD RIGHT ANTECUBITAL  Final   Special Requests   Final    BOTTLES DRAWN AEROBIC AND ANAEROBIC Blood Culture adequate volume   Culture   Final    NO GROWTH 4 DAYS Performed at [facility], [address], [place], [place] [address]    Report Status PENDING  Incomplete  Culture, blood (routine x  2)     Status: None (Preliminary result)   Collection Time: 11/12/17 10:35 AM  Result Value Ref Range Status   Specimen Description BLOOD LEFT ANTECUBITAL  Final   Special Requests   Final    BOTTLES DRAWN AEROBIC AND ANAEROBIC Blood Culture adequate volume   Culture   Final    NO GROWTH 4 DAYS Performed at [facility], [place] [address], [place], [place] [address]    Report Status PENDING  Incomplete  MRSA PCR Screening     Status: Abnormal   Collection Time: 11/15/17  8:04 AM  Result Value Ref Range Status   MRSA by PCR POSITIVE (A) NEGATIVE Final    Comment:        The GeneXpert MRSA Assay (FDA approved for NASAL specimens only), is one component of a comprehensive MRSA colonization surveillance program. It is not intended to diagnose MRSA infection nor to guide or monitor treatment for MRSA infections. RESULT CALLED TO, READ BACK BY AND VERIFIED WITH: Cleotis Nipper RN AT 1238 11/15/17 BY [facility]      Radiology Studies: Ct Abdomen Pelvis W Contrast  Result Date: 11/16/2017 CLINICAL DATA:  Inpatient. Severe necrotizing pancreatitis. Fever. SIRS. Leukocytosis. EXAM: CT ABDOMEN AND PELVIS WITH CONTRAST TECHNIQUE: Multidetector CT imaging of the abdomen and pelvis was performed using the standard protocol following bolus administration of intravenous contrast. CONTRAST:  138mL ISOVUE-300 IOPAMIDOL (ISOVUE-300) INJECTION 61% COMPARISON:  11/11/2017 CT abdomen/pelvis. FINDINGS: Lower chest: Small dependent left pleural effusion, stable. Mild-to-moderate left and mild right lung base atelectasis. Hepatobiliary: Normal liver size. No liver mass. Normal gallbladder with no radiopaque cholelithiasis. No biliary ductal dilatation. Pancreas: There is diffuse thickening of the pancreas. There is non enhancement of most of the pancreatic parenchyma, compatible with severe necrotizing pancreatitis, not appreciably changed. A few scattered small irregular foci of soft tissue attenuation within  the pancreas (series 3/image 45 in the pancreatic head, image 33 in the pancreatic body and image 38 in the pancreatic tail) are unchanged and probably represent preserved regions of parenchymal enhancement. No pancreatic duct dilation. Lesser sac 9.0 x 4.7 cm fluid collection with increased wall thickening and enhancement (series 3/image 28) is more discretely defined and slightly decreased in size from 10.1 x 5.5 cm. Spleen: Normal size. No mass. Adrenals/Urinary Tract: No discrete adrenal nodules. Status post left nephrectomy. No mass or fluid collection in the left nephrectomy bed. Compensatory hypertrophy of the right kidney. No right hydronephrosis. No right renal mass. Normal bladder. Stomach/Bowel: Normal non-distended stomach. Normal caliber small bowel with no small bowel wall thickening. Normal appendix. Mild wall thickening  in the right colon and splenic flexure of the colon, unchanged, probably reactive. Vascular/Lymphatic: Normal caliber abdominal aorta. Patent hepatic, portal, splenic and right renal veins. No pathologically enlarged lymph nodes in the abdomen or pelvis. Reproductive: Normal size prostate. Other: No pneumoperitoneum. Ill-defined fat stranding and fluid throughout the bilateral anterior paranephric retroperitoneal spaces, right greater than left, slightly decreased. Stable tiny fat containing periumbilical hernia. Musculoskeletal: No aggressive appearing focal osseous lesions. IMPRESSION: 1. Severe diffuse necrotizing pancreatitis without significant interval change. Lesser sac peripancreatic fluid collection is slightly decreased in size but with increased wall thickening. Right greater than left retroperitoneal inflammatory changes are slightly decreased. 2. Small dependent left pleural effusion, stable. 3. Mild wall thickening in the right colon and splenic flexure of the colon, stable, probably reactive. Electronically Signed   By: Ilona Sorrel M.D.   On: 11/16/2017 17:18   [place]  Ekg Site Rite  Result Date: 11/17/2017 If Site Rite image not attached, placement could not be confirmed due to current cardiac rhythm.   Scheduled Meds: . amLODipine  2.5 mg Oral QHS  . atenolol  50 mg Oral Daily  . enoxaparin (LOVENOX) injection  40 mg Subcutaneous Q24H  . feeding supplement (PRO-STAT SUGAR FREE 64)  30 mL Oral TID BM  . insulin aspart  0-15 Units Subcutaneous TID WC  . insulin aspart  0-5 Units Subcutaneous QHS  . insulin aspart  5 Units Subcutaneous TID WC  . insulin glargine  13 Units Subcutaneous Daily  . multivitamin with minerals  1 tablet Oral Daily   Continuous Infusions: . meropenem (MERREM) IV 1 g (11/17/17 1432)  . vancomycin 1,000 mg (11/17/17 0615)     LOS: 13 days   Marylu Lund, MD [facility] Pager [phone]  If 7PM-7AM, please contact night-coverage [email or web address] Password [facility] 11/17/2017, 2:59 PM

## 2017-11-17 NOTE — Progress Notes (Signed)
Peripherally Inserted Central Catheter/Midline Placement  The IV Nurse has discussed with the patient and/or persons authorized to consent for the patient, the purpose of this procedure and the potential benefits and risks involved with this procedure.  The benefits include less needle sticks, lab draws from the catheter, and the patient may be discharged home with the catheter. Risks include, but not limited to, infection, bleeding, blood clot (thrombus formation), and puncture of an artery; nerve damage and irregular heartbeat and possibility to perform a PICC exchange if needed/ordered by physician.  Alternatives to this procedure were also discussed.  Bard Power PICC patient education guide, fact sheet on infection prevention and patient information card has been provided to patient /or left at bedside.    PICC/Midline Placement Documentation        Paul [name] 11/17/2017, 5:49 PM

## 2017-11-17 NOTE — Progress Notes (Addendum)
Daily Rounding Note  11/17/2017, 3:43 PM  LOS: 13 days   SUBJECTIVE:   2 loose stools in last 24 hours.  Tolerating solid diet     No abd pain.  Feels well.  No fevers. 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    OBJECTIVE:         Vital signs in last 24 hours:    Temp:  [98.1 F (36.7 C)-100.3 F (37.9 C)] 98.4 F (36.9 C) (08/20 1212) Pulse Rate:  [88-102] 100 (08/20 1212) Resp:  [17-20] 18 (08/20 1212) BP: (128-144)/(93-99) 131/99 (08/20 1212) SpO2:  [96 %-98 %] 97 % (08/20 1212) Weight:  [84.3 kg] 84.3 kg (08/20 0500) Last BM Date: 11/16/17 Filed Weights   11/11/17 0349 11/16/17 0500 11/17/17 0500  Weight: 88.4 kg 85 kg 84.3 kg   General: looks comfortable and well.     Heart: RRR Chest: clear bil.   Abdomen: soft, active BS.  NT  Extremities: no CCE Neuro/Psych:  Oriented x 3.  No deficits.  Normal affect.    Intake/Output from previous day: No intake/output data recorded.  Intake/Output this shift: Total I/O In: 2059.8 [IV Piggyback:2059.8] Out: -   Lab Results: Recent Labs    11/15/17 0536 11/16/17 0524 11/17/17 0803  WBC 14.0* 13.8* 12.5*  HGB 9.5* 9.4* 10.5*  HCT 29.7* 29.0* 32.3*  PLT 462* 670* 736*   BMET Recent Labs    11/15/17 0536 11/16/17 0524 11/17/17 0803  NA 133* 133* 130*  K 4.3 3.9 3.9  CL 100 98 99  CO2 23 24 23   GLUCOSE 236* 215* 221*  BUN 11 13 13   CREATININE 1.00 0.95 1.01  CALCIUM 8.2* 8.2* 8.4*   LFT Recent Labs    11/15/17 0536 11/16/17 0524 11/17/17 0803  PROT 6.6 6.8 6.8  ALBUMIN 2.4* 2.5* 2.7*  AST 50* 62* 50*  ALT 63* 71* 72*  ALKPHOS 145* 137* 138*  BILITOT 1.5* 1.5* 1.4*   PT/INR Recent Labs    11/17/17 0803  LABPROT 13.8  INR 1.07   Hepatitis Panel No results for input(s): HEPBSAG, HCVAB, HEPAIGM, HEPBIGM in the last 72 hours.  Studies/Results: Ct Abdomen Pelvis W Contrast  Result Date: 11/16/2017 CLINICAL DATA:  Inpatient. Severe  necrotizing pancreatitis. Fever. SIRS. Leukocytosis. EXAM: CT ABDOMEN AND PELVIS WITH CONTRAST TECHNIQUE: Multidetector CT imaging of the abdomen and pelvis was performed using the standard protocol following bolus administration of intravenous contrast. CONTRAST:  155mL ISOVUE-300 IOPAMIDOL (ISOVUE-300) INJECTION 61% COMPARISON:  11/11/2017 CT abdomen/pelvis. FINDINGS: Lower chest: Small dependent left pleural effusion, stable. Mild-to-moderate left and mild right lung base atelectasis. Hepatobiliary: Normal liver size. No liver mass. Normal gallbladder with no radiopaque cholelithiasis. No biliary ductal dilatation. Pancreas: There is diffuse thickening of the pancreas. There is non enhancement of most of the pancreatic parenchyma, compatible with severe necrotizing pancreatitis, not appreciably changed. A few scattered small irregular foci of soft tissue attenuation within the pancreas (series 3/image 45 in the pancreatic head, image 33 in the pancreatic body and image 38 in the pancreatic tail) are unchanged and probably represent preserved regions of parenchymal enhancement. No pancreatic duct dilation. Lesser sac 9.0 x 4.7 cm fluid collection with increased wall thickening and enhancement (series 3/image 28) is more discretely defined and slightly decreased in size from 10.1 x 5.5 cm. Spleen: Normal size. No mass. Adrenals/Urinary Tract: No discrete adrenal nodules. Status post left nephrectomy. No mass or fluid collection in the left  nephrectomy bed. Compensatory hypertrophy of the right kidney. No right hydronephrosis. No right renal mass. Normal bladder. Stomach/Bowel: Normal non-distended stomach. Normal caliber small bowel with no small bowel wall thickening. Normal appendix. Mild wall thickening in the right colon and splenic flexure of the colon, unchanged, probably reactive. Vascular/Lymphatic: Normal caliber abdominal aorta. Patent hepatic, portal, splenic and right renal veins. No pathologically  enlarged lymph nodes in the abdomen or pelvis. Reproductive: Normal size prostate. Other: No pneumoperitoneum. Ill-defined fat stranding and fluid throughout the bilateral anterior paranephric retroperitoneal spaces, right greater than left, slightly decreased. Stable tiny fat containing periumbilical hernia. Musculoskeletal: No aggressive appearing focal osseous lesions. IMPRESSION: 1. Severe diffuse necrotizing pancreatitis without significant interval change. Lesser sac peripancreatic fluid collection is slightly decreased in size but with increased wall thickening. Right greater than left retroperitoneal inflammatory changes are slightly decreased. 2. Small dependent left pleural effusion, stable. 3. Mild wall thickening in the right colon and splenic flexure of the colon, stable, probably reactive. Electronically Signed   By: Ilona Sorrel M.D.   On: 11/16/2017 17:18   [place] Ekg Site Rite  Result Date: 11/17/2017 If Site Rite image not attached, placement could not be confirmed due to current cardiac rhythm.  Scheduled Meds: . amLODipine  2.5 mg Oral QHS  . atenolol  50 mg Oral Daily  . enoxaparin (LOVENOX) injection  40 mg Subcutaneous Q24H  . feeding supplement (PRO-STAT SUGAR FREE 64)  30 mL Oral TID BM  . insulin aspart  0-15 Units Subcutaneous TID WC  . insulin aspart  0-5 Units Subcutaneous QHS  . insulin aspart  5 Units Subcutaneous TID WC  . insulin glargine  13 Units Subcutaneous Daily  . multivitamin with minerals  1 tablet Oral Daily   Continuous Infusions: . meropenem (MERREM) IV Stopped (11/17/17 1509)  . vancomycin 200 mL/hr at 11/17/17 1535   PRN Meds:.acetaminophen **OR** acetaminophen, ALPRAZolam, diphenhydrAMINE-zinc acetate, gi cocktail, hydrALAZINE, HYDROcodone-acetaminophen, HYDROmorphone (DILAUDID) injection, hydrOXYzine, ibuprofen, labetalol, ondansetron **OR** ondansetron (ZOFRAN) IV   ASSESMENT:   *   Acute severe pancreatitis, CT #2 last week concerning for  necrosis.  Etiology fenofibrate versus gallbladder sludge/cholelithiasis. CT abdomen, #3, 8/19: No significant change in diffuse, severe, necrotizing pancreatitis.  Slight decrease in the size and increased wall thickening of the fluid collection in the lesser sac  WBCs, LFTs steadily improved but not yet normal.  No fevers.   On Vanco, meropenem.     *  HCAP.  On Vanc and meropenem. Stable left pleural effusion on yesterday's CT.  *   Right colonic and splenic flexure of colon with mild wall thickening, probably reactive to severe pancreatitis.  *   Normocytic anemia, improved.  *   Hyponatremia.   PLAN   *  Plan on at least  3 full, total weeks of abx Work on organizing home abx infusion with carbopenem is in progress.   May be able to go home by end of week.     Azucena Freed  11/17/2017, 3:43 PM Phone [phone]

## 2017-11-17 NOTE — Progress Notes (Signed)
[facility] will provide [facility] infusion Pharmacy services for pt at DC to home to support IV ABX.  [facility] Infusion Coordinator will provide teaching in the hospital to support independence at home with IV ABX.  If patient discharges after hours, please call [phone].   Larry Sierras 11/17/2017, 4:18 PM

## 2017-11-18 ENCOUNTER — Telehealth: Payer: Self-pay

## 2017-11-18 DIAGNOSIS — N179 Acute kidney failure, unspecified: Secondary | ICD-10-CM

## 2017-11-18 DIAGNOSIS — R739 Hyperglycemia, unspecified: Secondary | ICD-10-CM

## 2017-11-18 DIAGNOSIS — R7989 Other specified abnormal findings of blood chemistry: Secondary | ICD-10-CM

## 2017-11-18 DIAGNOSIS — R945 Abnormal results of liver function studies: Secondary | ICD-10-CM

## 2017-11-18 DIAGNOSIS — K8500 Idiopathic acute pancreatitis without necrosis or infection: Secondary | ICD-10-CM

## 2017-11-18 DIAGNOSIS — K859 Acute pancreatitis without necrosis or infection, unspecified: Principal | ICD-10-CM

## 2017-11-18 LAB — CBC
HCT: 32.3 % — ABNORMAL LOW (ref 39.0–52.0)
HEMOGLOBIN: 10.5 g/dL — AB (ref 13.0–17.0)
MCH: 28.5 pg (ref 26.0–34.0)
MCHC: 32.5 g/dL (ref 30.0–36.0)
MCV: 87.8 fL (ref 78.0–100.0)
PLATELETS: 638 10*3/uL — AB (ref 150–400)
RBC: 3.68 MIL/uL — ABNORMAL LOW (ref 4.22–5.81)
RDW: 14.3 % (ref 11.5–15.5)
WBC: 12.1 10*3/uL — ABNORMAL HIGH (ref 4.0–10.5)

## 2017-11-18 LAB — GLUCOSE, CAPILLARY
GLUCOSE-CAPILLARY: 184 mg/dL — AB (ref 70–99)
GLUCOSE-CAPILLARY: 179 mg/dL — AB (ref 70–99)
Glucose-Capillary: 230 mg/dL — ABNORMAL HIGH (ref 70–99)

## 2017-11-18 LAB — COMPREHENSIVE METABOLIC PANEL
ALBUMIN: 2.7 g/dL — AB (ref 3.5–5.0)
ALK PHOS: 124 U/L (ref 38–126)
ALT: 71 U/L — ABNORMAL HIGH (ref 0–44)
AST: 52 U/L — AB (ref 15–41)
Anion gap: 10 (ref 5–15)
BUN: 16 mg/dL (ref 6–20)
CALCIUM: 8.6 mg/dL — AB (ref 8.9–10.3)
CO2: 23 mmol/L (ref 22–32)
CREATININE: 0.92 mg/dL (ref 0.61–1.24)
Chloride: 102 mmol/L (ref 98–111)
GFR calc Af Amer: >60 mL/min (ref >60)
GFR calc non Af Amer: >60 mL/min (ref >60)
GLUCOSE: 190 mg/dL — AB (ref 70–99)
Potassium: 4.2 mmol/L (ref 3.5–5.1)
SODIUM: 135 mmol/L (ref 135–145)
Total Bilirubin: 1.2 mg/dL (ref 0.3–1.2)
Total Protein: 7.1 g/dL (ref 6.5–8.1)

## 2017-11-18 MED ORDER — PRO-STAT SUGAR FREE PO LIQD: 30.0000 mL | Freq: Three times a day (TID) | ORAL | 0 refills | Status: DC

## 2017-11-18 MED ORDER — INSULIN GLARGINE 100 UNITS/ML SOLOSTAR PEN: 15.0000 [IU] | PEN_INJECTOR | Freq: Every day | SUBCUTANEOUS | 0 refills | Status: DC

## 2017-11-18 MED ORDER — BLOOD GLUCOSE METER KIT: PACK | 0 refills | Status: AC

## 2017-11-18 MED ORDER — ERTAPENEM IV (FOR PTA / DISCHARGE USE ONLY): 1.0000 g | INTRAVENOUS | 0 refills | Status: AC

## 2017-11-18 MED ORDER — INSULIN ASPART 100 UNIT/ML FLEXPEN: PEN_INJECTOR | SUBCUTANEOUS | 0 refills | Status: AC

## 2017-11-18 MED ORDER — INSULIN PEN NEEDLE 31G X 5 MM MISC: 0 refills | Status: AC

## 2017-11-18 NOTE — Consult Note (Signed)
Daily Rounding Note  11/18/2017, 12:23 PM  LOS: 14 days   SUBJECTIVE:   Chief complaint:     Feeling well getting ready for discharge  OBJECTIVE:         Vital signs in last 24 hours:    Temp:  [98.1 F (36.7 C)-98.8 F (37.1 C)] 98.5 F (36.9 C) (08/21 0845) Pulse Rate:  [86-96] 96 (08/21 0845) Resp:  [18-20] 20 (08/21 0845) BP: (118-131)/(83-93) 118/84 (08/21 0845) SpO2:  [97 %-100 %] 98 % (08/21 0845) Last BM Date: 11/16/17 Filed Weights   11/11/17 0349 11/16/17 0500 11/17/17 0500  Weight: 88.4 kg 85 kg 84.3 kg   General: looks fine   Not reexamined but spoke with pt.   No labored breathing.  comfortable  Intake/Output from previous day: 08/20 0701 - 08/21 0700 In: 2509.8 [P.O.:450; IV Piggyback:2059.8] Out: 3 [Urine:3]  Intake/Output this shift: Total I/O In: 120 [P.O.:120] Out: -   Lab Results: Recent Labs    11/16/17 0524 11/17/17 0803 11/18/17 0617  WBC 13.8* 12.5* 12.1*  HGB 9.4* 10.5* 10.5*  HCT 29.0* 32.3* 32.3*  PLT 670* 736* 638*   BMET Recent Labs    11/16/17 0524 11/17/17 0803 11/18/17 0617  NA 133* 130* 135  K 3.9 3.9 4.2  CL 98 99 102  CO2 24 23 23   GLUCOSE 215* 221* 190*  BUN 13 13 16   CREATININE 0.95 1.01 0.92  CALCIUM 8.2* 8.4* 8.6*   LFT Recent Labs    11/16/17 0524 11/17/17 0803 11/18/17 0617  PROT 6.8 6.8 7.1  ALBUMIN 2.5* 2.7* 2.7*  AST 62* 50* 52*  ALT 71* 72* 71*  ALKPHOS 137* 138* 124  BILITOT 1.5* 1.4* 1.2   PT/INR Recent Labs    11/17/17 0803  LABPROT 13.8  INR 1.07   Hepatitis Panel No results for input(s): HEPBSAG, HCVAB, HEPAIGM, HEPBIGM in the last 72 hours.  Studies/Results: Ct Abdomen Pelvis W Contrast  Result Date: 11/16/2017 CLINICAL DATA:  Inpatient. Severe necrotizing pancreatitis. Fever. SIRS. Leukocytosis. EXAM: CT ABDOMEN AND PELVIS WITH CONTRAST TECHNIQUE: Multidetector CT imaging of the abdomen and pelvis was performed using  the standard protocol following bolus administration of intravenous contrast. CONTRAST:  114mL ISOVUE-300 IOPAMIDOL (ISOVUE-300) INJECTION 61% COMPARISON:  11/11/2017 CT abdomen/pelvis. FINDINGS: Lower chest: Small dependent left pleural effusion, stable. Mild-to-moderate left and mild right lung base atelectasis. Hepatobiliary: Normal liver size. No liver mass. Normal gallbladder with no radiopaque cholelithiasis. No biliary ductal dilatation. Pancreas: There is diffuse thickening of the pancreas. There is non enhancement of most of the pancreatic parenchyma, compatible with severe necrotizing pancreatitis, not appreciably changed. A few scattered small irregular foci of soft tissue attenuation within the pancreas (series 3/image 45 in the pancreatic head, image 33 in the pancreatic body and image 38 in the pancreatic tail) are unchanged and probably represent preserved regions of parenchymal enhancement. No pancreatic duct dilation. Lesser sac 9.0 x 4.7 cm fluid collection with increased wall thickening and enhancement (series 3/image 28) is more discretely defined and slightly decreased in size from 10.1 x 5.5 cm. Spleen: Normal size. No mass. Adrenals/Urinary Tract: No discrete adrenal nodules. Status post left nephrectomy. No mass or fluid collection in the left nephrectomy bed. Compensatory hypertrophy of the right kidney. No right hydronephrosis. No right renal mass. Normal bladder. Stomach/Bowel: Normal non-distended stomach. Normal caliber small bowel with no small bowel wall thickening. Normal appendix. Mild wall thickening in the right colon and splenic  flexure of the colon, unchanged, probably reactive. Vascular/Lymphatic: Normal caliber abdominal aorta. Patent hepatic, portal, splenic and right renal veins. No pathologically enlarged lymph nodes in the abdomen or pelvis. Reproductive: Normal size prostate. Other: No pneumoperitoneum. Ill-defined fat stranding and fluid throughout the bilateral anterior  paranephric retroperitoneal spaces, right greater than left, slightly decreased. Stable tiny fat containing periumbilical hernia. Musculoskeletal: No aggressive appearing focal osseous lesions. IMPRESSION: 1. Severe diffuse necrotizing pancreatitis without significant interval change. Lesser sac peripancreatic fluid collection is slightly decreased in size but with increased wall thickening. Right greater than left retroperitoneal inflammatory changes are slightly decreased. 2. Small dependent left pleural effusion, stable. 3. Mild wall thickening in the right colon and splenic flexure of the colon, stable, probably reactive. Electronically Signed   By: Ilona Sorrel M.D.   On: 11/16/2017 17:18   [place] Ekg Site Rite  Result Date: 11/17/2017 If Site Rite image not attached, placement could not be confirmed due to current cardiac rhythm.   ASSESMENT:   *   Acute, necrotizing pancreatitis.  Clinically much improved.  Ready for discharge. Day 9/21 of Meropenem.    *  ? HCAP.  On Vanc.  Pleural effusion left on CT 2 days ago.  PLAN   *  Transition to Ertrapenem at discharge.  Should complete a total of 3 weeks so he needs 12 more days of antibiotics.    *    GI office will contact patient with follow-up appt in about 3 weeks.  We will also plan for outpatient CT scan a day or so before that appointment with Dr. Rush Landmark.      Azucena Freed  11/18/2017, 12:23 PM Phone [phone]

## 2017-11-18 NOTE — Progress Notes (Signed)
Patient discharged home with wife providing transportation. VS stable at discharge. No questions noted.

## 2017-11-18 NOTE — Telephone Encounter (Signed)
-----   Message from Vena Rua, PA-C sent at 11/18/2017 12:24 PM EDT ----- Regarding: Arrange follow-up in office and outpatient follow-up CT Hi Taliyah Watrous.  This patient needs follow-up with Dr. Jerilynn Mages.  Appointment should be in about 2 to 3 weeks.  Dr. Jerilynn Mages wants him to undergo CT abdomen/pelvis with pancreatic protocol to reevaluate the pancreatitis a day or 2 prior to the appointment.  I know I can make the appointment but arranging a CT is your expertise so I am deferring all the arrangements to you.  Thanks very much for your help and have a great day. [name].

## 2017-11-18 NOTE — Progress Notes (Signed)
Inpatient Diabetes Program Recommendations  AACE/ADA: New Consensus Statement on Inpatient Glycemic Control (2015)  Target Ranges:  Prepandial:   less than 140 mg/dL      Peak postprandial:   less than 180 mg/dL (1-2 hours)      Critically ill patients:  140 - 180 mg/dL   Lab Results  Component Value Date   GLUCAP 230 (H) 11/18/2017   HGBA1C 5.5 11/04/2017    Review of Glycemic Control Results for REI, MEDLEN (MRN [id number]) as of 11/18/2017 13:55  Ref. Range 11/17/2017 16:59 11/17/2017 22:09 11/18/2017 06:48 11/18/2017 11:33  Glucose-Capillary Latest Ref Range: 70 - 99 mg/dL 239 (H) 116 (H) 184 (H) 230 (H)   Diabetes history: none Outpatient Diabetes medications: none Current orders for Inpatient glycemic control: Lantus 13 units QD, Novolog 5 units TID, Novolog 0-15 units TID, Novolog 0-5 units QHS  Inpatient Diabetes Program Recommendations:   Spoke with patient following consult and new to insulin.   Reviewed patient's current A1c of 5.5%. Explained what a A1c is and what it measures. Also reviewed goal A1c with patient, importance of good glucose control @ home, and blood sugar goals. Explained patho of DM, especially in acute pancreatitis, role of pancreas, need for additional insulin, reviewed normal blood glucose levels, and differentiated between short acting insulin vs. long acting insulin.   Patient will need a meter at discharge. Blood glucose meter kit (includes lancets and strips) ([id number]). Encouraged to check blood sugars 3-4 times per day and expect needs to change over time. Patient plans to follow up with PCP in 1-2 weeks and informed to take meter with him. Encouraged communication with provider if having multiple episodes of hypoglycemia '70mg'$ /dL or if blood sugars are continuing to exceed 200 mg/dL. Discussed in depth survival skills, interventions and sick day rules. Patient able to verbalize interventions.  Educated patient on insulin pen use at home. Reviewed  contents of insulin flexpen starter kit. Reviewed all steps if insulin pen including attachment of needle, 2-unit air shot, dialing up dose, giving injection, removing needle, disposal of sharps, storage of unused insulin, disposal of insulin etc. Patient able to provide successful return demonstration. Also reviewed troubleshooting with insulin pen. MD to give patient Rxs for insulin pens and insulin pen needles [phone]).  Provided patient with injection schedule to include Lantus 15 units QHS and Novolog SSI TID beginning at 140 mg/dL, as discussed with Dr Erlinda Hong. Patient expresses understanding and appears confident with schedule. Has no further questions.  Thanks, Bronson Curb, MSN, RNC-OB Diabetes Coordinator [phone] (8a-5p)

## 2017-11-18 NOTE — Telephone Encounter (Signed)
Follow up appt with Dr Rush Landmark on 12/16/17 at 1030 am

## 2017-11-18 NOTE — Progress Notes (Signed)
Pharmacy Antibiotic Note  Paul [name] is a 37 y.o. male who continues on antibiotics for necrotic pancreatitis - GI planning 3 weeks. The plan is to continue Meropenem inpatient and transition to Ertapenem at discharge.   Renal function remains stable, current Meropenem dosing is appropriate.   Plan: - Continue Meropenem 1g IV every 8 hours - To transition to Ertapenem at discharge, OPAT completed 8/20 - Will continue to follow renal function for appropriate dosing  Height: 5\' 10"  (177.8 cm) Weight: 185 lb 13.6 oz (84.3 kg) IBW/kg (Calculated) : 73  Temp (24hrs), Avg:98.5 F (36.9 C), Min:98.1 F (36.7 C), Max:98.8 F (37.1 C)  Recent Labs  Lab 11/13/17 0414 11/14/17 0556 11/15/17 0536 11/16/17 0524 11/17/17 0803 11/18/17 0617  WBC 19.5* 16.1* 14.0* 13.8* 12.5* 12.1*  CREATININE 0.98 1.01 1.00 0.95 1.01 0.92  LATICACIDVEN 1.1  --   --   --   --   --   VANCOTROUGH  --   --  19  --   --   --     Estimated Creatinine Clearance: 114.6 mL/min (by C-G formula based on SCr of 0.92 mg/dL).    No Known Allergies  Antimicrobials this admission: Meropenem 8/13 >> (9/3, to switch to Erta at d/c) Vanc 8/15 >> 8/20  Microbiology results: 8/15 bld x2 - ngtd 8/18 MRSA PCR >> positive  Thank you for allowing pharmacy to be a part of this patient's care.  Alycia Rossetti, PharmD, BCPS Clinical Pharmacist Pager: [phone] Clinical phone for 11/18/2017 from 7a-3:30p: [phone] If after 3:30p, please call main pharmacy at: [phone] Please check AMION for all [facility] numbers 11/18/2017 11:52 AM

## 2017-11-18 NOTE — Discharge Summary (Signed)
Discharge Summary  Paul [name] YWV:[id number] DOB: [birth year]  PCP: Karleen Hampshire., MD  Admit date: 11/03/2017 Discharge date: 11/18/2017  Time spent: 43mns, more than 50% time spent on coordination of care  Recommendations for Outpatient Follow-up:  1. F/u with PMD within a week  for hospital discharge follow up, repeat cbc/cmp/lipase at follow up. pcp to monitor blood glucose management, pcp to repeat cxr in 3-4 weeks to ensure resolution of left lower lobe infiltrate.  2. F/u with GI 3. Home health RN for home iv abx and blood glucose monitoring ( he is newly started on insulin)  Discharge Diagnoses:  Active Hospital Problems   Diagnosis Date Noted  . Acute pancreatitis 11/04/2017  . Abnormal LFTs   . ARF (acute renal failure) ([place] 11/04/2017  . Essential hypertension 11/04/2017    Resolved Hospital Problems  No resolved problems to display.    Discharge Condition: stable  Diet recommendation: heart healthy/carb modified  Filed Weights   11/11/17 0349 11/16/17 0500 11/17/17 0500  Weight: 88.4 kg 85 kg 84.3 kg    History of present illness: (per admitting MD Dr KHal Hope PCP: FKarleen Hampshire, MD  Patient coming from: Home.  Chief Complaint: Abdominal pain.  HPI: Paul Kautzmanis a 37y.o. male with history of hypertension, hypertriglyceridemia, history of renal cell carcinoma status [name] with complaint of abdominal pain since yesterday morning.  Has been a multiple episode of nausea vomiting and some diarrhea.  Pain is mostly epigastric area.  Had last alcoholic drink 2 days ago.  States he only drinks on the weekends.  No recent medication changes.  ED Course: In the ER labs show elevated lipase around 4000 and CT abdomen shows features concerning for acute pancreatitis labs also show leukocytosis with acute renal failure.  Hospital Course:  Principal Problem:   Acute pancreatitis Active  Problems:   ARF (acute renal failure) (HCC)   Essential hypertension   Abnormal LFTs   Acute pancreatitis -Etiology uncertain -he denies heavy EtOH use (although may not be forthcoming), triglycerides within normal range, no gallstones noted on CT, IgG4 within normal limits -RUQ UKoreanoted, no dilated CBD, no stones, GB sludge noted -he has ct ab on 8/7, 8/14 and 8/19, on 8/19 ct showed "Severe diffuse necrotizing pancreatitis without significant interval change. Lesser sac peripancreatic fluid collection is slightly decreased in size but with increased wall thickening. Right greater than left retroperitoneal inflammatory changes are slightly Decreased." -he received iv meropenem since 8/13,  clinical improvement noted as pt tolerating diet without difficulty, he denies ab pain, no n/v , he wants to go home --Liver function improving over time. -lipase on presentation was 4842, has trended down to 67 on 8/14 --GI consulted and recommendations for total 3 weeks of abx on discharge (stop date 9/3) of ertapenem. -OPAT ordered -PICC placed on 8/20 to right arm -HHRN orders placed  Fluid overload/Pleural effusions, possible left lower lobe pneumonia -Due to aggressive fluid resuscitation and third spacing from severe pancreatitis/hypoalbuminemia -diuresed with Lasix from 8/9 to 8/11, -MRSA swab pos, thus would cont vanc for now. Anticipate completing 5 days of vanc. Stop date of vanc through 8/20 -he is on ertapenem -Appears comfortable on room air, breathing comfortably  Hyperglycemia -likely from pancreatic dysfunction from inflammation, hemoglobin A1c was 5.5 -glucose persistently in the 200's -he received lantus and ssi in the hospital, am blood glucose on insulin is 190. -he is discharged  on lantus 15units qhs and ssi, insulin teaching is done -not sure duration of insulin, as hyperglycemia likely from pancreatic dysfunction from inflammation -home health RN informed needs of monitor  blood glucose, patient is advised to follow up with pmd closely in one week -meters /testing strips/lancet/pen needles prescription provided. -inpatient diabetes coordinator consulted, input appreciated, detail please see diabetes coordinator note on 8/21.   Acute kidney injury -Baseline creatinine is 1.1, this admission has been in 1.5 range, likely ATN from above -renal function has normalized and remains stable -Remains stable and improved  Hypertension/rebound tachycardia --initial sinus tachycardia likely due to acute illness, improved - continue home meds atenolol and norvasc   History of renal cell carcinoma,  -Patient is status post left-sided [name] in 2012 - Patient to follow up with Urology -patient voiding      DVT prophylaxis while in the hospital: Lovenox subQ Code Status: Full Family Communication: Pt in room, family at bedside Disposition Plan: home with home health  Consultants:   GI  Procedures:   picc line placement   Discharge Exam: BP 120/89 (BP Location: Left Arm)   Pulse 82   Temp 98.5 F (36.9 C) (Oral)   Resp 20   Ht _0  (1.778 m)   Wt 84.3 kg   SpO2 99%   BMI 26.67 kg/m   General: NAD, picc line to right arm Cardiovascular: RRR Respiratory: CTABL Ab: soft, nontender, +bs  Discharge Instructions You were cared for by a hospitalist during your hospital stay. If you have any questions about your discharge medications or the care you received while you were in the hospital after you are discharged, you can call the unit and asked to speak with the hospitalist on call if the hospitalist that took care of you is not available. Once you are discharged, your primary care physician will handle any further medical issues. Please note that NO REFILLS for any discharge medications will be authorized once you are discharged, as it is imperative that you return to your primary care physician (or establish a relationship with a  primary care physician if you do not have one) for your aftercare needs so that they can reassess your need for medications and monitor your lab values.  Discharge Instructions    Diet general   Complete by:  As directed    Low fat diet, avoid alcohol   Home infusion instructions [facility] May follow [place] Dosing Protocol; May administer Cathflo as needed to maintain patency of vascular access device.; [place]ushing of vascular access device: per [facility] Protocol: 0.9% NaCl pre/post medica...   Complete by:  As directed    Instructions:  May follow [place] Dosing Protocol   Instructions:  May administer Cathflo as needed to maintain patency of vascular access device.   Instructions:  Flushing of vascular access device: per [facility] Protocol: 0.9% NaCl pre/post medication administration and prn patency; Heparin 100 u/ml, 93m for implanted ports and Heparin 10u/ml, 544mfor all other central venous catheters.   Instructions:  May follow [facility] Anaphylaxis Protocol for First Dose Administration in the home: 0.9% NaCl at 25-50 ml/hr to maintain IV access for protocol meds. Epinephrine 0.3 ml IV/IM PRN and Benadryl 25-50 IV/IM PRN s/s of anaphylaxis.   Instructions:  [facility]fusion Coordinator (RN) to assist per patient IV care needs in the home PRN.   Increase activity slowly   Complete by:  As directed      Allergies as of 11/18/2017  No Known Allergies     Medication List    TAKE these medications   allopurinol 100 MG tablet Commonly known as:  ZYLOPRIM Take 100 mg by mouth 2 (two) times daily.   amLODipine 2.5 MG tablet Commonly known as:  NORVASC Take 2.5 mg by mouth at bedtime.   atenolol 50 MG tablet Commonly known as:  TENORMIN Take 50 mg by mouth daily.   blood glucose meter kit and supplies Dispense based on patient and insurance preference. Use up to four times daily as directed. (FOR ICD-10 E10.9, E11.9).   ertapenem  IVPB Commonly known as:   INVANZ Inject 1 g into the vein daily for 14 days. Indication:  Necrotizing pancreatitis Last Day of Therapy:  12/01/17 Labs - Once weekly:  CBC/D and BMP, Labs - Every other week:  ESR and CRP   feeding supplement (PRO-STAT SUGAR FREE 64) Liqd Take 30 mLs by mouth 3 (three) times daily between meals.   fenofibrate 160 MG tablet Take 160 mg by mouth daily.   insulin aspart 100 UNIT/ML FlexPen Commonly known as:  NOVOLOG Before each meal 3 times a day, 140-199 - 2 units, 200-250 - 4 units, 251-299 - 6 units,  300-349 - 8 units,  350 or above 10 units. Insulin PEN if approved, provide syringes and needles if needed.   insulin glargine 100 unit/mL Sopn Commonly known as:  LANTUS Inject 0.15 mLs (15 Units total) into the skin at bedtime.   Insulin Pen Needle 31G X 5 MM Misc For insulin injection, please provide a month supply.   RA KRILL OIL 500 MG Caps Take 500 mg by mouth daily.   vitamin B-12 1000 MCG tablet Commonly known as:  CYANOCOBALAMIN Take 1,000 mcg by mouth daily.            Home Infusion Instuctions  (From admission, onward)         Start     Ordered   11/18/17 0000  Home infusion instructions [facility] May follow [place] Dosing Protocol; May administer Cathflo as needed to maintain patency of vascular access device.; Flushing of vascular access device: per [facility] Protocol: 0.9% NaCl pre/post medica...    Question Answer Comment  Instructions May follow [place] Dosing Protocol   Instructions May administer Cathflo as needed to maintain patency of vascular access device.   Instructions Flushing of vascular access device: per [facility] Protocol: 0.9% NaCl pre/post medication administration and prn patency; Heparin 100 u/ml, 27m for implanted ports and Heparin 10u/ml, 537mfor all other central venous catheters.   Instructions May follow [facility] Anaphylaxis Protocol for First Dose Administration in the home: 0.9% NaCl at 25-50 ml/hr to maintain IV access for  protocol meds. Epinephrine 0.3 ml IV/IM PRN and Benadryl 25-50 IV/IM PRN s/s of anaphylaxis.   Instructions [facility] Infusion Coordinator (RN) to assist per patient IV care needs in the home PRN.      11/18/17 0952         No Known Allergies Follow-up Information    Ad[facility] up.   Why:  they will contact you for the first visit. Contact information: [phone]        Mansouraty, GaTelford Nab MD Follow up.   Specialties:  Gastroenterology, Internal Medicine Contact information: [phone]        FuKarleen Hampshire MD Follow up in 1 week(s).  Specialty:  Internal Medicine Why:  hospital discharge follow up. repeat cbc/cmp/lipase at follow up pcp to monitor blood glucose control and decide on duration of insulin treatment pcp to repeat cxr in 3-4 weeks to follow up on left lower lobe infiltrate  Contact information: [place] [address] [place] [address] [phone]            The results of significant diagnostics from this hospitalization (including imaging, microbiology, ancillary and laboratory) are listed below for reference.    Significant Diagnostic Studies: Dg Chest 2 View  Result Date: 11/07/2017 CLINICAL DATA:  Hypoxia, shortness of breath and pancreatitis. EXAM: CHEST - 2 VIEW COMPARISON:  Right rib films on 09/05/2016 FINDINGS: The heart size is normal. Lung volumes are relatively low with bibasilar atelectasis present and small posterior pleural effusions, slightly greater on the left. No pulmonary edema or pneumothorax. No nodules detected. Bony thorax is unremarkable. IMPRESSION: Low lung volumes with small bilateral posterior pleural effusions, slightly greater on the left. Associated bibasilar atelectasis. Electronically Signed   By: Aletta Edouard M.D.   On: 11/07/2017 10:13   Ct Abdomen Pelvis W Contrast  Result Date:  11/16/2017 CLINICAL DATA:  Inpatient. Severe necrotizing pancreatitis. Fever. SIRS. Leukocytosis. EXAM: CT ABDOMEN AND PELVIS WITH CONTRAST TECHNIQUE: Multidetector CT imaging of the abdomen and pelvis was performed using the standard protocol following bolus administration of intravenous contrast. CONTRAST:  174m ISOVUE-300 IOPAMIDOL (ISOVUE-300) INJECTION 61% COMPARISON:  11/11/2017 CT abdomen/pelvis. FINDINGS: Lower chest: Small dependent left pleural effusion, stable. Mild-to-moderate left and mild right lung base atelectasis. Hepatobiliary: Normal liver size. No liver mass. Normal gallbladder with no radiopaque cholelithiasis. No biliary ductal dilatation. Pancreas: There is diffuse thickening of the pancreas. There is non enhancement of most of the pancreatic parenchyma, compatible with severe necrotizing pancreatitis, not appreciably changed. A few scattered small irregular foci of soft tissue attenuation within the pancreas (series 3/image 45 in the pancreatic head, image 33 in the pancreatic body and image 38 in the pancreatic tail) are unchanged and probably represent preserved regions of parenchymal enhancement. No pancreatic duct dilation. Lesser sac 9.0 x 4.7 cm fluid collection with increased wall thickening and enhancement (series 3/image 28) is more discretely defined and slightly decreased in size from 10.1 x 5.5 cm. Spleen: Normal size. No mass. Adrenals/Urinary Tract: No discrete adrenal nodules. Status post left [name]. No mass or fluid collection in the left [name] bed. Compensatory hypertrophy of the right kidney. No right hydronephrosis. No right renal mass. Normal bladder. Stomach/Bowel: Normal non-distended stomach. Normal caliber small bowel with no small bowel wall thickening. Normal appendix. Mild wall thickening in the right colon and splenic flexure of the colon, unchanged, probably reactive. Vascular/Lymphatic: Normal caliber abdominal aorta. Patent hepatic, portal, splenic  and right renal veins. No pathologically enlarged lymph nodes in the abdomen or pelvis. Reproductive: Normal size prostate. Other: No pneumoperitoneum. Ill-defined fat stranding and fluid throughout the bilateral anterior paranephric retroperitoneal spaces, right greater than left, slightly decreased. Stable tiny fat containing periumbilical hernia. Musculoskeletal: No aggressive appearing focal osseous lesions. IMPRESSION: 1. Severe diffuse necrotizing pancreatitis without significant interval change. Lesser sac peripancreatic fluid collection is slightly decreased in size but with increased wall thickening. Right greater than left retroperitoneal inflammatory changes are slightly decreased. 2. Small dependent left pleural effusion, stable. 3. Mild wall thickening in the right colon and splenic flexure of the colon, stable, probably reactive. Electronically Signed   By: JIlona SorrelM.D.   On: 11/16/2017 17:18  Ct Abdomen Pelvis W Contrast  Result Date: 11/11/2017 CLINICAL DATA:  Pancreatitis. EXAM: CT ABDOMEN AND PELVIS WITH CONTRAST TECHNIQUE: Multidetector CT imaging of the abdomen and pelvis was performed using the standard protocol following bolus administration of intravenous contrast. CONTRAST:  69m ISOVUE-300 IOPAMIDOL (ISOVUE-300) INJECTION 61%, <See Chart> ISOVUE-300 IOPAMIDOL (ISOVUE-300) INJECTION 61% COMPARISON:  11/04/2017 FINDINGS: Lower chest: Small LEFT effusion and bibasilar atelectasis. Pleural effusions new from prior Hepatobiliary: No focal hepatic lesion. Gallbladder normal. No biliary duct dilatation. Pancreas: Severe edema of the pancreas. The pancreatic parenchyma is poorly defined with out normal enhancement. The degree pancreatic tissue definition is decreased in interval. Findings are concerning for necrotizing pancreatitis. A scant normal in enhancing pancreatic tissue is noted the head (image 43/3 in the midbody a small portion the tail. Pancreatic duct is not imaged. There is a  beginnings organized fluid collection positioned between the greater curvature the stomach and the pancreas with a thin enhancing rim (image 47/6. Fluid extends along the LEFT para RIGHT pericolic gutter and pelvis without organization. There is no clear vascular complication associated pancreatitis. Portal veins are patent. Splenic vein is small but patent. Spleen: Normal spleen Adrenals/urinary tract: Adrenal glands normal. RIGHT kidney normal. Post LEFT [name]. Stomach/Bowel: Stomach, and duodenum, small-bowel appendix and cecum normal. Colon and rectosigmoid colon normal. There is some bowel wall edema involving the splenic flexure of the colon related to pancreatitis per Vascular/Lymphatic: Abdominal aorta is normal caliber. There is no retroperitoneal or periportal lymphadenopathy. No pelvic lymphadenopathy. Reproductive: Prostate normal Other: No free fluid. Musculoskeletal: No aggressive osseous lesion. IMPRESSION: 1. 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 2. Increased organization of fluid collection between the pancreas and stomach consists with early pseudocyst formation. 3. New pleural effusion. 4. Persistent fluid extending along pericolic gutters into the pelvis not changed. 5. Local bowel edema of the colon splenic flexure related to the adjacent pancreatitis. These results will be called to the ordering clinician or representative by the Radiologist Assistant, and communication documented in the PACS or zVision Dashboard. Electronically Signed   By: SSuzy BouchardM.D.   On: 11/11/2017 10:34   Ct Abdomen Pelvis W Contrast  Result Date: 11/04/2017 CLINICAL DATA:  Acute onset of severe generalized abdominal pain and nausea. EXAM: CT ABDOMEN AND PELVIS WITH CONTRAST TECHNIQUE: Multidetector CT imaging of the abdomen and pelvis was performed using the standard protocol following bolus administration of  intravenous contrast. CONTRAST:  876mOMNIPAQUE IOHEXOL 300 MG/ML  SOLN COMPARISON:  PET/CT performed 09/15/2016 FINDINGS: Lower chest: Minimal bibasilar atelectasis is noted. The visualized portions of the mediastinum are unremarkable. Hepatobiliary: The liver is unremarkable in appearance. The gallbladder is unremarkable in appearance. The common bile duct remains normal in caliber. Trace ascites is noted about the liver. Pancreas: Diffuse soft tissue inflammation is noted about the entirety of the pancreas, compatible with acute pancreatitis. There is question of mild devascularization about the head and proximal body of the pancreas, difficult to fully characterize. No pseudocyst formation is identified at this time. Associated free fluid tracks about the upper abdomen and along the paracolic gutters bilaterally into the pelvis. Spleen: The spleen is unremarkable in appearance. Adrenals/Urinary Tract: The right adrenal gland is unremarkable in appearance. The patient is status post left-sided adrenalectomy and [name]. The right kidney is unremarkable. There is no evidence of hydronephrosis. No renal or ureteral stones are identified. No perinephric stranding is seen. Stomach/Bowel: The stomach is unremarkable in appearance. The  small bowel is within normal limits. The appendix is normal in caliber, without evidence of appendicitis. The colon is unremarkable in appearance. Vascular/Lymphatic: The abdominal aorta is unremarkable in appearance. The inferior vena cava is grossly unremarkable. No retroperitoneal lymphadenopathy is seen. No pelvic sidewall lymphadenopathy is identified. Reproductive: The bladder is mildly distended and grossly unremarkable. The prostate remains normal in size. Other: No additional soft tissue abnormalities are seen. Musculoskeletal: No acute osseous abnormalities are identified. Scattered bone islands are noted within the proximal humerus bilaterally. The visualized musculature  is unremarkable in appearance. IMPRESSION: 1. Diffuse soft tissue inflammation about the entirety of the pancreas, compatible with acute pancreatitis. There is question of mild devascularization about the head and proximal body of the pancreas, difficult to fully characterize. No pseudocyst formation identified at this time. 2. Associated free fluid tracks about the upper abdomen and along the paracolic gutters bilaterally into the pelvis, with trace free fluid about the liver. Electronically Signed   By: Garald Balding M.D.   On: 11/04/2017 01:30   Dg Chest Port 1 View  Result Date: 11/12/2017 CLINICAL DATA:  Shortness of breath with elevated white blood cell count EXAM: PORTABLE CHEST 1 VIEW COMPARISON:  November 07, 2017 FINDINGS: There is patchy consolidation in the left base with small left pleural effusion. There is mild right base atelectasis. Lungs elsewhere are clear. Heart is borderline enlarged with pulmonary vascularity normal. No adenopathy. No bone lesions. IMPRESSION: Patchy consolidation left base felt to represent focal pneumonia. Small left pleural effusion. Right base atelectasis. Mild cardiac prominence. Electronically Signed   By: Lowella Grip III M.D.   On: 11/12/2017 12:02   [place] Ekg Site Rite  Result Date: 11/17/2017 If Site Rite image not attached, placement could not be confirmed due to current cardiac rhythm.  US Abdomen Limited Ruq  Result Date: 11/10/2017 CLINICAL DATA:  Abnormal liver function tests. EXAM: ULTRASOUND ABDOMEN LIMITED RIGHT UPPER QUADRANT COMPARISON:  CT scan of November 04, 2017. Ultrasound of September 11, 2016. FINDINGS: Gallbladder: No gallstones or wall thickening visualized. No sonographic Murphy sign noted by sonographer. Probable small amount of sludge is noted within the gallbladder lumen. Small amount of pericholecystic fluid is noted. Common bile duct: Diameter: 3.4 mm which is within normal limits. Liver: No focal lesion identified. Increased echogenicity  of hepatic parenchyma is noted with fatty sparing adjacent to gallbladder fossa. Portal vein is patent on color Doppler imaging with normal direction of blood flow towards the liver. IMPRESSION: Small amount of pericholecystic fluid is noted which most likely is related to pancreatitis described on CT scan. Fatty infiltration of the liver is noted with sparing adjacent to gallbladder fossa. Electronically Signed   By: Marijo Conception, M.D.   On: 11/10/2017 09:52    Microbiology: Recent Results (from the past 240 hour(s))  Culture, blood (routine x 2)     Status: None   Collection Time: 11/12/17 10:30 AM  Result Value Ref Range Status   Specimen Description BLOOD RIGHT ANTECUBITAL  Final   Special Requests   Final    BOTTLES DRAWN AEROBIC AND ANAEROBIC Blood Culture adequate volume   Culture   Final    NO GROWTH 5 DAYS Performed at [facility], [address], [place], [place] [address]    Report Status 11/17/2017 FINAL  Final  Culture, blood (routine x 2)     Status: None   Collection Time: 11/12/17 10:35 AM  Result Value Ref Range Status   Specimen Description  BLOOD LEFT ANTECUBITAL  Final   Special Requests   Final    BOTTLES DRAWN AEROBIC AND ANAEROBIC Blood Culture adequate volume   Culture   Final    NO GROWTH 5 DAYS Performed at [facility], [address] [place], [place] [address]    Report Status 11/17/2017 FINAL  Final  MRSA PCR Screening     Status: Abnormal   Collection Time: 11/15/17  8:04 AM  Result Value Ref Range Status   MRSA by PCR POSITIVE (A) NEGATIVE Final    Comment:        The GeneXpert MRSA Assay (FDA approved for NASAL specimens only), is one component of a comprehensive MRSA colonization surveillance program. It is not intended to diagnose MRSA infection nor to guide or monitor treatment for MRSA infections. RESULT CALLED TO, READ BACK BY AND VERIFIED WITH: TJerl Mina RN AT 9147 11/15/17 BY D. VANHOOK      Labs: Basic Metabolic  Panel: Recent Labs  Lab 11/14/17 0556 11/15/17 0536 11/16/17 0524 11/17/17 0803 11/18/17 0617  NA 131* 133* 133* 130* 135  K 3.8 4.3 3.9 3.9 4.2  CL 98 100 98 99 102  CO2 21* _0 GLUCOSE 212* 236* 215* 221* 190*  BUN _1 CREATININE 1.01 1.00 0.95 1.01 0.92  CALCIUM 8.0* 8.2* 8.2* 8.4* 8.6*  MG 2.1  --   --   --   --   PHOS 2.9  --   --   --   --    Liver Function Tests: Recent Labs  Lab 11/14/17 0556 11/15/17 0536 11/16/17 0524 11/17/17 0803 11/18/17 0617  AST 70* 50* 62* 50* 52*  ALT 71* 63* 71* 72* 71*  ALKPHOS 153* 145* 137* 138* 124  BILITOT 1.8* 1.5* 1.5* 1.4* 1.2  PROT 5.9* 6.6 6.8 6.8 7.1  ALBUMIN 2.3* 2.4* 2.5* 2.7* 2.7*   No results for input(s): LIPASE, AMYLASE in the last 168 hours. No results for input(s): AMMONIA in the last 168 hours. CBC: Recent Labs  Lab 11/13/17 0414 11/14/17 0556 11/15/17 0536 11/16/17 0524 11/17/17 0803 11/18/17 0617  WBC 19.5* 16.1* 14.0* 13.8* 12.5* 12.1*  NEUTROABS 16.3*  --   --   --   --   --   HGB 10.2* 9.2* 9.5* 9.4* 10.5* 10.5*  HCT 29.7* 28.0* 29.7* 29.0* 32.3* 32.3*  MCV 84.6 86.4 87.4 88.1 88.0 87.8  PLT 541* 579* 462* 670* 736* 638*   Cardiac Enzymes: No results for input(s): CKTOTAL, CKMB, CKMBINDEX, TROPONINI in the last 168 hours. BNP: BNP (last 3 results) No results for input(s): BNP in the last 8760 hours.  ProBNP (last 3 results) No results for input(s): PROBNP in the last 8760 hours.  CBG: Recent Labs  Lab 11/17/17 1209 11/17/17 1659 11/17/17 2209 11/18/17 0648 11/18/17 1133  GLUCAP 208* 239* 116* 184* 230*       Signed:  Florencia Reasons MD, PhD  [facility] 11/18/2017, 2:45 PM

## 2017-11-19 NOTE — Telephone Encounter (Signed)
Line busy

## 2017-11-19 NOTE — Telephone Encounter (Signed)
Mansouraty, Telford Nab., MD  Clearence Cheek; Timothy Lasso, RN        Thanks [name].  Paul [name], I would like a pancreas protocol CT abdomen or a CT abdomen with contrast.  Ideally, the patient would get some labs drawn prior (CBC, CMP, INR)  Let me know and I can put in orders as necessary.  Thanks.   Chester Holstein

## 2017-11-19 NOTE — Telephone Encounter (Signed)
Line busy   You will also need to have LABS TODAY.  Please head down to our basement lab after leaving our office.    You have been scheduled for a CT scan of the abdomen at [facility] ([address]---this is in the same building as Press [facility]).   You are scheduled on 12/03/17 at 11 am. You should arrive 30 minutes prior to your appointment time for registration. Please follow the written instructions below on the day of your exam:  WARNING: IF YOU ARE ALLERGIC TO IODINE/X-RAY DYE, PLEASE NOTIFY RADIOLOGY IMMEDIATELY AT [phone]! YOU WILL BE GIVEN A 13 HOUR PREMEDICATION PREP.  1) Do not eat or drink anything after 9 am (2 hours prior to your test) 2) You have been given 1 bottle of oral contrast to drink. The solution may taste better if refrigerated, but do NOT add ice or any other liquid to this solution. Shake well before drinking.   Drink 1 bottle of contrast @ 10 am (1 hour prior to your exam)  You may take any medications as prescribed with a small amount of water except for the following: Metformin, Glucophage, Glucovance, Avandamet, Riomet, Fortamet, Actoplus Met, Janumet, Glumetza or Metaglip. The above medications must be held the day of the exam AND 48 hours after the exam.  The purpose of you drinking the oral contrast is to aid in the visualization of your intestinal tract. The contrast solution may cause some diarrhea. Before your exam is started, you will be given a small amount of fluid to drink. Depending on your individual set of symptoms, you may also receive an intravenous injection of x-ray contrast/dye. Plan on being at [facility] for 30 minutes or longer, depending on the type of exam you are having performed.  This test typically takes 30-45 minutes to complete.  If you have any questions regarding your exam or if you need to reschedule, you may call the CT department at [phone] between the hours of 8:00 am and 5:00 pm,  Monday-Friday.  ________________________________________________________________________

## 2017-11-19 NOTE — Telephone Encounter (Signed)
12/16/17 1030 am office appt with Dr Jerilynn Mages

## 2017-11-20 NOTE — Care Management Note (Signed)
Case Management Note  Patient Details  Name: Paul [name] MRN: [id number] Date of Birth: 04/21/1980  Subjective/Objective:                    Action/Plan: Pt discharging home with IV antibiotics. CM had provided him choice of HH agencies yesterday and he selected [facility]. Pam with IV therapy with [facility] notified and accepted the referral. Pam educated patient and wife on home IV administration.  Pt has transportation home, hospital f/u and [facility] will deliver his antibiotics to his home.    Expected Discharge Date:  11/18/17               Expected Discharge Plan:  [place]  In-House Referral:     Discharge planning Services  CM Consult  Post Acute Care Choice:  Home Health Choice offered to:  Patient  DME Arranged:    DME Agency:     HH Arranged:  RN [place] Agency:  [facility]  Status of Service:  Completed, signed off  If discussed at [place] of Stay Meetings, dates discussed:    Additional Comments:  Pollie Friar, RN 11/20/2017, 8:52 AM

## 2017-11-20 NOTE — Telephone Encounter (Signed)
The pt has been advised and will pick up contrast and instructions as well as have labs.

## 2017-11-24 ENCOUNTER — Other Ambulatory Visit (INDEPENDENT_AMBULATORY_CARE_PROVIDER_SITE_OTHER): Payer: 59

## 2017-11-24 DIAGNOSIS — K8500 Idiopathic acute pancreatitis without necrosis or infection: Secondary | ICD-10-CM

## 2017-11-24 DIAGNOSIS — R945 Abnormal results of liver function studies: Secondary | ICD-10-CM

## 2017-11-24 DIAGNOSIS — R7989 Other specified abnormal findings of blood chemistry: Secondary | ICD-10-CM

## 2017-11-24 LAB — COMPREHENSIVE METABOLIC PANEL
ALBUMIN: 3.9 g/dL (ref 3.5–5.2)
ALK PHOS: 150 U/L — AB (ref 39–117)
ALT: 40 U/L (ref 0–53)
AST: 30 U/L (ref 0–37)
BUN: 18 mg/dL (ref 6–23)
CHLORIDE: 96 meq/L (ref 96–112)
CO2: 25 mEq/L (ref 19–32)
Calcium: 9.6 mg/dL (ref 8.4–10.5)
Creatinine, Ser: 1.02 mg/dL (ref 0.40–1.50)
GFR: 87.58 mL/min (ref >60.00)
Glucose, Bld: 314 mg/dL — ABNORMAL HIGH (ref 70–99)
Potassium: 4.1 mEq/L (ref 3.5–5.1)
SODIUM: 129 meq/L — AB (ref 135–145)
TOTAL PROTEIN: 8.2 g/dL (ref 6.0–8.3)
Total Bilirubin: 0.9 mg/dL (ref 0.2–1.2)

## 2017-11-24 LAB — CBC WITH DIFFERENTIAL/PLATELET
BASOS PCT: 0.7 % (ref 0.0–3.0)
Basophils Absolute: 0 10*3/uL (ref 0.0–0.1)
EOS PCT: 1.4 % (ref 0.0–5.0)
Eosinophils Absolute: 0.1 10*3/uL (ref 0.0–0.7)
HEMATOCRIT: 35.6 % — AB (ref 39.0–52.0)
HEMOGLOBIN: 11.7 g/dL — AB (ref 13.0–17.0)
LYMPHS PCT: 21.6 % (ref 12.0–46.0)
Lymphs Abs: 1.4 10*3/uL (ref 0.7–4.0)
MCHC: 32.9 g/dL (ref 30.0–36.0)
MCV: 86.7 fl (ref 78.0–100.0)
MONOS PCT: 9 % (ref 3.0–12.0)
Monocytes Absolute: 0.6 10*3/uL (ref 0.1–1.0)
Neutro Abs: 4.3 10*3/uL (ref 1.4–7.7)
Neutrophils Relative %: 67.3 % (ref 43.0–77.0)
Platelets: 516 10*3/uL — ABNORMAL HIGH (ref 150.0–400.0)
RBC: 4.1 Mil/uL — AB (ref 4.22–5.81)
RDW: 14.1 % (ref 11.5–15.5)
WBC: 6.4 10*3/uL (ref 4.0–10.5)

## 2017-11-24 LAB — PROTIME-INR
INR: 1.2 ratio — ABNORMAL HIGH (ref 0.8–1.0)
PROTHROMBIN TIME: 13.9 s — AB (ref 9.6–13.1)

## 2017-11-26 ENCOUNTER — Telehealth: Payer: Self-pay

## 2017-11-26 ENCOUNTER — Telehealth: Payer: Self-pay | Admitting: Gastroenterology

## 2017-11-26 NOTE — Telephone Encounter (Signed)
-----   Message from Irving Copas., MD sent at 11/26/2017  4:29 PM EDT ----- Regarding: Follow up CT Dear Chong Sicilian, I was wondering if we could try and push the CT Scan that he has scheduled to next week, out by another week, as long as he is doing OK. The closer that his CT scan is to when we see him, the better to get a sense of his likelihood of needing to proceed with any endoscopic therapies in the future. Would you be able to reach out to patient tomorrow and work on rescheduling the CT? Thanks. Chester Holstein

## 2017-11-26 NOTE — Telephone Encounter (Signed)
The pt aware that he will be contacted as soon as results are reviewed

## 2017-11-27 ENCOUNTER — Telehealth: Payer: Self-pay | Admitting: Gastroenterology

## 2017-11-27 NOTE — Telephone Encounter (Signed)
The pt is seeing PCP today and will get notes until being seen by our office

## 2017-11-27 NOTE — Telephone Encounter (Signed)
Left message on machine to call back  

## 2017-11-27 NOTE — Telephone Encounter (Signed)
The CT has been rescheduled to 12/14/17 at 11 am the pt has been advised and re instructed.    FYI Dr Rush Landmark.

## 2017-11-27 NOTE — Telephone Encounter (Signed)
Perfect. Thanks.

## 2017-12-01 ENCOUNTER — Telehealth: Payer: Self-pay

## 2017-12-01 NOTE — Telephone Encounter (Signed)
Dr Rush Landmark the pt is calling stating he has finished his abx and has CT scheduled for 9/16 and f/u on 9/18 with you, he wants to know if he can return to work now or wait until CT scan and f/u?  Please advise

## 2017-12-01 NOTE — Telephone Encounter (Signed)
If Paul [name] is doing well and done with antibiotics and feels ready, he can go back to work at the end of this week or earlier if he feels improved. We do not need the CT for that solely, but will help [place] prognosticate for issues down the road. Please let me know. Thanks.

## 2017-12-02 ENCOUNTER — Telehealth: Payer: Self-pay | Admitting: Gastroenterology

## 2017-12-02 DIAGNOSIS — K8591 Acute pancreatitis with uninfected necrosis, unspecified: Secondary | ICD-10-CM

## 2017-12-02 DIAGNOSIS — Z792 Long term (current) use of antibiotics: Secondary | ICD-10-CM

## 2017-12-02 NOTE — Telephone Encounter (Signed)
The letter for the pt to return to work on 9/6 has been faxed to [phone] Short term disability team.

## 2017-12-02 NOTE — Telephone Encounter (Signed)
See alternate note  

## 2017-12-02 NOTE — Telephone Encounter (Signed)
Dr Rush Landmark can we have an order to remove the pt PICC line?

## 2017-12-02 NOTE — Telephone Encounter (Signed)
Left message on machine to call back  

## 2017-12-02 NOTE — Telephone Encounter (Signed)
Pt said he is returning your call 

## 2017-12-02 NOTE — Telephone Encounter (Signed)
Patient called and has completed his antibiotics. He no longer requires PICC line. Order placed to be removed. He is scheduled for follow up in the next few weeks with CT scan to be done prior.   Justice Britain, MD [facility] Office # [phone]

## 2017-12-03 ENCOUNTER — Telehealth: Payer: Self-pay

## 2017-12-03 ENCOUNTER — Inpatient Hospital Stay: Admission: RE | Admit: 2017-12-03 | Payer: 59 | Source: Ambulatory Visit

## 2017-12-03 NOTE — Telephone Encounter (Signed)
The form has been faxed per request

## 2017-12-08 ENCOUNTER — Other Ambulatory Visit: Payer: Self-pay

## 2017-12-08 ENCOUNTER — Encounter (HOSPITAL_COMMUNITY): Payer: Self-pay | Admitting: *Deleted

## 2017-12-08 ENCOUNTER — Inpatient Hospital Stay (HOSPITAL_COMMUNITY)
Admission: EM | Admit: 2017-12-08 | Discharge: 2017-12-17 | DRG: 871 | Disposition: A | Discharge status: Home Health | Payer: 59 | Attending: Internal Medicine | Admitting: Internal Medicine

## 2017-12-08 ENCOUNTER — Emergency Department (HOSPITAL_COMMUNITY): Payer: 59

## 2017-12-08 ENCOUNTER — Telehealth: Payer: Self-pay | Admitting: Gastroenterology

## 2017-12-08 DIAGNOSIS — Z91018 Allergy to other foods: Secondary | ICD-10-CM | POA: Diagnosis not present

## 2017-12-08 DIAGNOSIS — E872 Acidosis: Secondary | ICD-10-CM | POA: Diagnosis present

## 2017-12-08 DIAGNOSIS — K219 Gastro-esophageal reflux disease without esophagitis: Secondary | ICD-10-CM | POA: Diagnosis present

## 2017-12-08 DIAGNOSIS — Z978 Presence of other specified devices: Secondary | ICD-10-CM | POA: Diagnosis not present

## 2017-12-08 DIAGNOSIS — Z905 Acquired absence of kidney: Secondary | ICD-10-CM | POA: Diagnosis not present

## 2017-12-08 DIAGNOSIS — K859 Acute pancreatitis without necrosis or infection, unspecified: Secondary | ICD-10-CM

## 2017-12-08 DIAGNOSIS — E785 Hyperlipidemia, unspecified: Secondary | ICD-10-CM | POA: Diagnosis present

## 2017-12-08 DIAGNOSIS — Z794 Long term (current) use of insulin: Secondary | ICD-10-CM | POA: Diagnosis not present

## 2017-12-08 DIAGNOSIS — Z85528 Personal history of other malignant neoplasm of kidney: Secondary | ICD-10-CM | POA: Diagnosis not present

## 2017-12-08 DIAGNOSIS — E8809 Other disorders of plasma-protein metabolism, not elsewhere classified: Secondary | ICD-10-CM | POA: Diagnosis not present

## 2017-12-08 DIAGNOSIS — K863 Pseudocyst of pancreas: Secondary | ICD-10-CM | POA: Diagnosis present

## 2017-12-08 DIAGNOSIS — E46 Unspecified protein-calorie malnutrition: Secondary | ICD-10-CM | POA: Diagnosis not present

## 2017-12-08 DIAGNOSIS — R739 Hyperglycemia, unspecified: Secondary | ICD-10-CM | POA: Diagnosis present

## 2017-12-08 DIAGNOSIS — E876 Hypokalemia: Secondary | ICD-10-CM | POA: Diagnosis present

## 2017-12-08 DIAGNOSIS — Z87891 Personal history of nicotine dependence: Secondary | ICD-10-CM | POA: Diagnosis not present

## 2017-12-08 DIAGNOSIS — Z79899 Other long term (current) drug therapy: Secondary | ICD-10-CM

## 2017-12-08 DIAGNOSIS — D509 Iron deficiency anemia, unspecified: Secondary | ICD-10-CM | POA: Diagnosis present

## 2017-12-08 DIAGNOSIS — K3189 Other diseases of stomach and duodenum: Secondary | ICD-10-CM | POA: Diagnosis present

## 2017-12-08 DIAGNOSIS — K8592 Acute pancreatitis with infected necrosis, unspecified: Secondary | ICD-10-CM | POA: Diagnosis present

## 2017-12-08 DIAGNOSIS — K8689 Other specified diseases of pancreas: Secondary | ICD-10-CM

## 2017-12-08 DIAGNOSIS — K269 Duodenal ulcer, unspecified as acute or chronic, without hemorrhage or perforation: Secondary | ICD-10-CM | POA: Diagnosis present

## 2017-12-08 DIAGNOSIS — R509 Fever, unspecified: Secondary | ICD-10-CM | POA: Diagnosis present

## 2017-12-08 DIAGNOSIS — R001 Bradycardia, unspecified: Secondary | ICD-10-CM | POA: Diagnosis not present

## 2017-12-08 DIAGNOSIS — D649 Anemia, unspecified: Secondary | ICD-10-CM | POA: Diagnosis present

## 2017-12-08 DIAGNOSIS — R0902 Hypoxemia: Secondary | ICD-10-CM | POA: Diagnosis present

## 2017-12-08 DIAGNOSIS — A419 Sepsis, unspecified organism: Secondary | ICD-10-CM

## 2017-12-08 DIAGNOSIS — K8591 Acute pancreatitis with uninfected necrosis, unspecified: Secondary | ICD-10-CM | POA: Diagnosis present

## 2017-12-08 DIAGNOSIS — E7800 Pure hypercholesterolemia, unspecified: Secondary | ICD-10-CM | POA: Diagnosis present

## 2017-12-08 DIAGNOSIS — E1165 Type 2 diabetes mellitus with hyperglycemia: Secondary | ICD-10-CM | POA: Diagnosis present

## 2017-12-08 DIAGNOSIS — K862 Cyst of pancreas: Secondary | ICD-10-CM | POA: Diagnosis not present

## 2017-12-08 DIAGNOSIS — R6510 Systemic inflammatory response syndrome (SIRS) of non-infectious origin without acute organ dysfunction: Secondary | ICD-10-CM | POA: Diagnosis not present

## 2017-12-08 DIAGNOSIS — I10 Essential (primary) hypertension: Secondary | ICD-10-CM | POA: Diagnosis present

## 2017-12-08 DIAGNOSIS — B9689 Other specified bacterial agents as the cause of diseases classified elsewhere: Secondary | ICD-10-CM | POA: Diagnosis not present

## 2017-12-08 DIAGNOSIS — K2970 Gastritis, unspecified, without bleeding: Secondary | ICD-10-CM | POA: Diagnosis not present

## 2017-12-08 DIAGNOSIS — Z85520 Personal history of malignant carcinoid tumor of kidney: Secondary | ICD-10-CM | POA: Diagnosis not present

## 2017-12-08 HISTORY — DX: Acute pancreatitis without necrosis or infection, unspecified: K85.90

## 2017-12-08 LAB — CBC
HEMATOCRIT: 36.6 % — AB (ref 39.0–52.0)
HEMOGLOBIN: 11.8 g/dL — AB (ref 13.0–17.0)
MCH: 28 pg (ref 26.0–34.0)
MCHC: 32.2 g/dL (ref 30.0–36.0)
MCV: 86.9 fL (ref 78.0–100.0)
Platelets: 235 10*3/uL (ref 150–400)
RBC: 4.21 MIL/uL — ABNORMAL LOW (ref 4.22–5.81)
RDW: 13.1 % (ref 11.5–15.5)
WBC: 13.5 10*3/uL — ABNORMAL HIGH (ref 4.0–10.5)

## 2017-12-08 LAB — COMPREHENSIVE METABOLIC PANEL
ALK PHOS: 113 U/L (ref 38–126)
ALT: 41 U/L (ref 0–44)
AST: 42 U/L — ABNORMAL HIGH (ref 15–41)
Albumin: 3.4 g/dL — ABNORMAL LOW (ref 3.5–5.0)
Anion gap: 14 (ref 5–15)
BILIRUBIN TOTAL: 1.5 mg/dL — AB (ref 0.3–1.2)
BUN: 8 mg/dL (ref 6–20)
CALCIUM: 9.5 mg/dL (ref 8.9–10.3)
CO2: 21 mmol/L — ABNORMAL LOW (ref 22–32)
Chloride: 98 mmol/L (ref 98–111)
Creatinine, Ser: 1.17 mg/dL (ref 0.61–1.24)
GFR calc Af Amer: >60 mL/min (ref >60)
Glucose, Bld: 220 mg/dL — ABNORMAL HIGH (ref 70–99)
POTASSIUM: 4 mmol/L (ref 3.5–5.1)
Sodium: 133 mmol/L — ABNORMAL LOW (ref 135–145)
TOTAL PROTEIN: 7.2 g/dL (ref 6.5–8.1)

## 2017-12-08 LAB — URINALYSIS, ROUTINE W REFLEX MICROSCOPIC
BACTERIA UA: NONE SEEN
BILIRUBIN URINE: NEGATIVE
Glucose, UA: >=500 mg/dL — AB
Ketones, ur: NEGATIVE mg/dL
Leukocytes, UA: NEGATIVE
Nitrite: NEGATIVE
Protein, ur: NEGATIVE mg/dL
SPECIFIC GRAVITY, URINE: 1.003 — AB (ref 1.005–1.030)
pH: 6 (ref 5.0–8.0)

## 2017-12-08 LAB — LIPASE, BLOOD: Lipase: 41 U/L (ref 11–51)

## 2017-12-08 LAB — I-STAT CG4 LACTIC ACID, ED
LACTIC ACID, VENOUS: 1.96 mmol/L — AB (ref 0.5–1.9)
LACTIC ACID, VENOUS: 2.56 mmol/L — AB (ref 0.5–1.9)

## 2017-12-08 MED ORDER — SODIUM CHLORIDE 0.9 % IV BOLUS
1000.0000 mL | Freq: Once | INTRAVENOUS | Status: AC
  Administered 2017-12-08: 1000 mL via INTRAVENOUS

## 2017-12-08 MED ORDER — IOPAMIDOL (ISOVUE-300) INJECTION 61%
INTRAVENOUS | Status: AC
  Filled 2017-12-08: qty 100

## 2017-12-08 MED ORDER — LACTATED RINGERS IV BOLUS
1000.0000 mL | Freq: Once | INTRAVENOUS | Status: AC
  Administered 2017-12-09: 1000 mL via INTRAVENOUS

## 2017-12-08 MED ORDER — KETOROLAC TROMETHAMINE 15 MG/ML IJ SOLN
15.0000 mg | Freq: Once | INTRAMUSCULAR | Status: AC
  Administered 2017-12-08: 15 mg via INTRAVENOUS
  Filled 2017-12-08: qty 1

## 2017-12-08 MED ORDER — IOPAMIDOL (ISOVUE-300) INJECTION 61%
100.0000 mL | Freq: Once | INTRAVENOUS | Status: AC | PRN
  Administered 2017-12-08: 100 mL via INTRAVENOUS

## 2017-12-08 MED ORDER — ACETAMINOPHEN 325 MG PO TABS
650.0000 mg | ORAL_TABLET | Freq: Once | ORAL | Status: AC
  Administered 2017-12-08: 650 mg via ORAL
  Filled 2017-12-08: qty 2

## 2017-12-08 MED ORDER — PIPERACILLIN-TAZOBACTAM 3.375 G IVPB 30 MIN
3.3750 g | Freq: Once | INTRAVENOUS | Status: AC
  Administered 2017-12-09: 3.375 g via INTRAVENOUS
  Filled 2017-12-08: qty 50

## 2017-12-08 NOTE — ED Notes (Signed)
Pt. To XRAY via stretcher. 

## 2017-12-08 NOTE — ED Triage Notes (Signed)
Pt reports recent hx of pancreatitis. Onset last night of fever and nausea. Denies abd pain.

## 2017-12-08 NOTE — Telephone Encounter (Signed)
Dr Mansouraty FYI  

## 2017-12-08 NOTE — ED Notes (Signed)
Pt. To CT via stretcher. 

## 2017-12-08 NOTE — ED Notes (Addendum)
Ice packs placed under arms due to temperature. Sedonia Small, MD made aware of temperature.

## 2017-12-08 NOTE — Telephone Encounter (Signed)
Temp at the lowest since this morning was 101.5 F  Has been as high as 103 F.  He also has some mild discomfort in the abd and nausea.  He was advised to head to the ED for eval of pancreatitis.  Recent hospitalization for acute necrotizing pancreatitis.  The pt has agreed to ED eval.

## 2017-12-08 NOTE — ED Provider Notes (Signed)
[facility] Emergency Department Provider Note MRN:  [id number]  Arrival date & time: 12/08/17     Chief Complaint   Fever and Nausea   History of Present Illness   Paul [name] is a 37 y.o. year-old male with a history of pancreatitis presenting to the ED with chief complaint of fever.  Patient explains that about a month ago he was admitted to the hospital for idiopathic pancreatitis.  Admitted for 2 weeks, recovering at home for 2 weeks, was feeling well.  2 to 3 days ago, began expressing recurrence of fever, intermittent.  Associated with mild nausea, 2 episodes of nonbloody nonbilious emesis this morning.  Endorsing recent stuffy nose, mild headache, no neck pain, no chest pain, no shortness of breath, no cough, no abdominal pain, no dysuria, no rash.  Review of Systems  A complete 10 system review of systems was obtained and all systems are negative except as noted in the HPI and PMH.   Patient's Health History    Past Medical History:  Diagnosis Date  . Cancer of kidney [facility]) 2011   "left"  . Elevated blood uric acid level    "I take RX for it; Allopurinol" (11/04/2017)  . GERD (gastroesophageal reflux disease)   . High cholesterol   . Hypertension   . Migraine    "only in my teens" (11/04/2017)  . Pancreatitis     Past Surgical History:  Procedure Laterality Date  . NEPHRECTOMY Left 2011   "kidney cancer"  . UPPER GASTROINTESTINAL ENDOSCOPY  02/2017    History reviewed. No pertinent family history.  Social History   Socioeconomic History  . Marital status: Married    Spouse name: Not on file  . Number of children: Not on file  . Years of education: Not on file  . Highest education level: Not on file  Occupational History  . Not on file  Social Needs  . Financial resource strain: Not on file  . Food insecurity:    Worry: Not on file    Inability: Not on file  . Transportation needs:    Medical: Not on file    Non-medical: Not on file    Tobacco Use  . Smoking status: Former Smoker    Packs/day: 0.50    Years: 11.00    Pack years: 5.50    Types: Cigarettes    Last attempt to quit: 2011    Years since quitting: 8.6  . Smokeless tobacco: Never Used  Substance and Sexual Activity  . Alcohol use: Yes    Alcohol/week: 4.0 standard drinks    Types: 4 Cans of beer per week  . Drug use: Never  . Sexual activity: Yes  Lifestyle  . Physical activity:    Days per week: Not on file    Minutes per session: Not on file  . Stress: Not on file  Relationships  . Social connections:    Talks on phone: Not on file    Gets together: Not on file    Attends religious service: Not on file    Active member of club or organization: Not on file    Attends meetings of clubs or organizations: Not on file    Relationship status: Not on file  . Intimate partner violence:    Fear of current or ex partner: Not on file    Emotionally abused: Not on file    Physically abused: Not on file    Forced sexual activity: Not on file  Other Topics Concern  . Not on file  Social History Narrative  . Not on file     Physical Exam  Vital Signs and Nursing Notes reviewed Vitals:   12/08/17 2255 12/08/17 2300  BP:  117/82  Pulse:  (!) 105  Resp:  (!) 22  Temp: 99.3 F (37.4 C)   SpO2:  99%    CONSTITUTIONAL: Well-appearing, NAD NEURO:  Alert and oriented x 3, no focal deficits EYES:  eyes equal and reactive ENT/NECK:  no LAD, no JVD CARDIO: Tachycardic rate, well-perfused, normal S1 and S2 PULM:  CTAB no wheezing or rhonchi GI/GU:  normal bowel sounds, non-distended, non-tender MSK/SPINE:  No gross deformities, no edema SKIN:  no rash, atraumatic PSYCH:  Appropriate speech and behavior  Diagnostic and Interventional Summary    EKG Interpretation  Date/Time:    Ventricular Rate:    PR Interval:    QRS Duration:   QT Interval:    QTC Calculation:   R Axis:     Text Interpretation:        Labs Reviewed  COMPREHENSIVE  METABOLIC PANEL - Abnormal; Notable for the following components:      Result Value   Sodium 133 (*)    CO2 21 (*)    Glucose, Bld 220 (*)    Albumin 3.4 (*)    AST 42 (*)    Total Bilirubin 1.5 (*)    All other components within normal limits  CBC - Abnormal; Notable for the following components:   WBC 13.5 (*)    RBC 4.21 (*)    Hemoglobin 11.8 (*)    HCT 36.6 (*)    All other components within normal limits  URINALYSIS, ROUTINE W REFLEX MICROSCOPIC - Abnormal; Notable for the following components:   Specific Gravity, Urine 1.003 (*)    Glucose, UA >=500 (*)    Hgb urine dipstick SMALL (*)    All other components within normal limits  I-STAT CG4 LACTIC ACID, ED - Abnormal; Notable for the following components:   Lactic Acid, Venous 1.96 (*)    All other components within normal limits  I-STAT CG4 LACTIC ACID, ED - Abnormal; Notable for the following components:   Lactic Acid, Venous 2.56 (*)    All other components within normal limits  CULTURE, BLOOD (ROUTINE X 2)  CULTURE, BLOOD (ROUTINE X 2)  LIPASE, BLOOD    CT ABDOMEN PELVIS W CONTRAST  Final Result    DG Chest 2 View  Final Result      Medications  iopamidol (ISOVUE-300) 61 % injection (has no administration in time range)  piperacillin-tazobactam (ZOSYN) IVPB 3.375 g (has no administration in time range)  lactated ringers bolus 1,000 mL (has no administration in time range)  lactated ringers bolus 1,000 mL (has no administration in time range)  sodium chloride 0.9 % bolus 1,000 mL (0 mLs Intravenous Stopped 12/08/17 2254)  acetaminophen (TYLENOL) tablet 650 mg (650 mg Oral Given 12/08/17 1941)  sodium chloride 0.9 % bolus 1,000 mL (0 mLs Intravenous Stopped 12/08/17 2255)  ketorolac (TORADOL) 15 MG/ML injection 15 mg (15 mg Intravenous Given 12/08/17 2119)  iopamidol (ISOVUE-300) 61 % injection 100 mL (100 mLs Intravenous Contrast Given 12/08/17 2214)     Procedures Critical Care Critical Care  Documentation Critical care time provided by me (excluding procedures): 38 minutes  Condition necessitating critical care: Sepsis, severe necrotic pancreatitis  Components of critical care management: reviewing of prior records, laboratory and imaging interpretation, frequent re-examination and reassessment  of vital signs, administration of IV fluid resuscitation, IV antibiotics, discussion with consulting services.    ED Course and Medical Decision Making  I have reviewed the triage vital signs and the nursing notes.  Pertinent labs & imaging results that were available during my care of the patient were reviewed by me and considered in my medical decision making (see below for details).  Fever of unknown origin in this 37 year old male with history of recent pancreatitis.  Will provide fluid resuscitation, begin infectious work-up.  Without clear source in the x-ray of the chest or the urine, may need to repeat CT the abdomen despite a largely unremarkable exam.  CT reveals severe necrotic pancreatitis with some changes noted, lactate rising despite fluids, admitted to hospital service for further care and evaluation.  Barth Kirks. Sedonia Small, [place] [email or web address]  Final Clinical Impressions(s) / ED Diagnoses     ICD-10-CM   1. Necrotizing pancreatitis K85.91   2. Fever R50.9 DG Chest 2 View    DG Chest 2 View  3. Sepsis, due to unspecified organism [facility]) A41.9     ED Discharge Orders    None         Maudie Flakes, MD 12/08/17 2355

## 2017-12-08 NOTE — ED Provider Notes (Signed)
Patient placed in Quick Look pathway, seen and evaluated   Chief Complaint: fevers, nausea  HPI:  Hx pancreatitis. Nausea today, gagging but no vomiting. Temp 101.76F. Denies abdominal pain, CP, SOB, cough. Headache and nasal congestion with cough. Denies recent travel.  Denies constipation.  Sent from his gastroenterologist to the ED for rule out of pancreatitis.  States headache is very mild right now.  ROS: Positive for nausea, fevers, nasal congestion, headache Negative for vomiting, abdominal pain, diarrhea.  Physical Exam:   Gen: No distress  Neuro: Awake and Alert  Skin: Warm    Focused Exam: No nuchal rigidity.  Abdomen is nontender, mildly distended.  Lungs clear to auscultation bilaterally.  Tachycardic.  No frontal or maxillary sinus tenderness, left-sided mucosal edema noted.  Posterior oropharynx without erythema, tonsillar hypertrophy, exudates, or uvular deviation.  TMs without erythema or bulging bilaterally.   Initiation of care has begun. The patient has been counseled on the process, plan, and necessity for staying for the completion/evaluation, and the remainder of the medical screening examination    Debroah Baller 12/08/17 1658    Lacretia Leigh, MD 12/09/17 [id number]

## 2017-12-08 NOTE — Telephone Encounter (Signed)
Vito & Judson Roch, this patient will be heading to the ED for further evaluation.  Looks like he's been having a fever but may also have some sinus issues.  Please keep me in the loop as to what goes on if he is kept in the hospital as well as possible CT scan, ends up showing for planning purposes moving forward for possible procedures. Still awaiting [employer] stents before necrosectomy attempt.

## 2017-12-09 ENCOUNTER — Inpatient Hospital Stay (HOSPITAL_COMMUNITY): Payer: 59

## 2017-12-09 ENCOUNTER — Encounter (HOSPITAL_COMMUNITY): Payer: Self-pay | Admitting: Internal Medicine

## 2017-12-09 DIAGNOSIS — Z85520 Personal history of malignant carcinoid tumor of kidney: Secondary | ICD-10-CM

## 2017-12-09 DIAGNOSIS — Z91018 Allergy to other foods: Secondary | ICD-10-CM

## 2017-12-09 DIAGNOSIS — K8591 Acute pancreatitis with uninfected necrosis, unspecified: Secondary | ICD-10-CM

## 2017-12-09 DIAGNOSIS — Z87891 Personal history of nicotine dependence: Secondary | ICD-10-CM

## 2017-12-09 DIAGNOSIS — R6510 Systemic inflammatory response syndrome (SIRS) of non-infectious origin without acute organ dysfunction: Secondary | ICD-10-CM

## 2017-12-09 DIAGNOSIS — Z905 Acquired absence of kidney: Secondary | ICD-10-CM

## 2017-12-09 DIAGNOSIS — K859 Acute pancreatitis without necrosis or infection, unspecified: Secondary | ICD-10-CM

## 2017-12-09 DIAGNOSIS — A419 Sepsis, unspecified organism: Secondary | ICD-10-CM

## 2017-12-09 DIAGNOSIS — R739 Hyperglycemia, unspecified: Secondary | ICD-10-CM | POA: Diagnosis present

## 2017-12-09 LAB — CBC WITH DIFFERENTIAL/PLATELET
BASOS PCT: 0 %
Basophils Absolute: 0 10*3/uL (ref 0.0–0.1)
Eosinophils Absolute: 0 10*3/uL (ref 0.0–0.7)
Eosinophils Relative: 0 %
HCT: 33.1 % — ABNORMAL LOW (ref 39.0–52.0)
Hemoglobin: 10.6 g/dL — ABNORMAL LOW (ref 13.0–17.0)
LYMPHS PCT: 21 %
Lymphs Abs: 1.5 10*3/uL (ref 0.7–4.0)
MCH: 27.5 pg (ref 26.0–34.0)
MCHC: 32 g/dL (ref 30.0–36.0)
MCV: 85.8 fL (ref 78.0–100.0)
MONO ABS: 0.9 10*3/uL (ref 0.1–1.0)
Monocytes Relative: 12 %
NEUTROS ABS: 4.9 10*3/uL (ref 1.7–7.7)
NEUTROS PCT: 67 %
PLATELETS: 162 10*3/uL (ref 150–400)
RBC: 3.86 MIL/uL — ABNORMAL LOW (ref 4.22–5.81)
RDW: 13 % (ref 11.5–15.5)
WBC MORPHOLOGY: INCREASED
WBC: 7.3 10*3/uL (ref 4.0–10.5)

## 2017-12-09 LAB — HEPATIC FUNCTION PANEL
ALK PHOS: 97 U/L (ref 38–126)
ALT: 38 U/L (ref 0–44)
AST: 34 U/L (ref 15–41)
Albumin: 2.9 g/dL — ABNORMAL LOW (ref 3.5–5.0)
BILIRUBIN INDIRECT: 1.3 mg/dL — AB (ref 0.3–0.9)
BILIRUBIN TOTAL: 2 mg/dL — AB (ref 0.3–1.2)
Bilirubin, Direct: 0.7 mg/dL — ABNORMAL HIGH (ref 0.0–0.2)
TOTAL PROTEIN: 6.1 g/dL — AB (ref 6.5–8.1)

## 2017-12-09 LAB — BASIC METABOLIC PANEL
ANION GAP: 11 (ref 5–15)
BUN: 8 mg/dL (ref 6–20)
CALCIUM: 8.5 mg/dL — AB (ref 8.9–10.3)
CO2: 20 mmol/L — ABNORMAL LOW (ref 22–32)
Chloride: 105 mmol/L (ref 98–111)
Creatinine, Ser: 1.17 mg/dL (ref 0.61–1.24)
GFR calc non Af Amer: >60 mL/min (ref >60)
Glucose, Bld: 167 mg/dL — ABNORMAL HIGH (ref 70–99)
Potassium: 3.6 mmol/L (ref 3.5–5.1)
SODIUM: 136 mmol/L (ref 135–145)

## 2017-12-09 LAB — TYPE AND SCREEN
ABO/RH(D): A POS
ANTIBODY SCREEN: NEGATIVE

## 2017-12-09 LAB — GLUCOSE, CAPILLARY
GLUCOSE-CAPILLARY: 127 mg/dL — AB (ref 70–99)
Glucose-Capillary: 140 mg/dL — ABNORMAL HIGH (ref 70–99)
Glucose-Capillary: 111 mg/dL — ABNORMAL HIGH (ref 70–99)
Glucose-Capillary: 113 mg/dL — ABNORMAL HIGH (ref 70–99)

## 2017-12-09 LAB — PROCALCITONIN: Procalcitonin: 1.29 ng/mL

## 2017-12-09 LAB — MRSA PCR SCREENING: MRSA BY PCR: NEGATIVE

## 2017-12-09 LAB — LACTIC ACID, PLASMA
LACTIC ACID, VENOUS: 1.9 mmol/L (ref 0.5–1.9)
Lactic Acid, Venous: 1.1 mmol/L (ref 0.5–1.9)

## 2017-12-09 LAB — PROTIME-INR
INR: 1.37
Prothrombin Time: 16.7 seconds — ABNORMAL HIGH (ref 11.4–15.2)

## 2017-12-09 LAB — APTT: aPTT: 35 seconds (ref 24–36)

## 2017-12-09 LAB — ABO/RH: ABO/RH(D): A POS

## 2017-12-09 MED ORDER — SODIUM CHLORIDE 0.9 % IV BOLUS
500.0000 mL | Freq: Once | INTRAVENOUS | Status: AC
  Administered 2017-12-09: 500 mL via INTRAVENOUS

## 2017-12-09 MED ORDER — IBUPROFEN 200 MG PO TABS
400.0000 mg | ORAL_TABLET | Freq: Once | ORAL | Status: AC
  Administered 2017-12-09: 400 mg via ORAL
  Filled 2017-12-09: qty 2

## 2017-12-09 MED ORDER — ONDANSETRON HCL 4 MG PO TABS: 4.0000 mg | ORAL_TABLET | Freq: Four times a day (QID) | ORAL | Status: DC | PRN

## 2017-12-09 MED ORDER — LACTATED RINGERS IV SOLN
INTRAVENOUS | Status: DC
  Administered 2017-12-09: 03:00:00 via INTRAVENOUS

## 2017-12-09 MED ORDER — VANCOMYCIN HCL 10 G IV SOLR
1500.0000 mg | Freq: Once | INTRAVENOUS | Status: AC
  Administered 2017-12-09: 1500 mg via INTRAVENOUS
  Filled 2017-12-09: qty 1500

## 2017-12-09 MED ORDER — VANCOMYCIN HCL IN DEXTROSE 750-5 MG/150ML-% IV SOLN
750.0000 mg | Freq: Three times a day (TID) | INTRAVENOUS | Status: DC
  Filled 2017-12-09: qty 150

## 2017-12-09 MED ORDER — INSULIN ASPART 100 UNIT/ML ~~LOC~~ SOLN
0.0000 [IU] | Freq: Three times a day (TID) | SUBCUTANEOUS | Status: DC
  Administered 2017-12-09: 3 [IU] via SUBCUTANEOUS
  Administered 2017-12-10 – 2017-12-13 (×3): 2 [IU] via SUBCUTANEOUS
  Administered 2017-12-13 (×2): 1 [IU] via SUBCUTANEOUS
  Administered 2017-12-14: 2 [IU] via SUBCUTANEOUS
  Administered 2017-12-14 (×2): 1 [IU] via SUBCUTANEOUS
  Administered 2017-12-15 – 2017-12-16 (×4): 2 [IU] via SUBCUTANEOUS
  Administered 2017-12-16 (×2): 1 [IU] via SUBCUTANEOUS
  Administered 2017-12-17 (×2): 2 [IU] via SUBCUTANEOUS

## 2017-12-09 MED ORDER — AMLODIPINE BESYLATE 5 MG PO TABS: 2.5000 mg | ORAL_TABLET | Freq: Every day | ORAL | Status: DC

## 2017-12-09 MED ORDER — ACETAMINOPHEN 325 MG PO TABS
650.0000 mg | ORAL_TABLET | Freq: Four times a day (QID) | ORAL | Status: DC | PRN
  Administered 2017-12-09: 650 mg via ORAL
  Filled 2017-12-09 (×3): qty 2

## 2017-12-09 MED ORDER — ACETAMINOPHEN 650 MG RE SUPP: 650.0000 mg | Freq: Four times a day (QID) | RECTAL | Status: DC | PRN

## 2017-12-09 MED ORDER — SODIUM CHLORIDE 0.9 % IV SOLN
1.0000 g | Freq: Three times a day (TID) | INTRAVENOUS | Status: DC
  Administered 2017-12-09 – 2017-12-12 (×10): 1 g via INTRAVENOUS
  Filled 2017-12-09 (×11): qty 1

## 2017-12-09 MED ORDER — SODIUM CHLORIDE 0.9 % IV SOLN
INTRAVENOUS | Status: DC
  Administered 2017-12-09: 12:00:00 via INTRAVENOUS

## 2017-12-09 MED ORDER — VITAMIN B-12 1000 MCG PO TABS
1000.0000 ug | ORAL_TABLET | Freq: Every day | ORAL | Status: DC
  Administered 2017-12-09 – 2017-12-17 (×4): 1000 ug via ORAL
  Filled 2017-12-09 (×9): qty 1

## 2017-12-09 MED ORDER — ONDANSETRON HCL 4 MG/2ML IJ SOLN
4.0000 mg | Freq: Four times a day (QID) | INTRAMUSCULAR | Status: DC | PRN
  Administered 2017-12-09 (×2): 4 mg via INTRAVENOUS
  Filled 2017-12-09 (×2): qty 2

## 2017-12-09 MED ORDER — ATENOLOL 25 MG PO TABS
50.0000 mg | ORAL_TABLET | Freq: Every day | ORAL | Status: DC
  Administered 2017-12-09 – 2017-12-12 (×4): 50 mg via ORAL
  Filled 2017-12-09 (×4): qty 2

## 2017-12-09 MED ORDER — ACETAMINOPHEN 325 MG PO TABS: 650.0000 mg | ORAL_TABLET | ORAL | Status: DC | PRN

## 2017-12-09 MED ORDER — ALLOPURINOL 100 MG PO TABS
100.0000 mg | ORAL_TABLET | Freq: Two times a day (BID) | ORAL | Status: DC
  Administered 2017-12-09 – 2017-12-17 (×17): 100 mg via ORAL
  Filled 2017-12-09 (×17): qty 1

## 2017-12-09 MED ORDER — IBUPROFEN 200 MG PO TABS
400.0000 mg | ORAL_TABLET | Freq: Three times a day (TID) | ORAL | Status: DC | PRN
  Administered 2017-12-09 – 2017-12-11 (×2): 400 mg via ORAL
  Filled 2017-12-09 (×2): qty 2

## 2017-12-09 NOTE — H&P (Signed)
History and Physical    Paul [name] NWG:[id number] DOB: [birth year] DOA: 12/08/2017  PCP: Karleen Hampshire., MD  Patient coming from: Home.  Chief Complaint: Fever and chills.  HPI: Paul [name] is a 37 y.o. male with history of renal cell carcinoma status post left-sided nephrectomy in 2012 who was admitted for idiopathic pancreatitis last month and had necrotizing pancreatitis eventually discharged home on IV antibiotics last dose was on December 02, 2017 last week presents to the ER with complaints of fever and chills for last 2 days.  Denies any nausea vomiting abdominal pain diarrhea able to eat well denies any chest pain productive cough headache visual symptoms.  Patient also was discharged on insulin for hyperglycemia which patient states has improved and has stopped using long-acting insulin but last 2 days his blood sugar started increasing again.  ED Course: In the ER patient had a fever of 102 F chest x-ray and urine were unremarkable.  CT of the abdomen shows features of necrotizing pancreatitis with no definite abscess.  Blood cultures are obtained started on fluid per sepsis protocol and admitted for possible developing sepsis.  Likely source could be intra-abdominal.  Review of Systems: As per HPI, rest all negative.   Past Medical History:  Diagnosis Date  . Cancer of kidney [facility]) 2011   "left"  . Elevated blood uric acid level    "I take RX for it; Allopurinol" (11/04/2017)  . GERD (gastroesophageal reflux disease)   . High cholesterol   . Hypertension   . Migraine    "only in my teens" (11/04/2017)  . Pancreatitis     Past Surgical History:  Procedure Laterality Date  . NEPHRECTOMY Left 2011   "kidney cancer"  . UPPER GASTROINTESTINAL ENDOSCOPY  02/2017     reports that he quit smoking about 8 years ago. His smoking use included cigarettes. He has a 5.50 pack-year smoking history. He has never used smokeless tobacco. He reports that he drinks about 4.0 standard drinks of  alcohol per week. He reports that he does not use drugs.  Allergies  Allergen Reactions  . Honey Diarrhea and Nausea And Vomiting    History reviewed. No pertinent family history.  Prior to Admission medications   Medication Sig Start Date End Date Taking? Authorizing Provider  allopurinol (ZYLOPRIM) 100 MG tablet Take 100 mg by mouth 2 (two) times daily. 10/07/17  Yes [provider]  amLODipine (NORVASC) 2.5 MG tablet Take 2.5 mg by mouth at bedtime.  10/07/17  Yes [provider]  atenolol (TENORMIN) 50 MG tablet Take 50 mg by mouth daily. 10/27/17  Yes [provider]  ibuprofen (ADVIL,MOTRIN) 200 MG tablet Take 200 mg by mouth every 6 (six) hours as needed for fever.   Yes [provider]  insulin aspart (NOVOLOG) 100 UNIT/ML FlexPen Before each meal 3 times a day, 140-199 - 2 units, 200-250 - 4 units, 251-299 - 6 units,  300-349 - 8 units,  350 or above 10 units. Insulin PEN if approved, provide syringes and needles if needed. Patient taking differently: Inject 0-10 Units into the skin 3 (three) times daily before meals. Per sliding scale: 140-199 - 2 units, 200-250 - 4 units, 251-299 - 6 units,  300-349 - 8 units,  350 or above 10 units. 11/18/17  Yes Florencia Reasons, MD  insulin glargine (LANTUS) 100 unit/mL SOPN Inject 0.15 mLs (15 Units total) into the skin at bedtime. Patient taking differently: Inject 15 Units into the skin at bedtime.  Do not take if bs is 80 or lower 11/18/17  Yes Florencia Reasons, MD  vitamin B-12 (CYANOCOBALAMIN) 1000 MCG tablet Take 1,000 mcg by mouth daily.   Yes [provider]  Amino Acids-Protein Hydrolys (FEEDING SUPPLEMENT, PRO-STAT SUGAR FREE 64,) LIQD Take 30 mLs by mouth 3 (three) times daily between meals. Patient not taking: Reported on 12/08/2017 11/18/17   Florencia Reasons, MD  blood glucose meter kit and supplies Dispense based on patient and insurance preference. Use up to four times daily as directed. (FOR ICD-10 E10.9, E11.9).  11/18/17   Florencia Reasons, MD  Insulin Pen Needle 31G X 5 MM MISC For insulin injection, please provide a month supply. 11/18/17   Florencia Reasons, MD  LEVEMIR FLEXTOUCH 100 UNIT/ML Pen Inject 15 Units into the skin at bedtime. 11/27/17   [provider]    Physical Exam: Vitals:   12/08/17 2200 12/08/17 2255 12/08/17 2300 12/09/17 0017  BP: 114/87  117/82   Pulse: (!) 112  (!) 105 (!) 109  Resp: (!) 25  (!) 22 20  Temp:  99.3 F (37.4 C)    TempSrc:  Oral    SpO2: 99%  99% 99%      Constitutional: Moderately built and nourished. Vitals:   12/08/17 2200 12/08/17 2255 12/08/17 2300 12/09/17 0017  BP: 114/87  117/82   Pulse: (!) 112  (!) 105 (!) 109  Resp: (!) 25  (!) 22 20  Temp:  99.3 F (37.4 C)    TempSrc:  Oral    SpO2: 99%  99% 99%   Eyes: Anicteric no pallor. ENMT: No discharge from the ears eyes nose or mouth. Neck: No mass or.  No neck rigidity.  No JVD appreciated. Respiratory: No rhonchi or crepitations. Cardiovascular: S1-S2 heard no murmurs appreciated. Abdomen: Soft nontender bowel sounds present. Musculoskeletal: No edema.  No joint effusion. Skin: No rash.  Skin appears warm. Neurologic: Alert awake oriented to time place and person.  Moves all extremities. Psychiatric: Appears normal.  Normal affect.   Labs on Admission: I have personally reviewed following labs and imaging studies  CBC: Recent Labs  Lab 12/08/17 1659  WBC 13.5*  HGB 11.8*  HCT 36.6*  MCV 86.9  PLT 828   Basic Metabolic Panel: Recent Labs  Lab 12/08/17 1659  NA 133*  K 4.0  CL 98  CO2 21*  GLUCOSE 220*  BUN 8  CREATININE 1.17  CALCIUM 9.5   GFR: CrCl cannot be calculated (Unknown ideal weight.). Liver Function Tests: Recent Labs  Lab 12/08/17 1659  AST 42*  ALT 41  ALKPHOS 113  BILITOT 1.5*  PROT 7.2  ALBUMIN 3.4*   Recent Labs  Lab 12/08/17 1659  LIPASE 41   No results for input(s): AMMONIA in the last 168 hours. Coagulation Profile: No results for  input(s): INR, PROTIME in the last 168 hours. Cardiac Enzymes: No results for input(s): CKTOTAL, CKMB, CKMBINDEX, TROPONINI in the last 168 hours. BNP (last 3 results) No results for input(s): PROBNP in the last 8760 hours. HbA1C: No results for input(s): HGBA1C in the last 72 hours. CBG: No results for input(s): GLUCAP in the last 168 hours. Lipid Profile: No results for input(s): CHOL, HDL, LDLCALC, TRIG, CHOLHDL, LDLDIRECT in the last 72 hours. Thyroid Function Tests: No results for input(s): TSH, T4TOTAL, FREET4, T3FREE, THYROIDAB in the last 72 hours. Anemia Panel: No results for input(s): VITAMINB12, FOLATE, FERRITIN, TIBC, IRON, RETICCTPCT in the last 72 hours. Urine analysis:  Component Value Date/Time   COLORURINE YELLOW 12/08/2017 1700   APPEARANCEUR CLEAR 12/08/2017 1700   LABSPEC 1.003 (L) 12/08/2017 1700   PHURINE 6.0 12/08/2017 1700   GLUCOSEU >=500 (A) 12/08/2017 1700   HGBUR SMALL (A) 12/08/2017 1700   BILIRUBINUR NEGATIVE 12/08/2017 1700   KETONESUR NEGATIVE 12/08/2017 1700   PROTEINUR NEGATIVE 12/08/2017 1700   NITRITE NEGATIVE 12/08/2017 1700   LEUKOCYTESUR NEGATIVE 12/08/2017 1700   Sepsis Labs: _0 (procalcitonin:4,lacticidven:4) )No results found for this or any previous visit (from the past 240 hour(s)).   Radiological Exams on Admission: Dg Chest 2 View  Result Date: 12/08/2017 CLINICAL DATA:  Pancreatitis EXAM: CHEST - 2 VIEW COMPARISON:  11/12/2017, CT 11/16/2017 FINDINGS: Low lung volumes with atelectasis at the right base. No consolidation or effusion. Cardiomediastinal silhouette within normal limits. No pneumothorax. IMPRESSION: No active cardiopulmonary disease. Low lung volumes with atelectasis at the right base. Electronically Signed   By: Donavan Foil M.D.   On: 12/08/2017 21:04   Ct Abdomen Pelvis W Contrast  Result Date: 12/08/2017 CLINICAL DATA:  Sepsis and fever.  Recent pancreatitis. EXAM: CT ABDOMEN AND PELVIS WITH CONTRAST  TECHNIQUE: Multidetector CT imaging of the abdomen and pelvis was performed using the standard protocol following bolus administration of intravenous contrast. CONTRAST:  139m ISOVUE-300 IOPAMIDOL (ISOVUE-300) INJECTION 61% COMPARISON:  Most recent CT 11/16/2017 FINDINGS: Lower chest: Resolved left pleural effusion with mild residual atelectasis. Minimal right lung base atelectasis. Small paraesophageal node, unchanged. Hepatobiliary: No focal liver abnormality is seen. No gallstones, gallbladder wall thickening, or biliary dilatation. Pancreas: Again seen sequela of necrotizing pancreatitis with near complete fluid replacement of pancreatic parenchyma. No internal air. Scattered irregular foci of soft tissue attenuation within the pancreas for example image 31 and 35 series 3 likely represent residual pancreatic parenchyma. Surrounding peripancreatic inflammatory stranding persists but is improved in the interim. The previous lesser sac fluid collection now appears contiguous with the necrotic pancreatic parenchyma, and is not separately distinguished. Small amount of fluid insinuates superiorly in the lesser sac image 28 series 3 with slight increase from prior exam. Fluid in the right pericolic gutter has diminished but appears organizing and is lobular in appearance. No discrete extra pancreatic fluid collection. Spleen: Normal in size. Fluid or pace pancreatic tail abuts the splenic hilum. The splenic vein slightly narrowed but remains patent. Adrenals/Urinary Tract: Post left nephrectomy. Compensatory hypertrophy of the right kidney. No right hydronephrosis. Urinary bladder is physiologically distended without wall thickening. Stomach/Bowel: Bowel evaluation is limited in the absence of enteric contrast. Mild gastric wall enhancement and wall thickening likely reactive due to adjacent pancreatic inflammation. No evidence of bowel obstruction. Borderline wall thickening of the ascending colon is likely  reactive. Air-filled appendix is prominent diameter proximally but without periappendiceal inflammation. Vascular/Lymphatic: Splenic vein, portal vein, and mesenteric vessels are patent. Normal caliber abdominal aorta. Multiple small retroperitoneal nodes are likely reactive and not enlarged by size criteria. Reproductive: Prostate is unremarkable. Other: No free air. Persistent soft tissue density in the right pericolic gutter with decreased fluid but increasing lobular soft tissue components. Musculoskeletal: Bone islands in the pelvis and proximal femora. There are no acute or suspicious osseous abnormalities. IMPRESSION: 1. Severe necrotizing pancreatitis. Persistent but slight decreased peripancreatic inflammation since CT 3 weeks ago. The previous lesser sac fluid collection is now contiguous with the necrotic pancreatic parenchyma and not separately distinguished. Slight increased fluid in the lesser sac but no new organizing fluid collection. 2. Decreasing fluid in the right pericolic gutter with residual  soft tissue nodular densities, no internal air to suggest superimposed infection. 3. Persistent wall thickening of the ascending colon, likely reactive. 4. Resolved left pleural effusion. Electronically Signed   By: Keith Rake M.D.   On: 12/08/2017 23:11      Assessment/Plan Principal Problem:   SIRS (systemic inflammatory response syndrome) (HCC) Active Problems:   Essential hypertension   Necrotizing pancreatitis   Hyperglycemia    1. SIRS with likely developing sepsis source could be intra-abdominal with patient had a necrotizing pancreatitis.  Chest x-ray and UA were unremarkable.  Blood cultures have been ordered.  Follow lactate procalcitonin levels.  Continue with aggressive hydration and closely noted that patient during last admission did go into fluid overload.  Patient has been placed on empiric antibiotics meropenem and vancomycin.  Consult infectious disease and  gastroenterologist in the morning.  I did discuss with on-call surgeon Dr. Georgette Dover. 2. Necrotizing pancreatitis -CAT scan shows shows inflammatory changes.  Patient denies any abdominal pain denies vomiting or diarrhea or any pain on eating. 3. Hypertension on amlodipine and metoprolol. 4. Hyperglycemia last hemoglobin A1c last month was 5.5.  For now we will keep patient on sliding scale coverage and closely follow CBGs and metabolic panel.  May need to restart long-acting insulin. 5. History of nephrectomy for left-sided renal cell carcinoma in 2012.   DVT prophylaxis: SCDs in anticipation of procedure. Code Status: Full code. Family Communication: Discussed with patient. Disposition Plan: Home. Consults called: None.  Discussed with surgery. Admission status: Inpatient.   Rise Patience MD [facility] Pager [phone].  If 7PM-7AM, please contact night-coverage [email or web address] Password [facility]  12/09/2017, 2:49 AM

## 2017-12-09 NOTE — Progress Notes (Signed)
[facility] infusion Coordinator will follow with ID team to support home infusion services if needed at DC.  [facility] has provided for pt following his last hospital admission.  If patient discharges after hours, please call [phone].   Larry Sierras 12/09/2017, 1:53 PM

## 2017-12-09 NOTE — Consult Note (Addendum)
[facility] Consult: 9:23 AM 12/09/2017  LOS: 1 day    Referring Provider: Dr Tyrell Antonio  Primary Care Physician:  Karleen Hampshire., MD Primary Gastroenterologist:  Dr. Ammie Ferrier >>  [place].      Reason for Consultation:  Necrotizing pancreatitis.     HPI: Paul [name] is a 37 y.o. male. Hx Htn.  HLD.  Renal cell cancer, left nephrectomy ~ 2012.  IDDM since diagnosis of acute pancreatitis 10/2017.  GERD, S/p EGD 02/2017, normal.    Admitted 8/6 - 11/18/17 with acute, necrotizing pancreatitis.  Etiology not determined: denied ETOH, + GB sludge but normal CBD on ultrasound, normal trigs, normal IgG4. Treated with Meropenem.  Discharged to complete 3 weeks total abx via PICC, stop date 9/3 of Ertrapenem.  Lipase >4842... 67,  AST > ALT to 92/67, T bili to 4.1.  Left pleural effusion, fluid overload.  Glucoses into 200s.  AKI resolved.  Ultrasound showed fatty liver, patent PV, 3.4 mm CBD.  Small GB sludge.   Last CT 8/19 (#3): Severe, diffuse necrotizing pancreatitis without significant interval change. Lesser sac peripancreatic fluid collection slightly decreased in size but with increased wall thickening. Right >> left retroperitoneal inflammatory changes slightly decreased.  Small, stable dependent left pleural effusion. Mild, stable wall thickening in the right colon and splenic flexure, probably reactive.     Completed ABX on 9/3, PICC line removed 9/4.  Doing well. eating well.  Sugars well controlled.  BM's alternating soft to hard stool. No abdominal pain but pain ceased and never recurred within 3 days of first admission.  Later day on 9/9 developed fevers, up to 103, chills, and mild nausea. Bilious emesis in ED, resolved.  Temp to  102.7 inpt.  Headache, sinus congestion, cough.  Started on IV Vanc and  Meropenem.       CT scan, his 4th since 8/7: only slight decrease in severe necrotizing pancreatitis.  Lesser sac fluid collection now indistinguishable from necrotic area.  Decreased fluid in right pericolic gutter.  Likely reactive, persistent thickening of ascending colon.  Left pleural effusion resolved.   Lipase 41.   AST/ALT 42/41.  T bili 2.  Alk phos 113.  Alb 2.9.   Lactic acid 2.5 >> 1.1.    WBC 13.5 >> 7.3.    Denies ETOH.     Past Medical History:  Diagnosis Date  . Cancer of kidney [facility]) 2011   "left"  . Elevated blood uric acid level    "I take RX for it; Allopurinol" (11/04/2017)  . GERD (gastroesophageal reflux disease)   . High cholesterol   . Hypertension   . Migraine    "only in my teens" (11/04/2017)  . Pancreatitis     Past Surgical History:  Procedure Laterality Date  . NEPHRECTOMY Left 2011   "kidney cancer"  . UPPER GASTROINTESTINAL ENDOSCOPY  02/2017    Prior to Admission medications   Medication Sig Start Date End Date Taking? Authorizing Provider  allopurinol (ZYLOPRIM) 100 MG tablet Take 100 mg by mouth 2 (two) times daily. 10/07/17  Yes [provider]  amLODipine (NORVASC) 2.5 MG tablet Take 2.5 mg by mouth at bedtime.  10/07/17  Yes [provider]  atenolol (TENORMIN) 50 MG tablet Take 50 mg by mouth daily. 10/27/17  Yes [provider]  ibuprofen (ADVIL,MOTRIN) 200 MG tablet Take 200 mg by mouth every 6 (six) hours as needed for fever.   Yes [provider]  insulin aspart (NOVOLOG) 100 UNIT/ML FlexPen Before each meal 3 times a day, 140-199 - 2 units, 200-250 - 4 units, 251-299 - 6 units,  300-349 - 8 units,  350 or above 10 units. Insulin PEN if approved, provide syringes and needles if needed. Patient taking differently: Inject 0-10 Units into the skin 3 (three) times daily before meals. Per sliding scale: 140-199 - 2 units, 200-250 - 4 units, 251-299 - 6 units,  300-349 - 8 units,  350 or above 10 units. 11/18/17   Yes Florencia Reasons, MD  insulin glargine (LANTUS) 100 unit/mL SOPN Inject 0.15 mLs (15 Units total) into the skin at bedtime. Patient taking differently: Inject 15 Units into the skin at bedtime. Do not take if bs is 80 or lower 11/18/17  Yes Florencia Reasons, MD  vitamin B-12 (CYANOCOBALAMIN) 1000 MCG tablet Take 1,000 mcg by mouth daily.   Yes [provider]  Amino Acids-Protein Hydrolys (FEEDING SUPPLEMENT, PRO-STAT SUGAR FREE 64,) LIQD Take 30 mLs by mouth 3 (three) times daily between meals. Patient not taking: Reported on 12/08/2017 11/18/17   Florencia Reasons, MD  blood glucose meter kit and supplies Dispense based on patient and insurance preference. Use up to four times daily as directed. (FOR ICD-10 E10.9, E11.9). 11/18/17   Florencia Reasons, MD  Insulin Pen Needle 31G X 5 MM MISC For insulin injection, please provide a month supply. 11/18/17   Florencia Reasons, MD  LEVEMIR FLEXTOUCH 100 UNIT/ML Pen Inject 15 Units into the skin at bedtime. 11/27/17   [provider]    Scheduled Meds: . allopurinol  100 mg Oral BID  . amLODipine  2.5 mg Oral QHS  . atenolol  50 mg Oral Daily  . insulin aspart  0-9 Units Subcutaneous TID WC  . iopamidol      . vitamin B-12  1,000 mcg Oral Daily   Infusions: . sodium chloride    . meropenem (MERREM) IV 1 g (12/09/17 0537)  . vancomycin     PRN Meds: acetaminophen **OR** acetaminophen, ondansetron **OR** ondansetron (ZOFRAN) IV   Allergies as of 12/08/2017 - Review Complete 12/08/2017  Allergen Reaction Noted  . Honey Diarrhea and Nausea And Vomiting 12/08/2017    History reviewed. No pertinent family history.  Social History   Socioeconomic History  . Marital status: Married    Spouse name: Not on file  . Number of children: Not on file  . Years of education: Not on file  . Highest education level: Not on file  Occupational History  . Not on file  Social Needs  . Financial resource strain: Not on file  . Food insecurity:    Worry: Not on file     Inability: Not on file  . Transportation needs:    Medical: Not on file    Non-medical: Not on file  Tobacco Use  . Smoking status: Former Smoker    Packs/day: 0.50    Years: 11.00    Pack years: 5.50    Types: Cigarettes    Last attempt to quit: 2011    Years since quitting: 8.6  .  Smokeless tobacco: Never Used  Substance and Sexual Activity  . Alcohol use: Yes    Alcohol/week: 4.0 standard drinks    Types: 4 Cans of beer per week  . Drug use: Never  . Sexual activity: Yes  Lifestyle  . Physical activity:    Days per week: Not on file    Minutes per session: Not on file  . Stress: Not on file  Relationships  . Social connections:    Talks on phone: Not on file    Gets together: Not on file    Attends religious service: Not on file    Active member of club or organization: Not on file    Attends meetings of clubs or organizations: Not on file    Relationship status: Not on file  . Intimate partner violence:    Fear of current or ex partner: Not on file    Emotionally abused: Not on file    Physically abused: Not on file    Forced sexual activity: Not on file  Other Topics Concern  . Not on file  Social History Narrative  . Not on file    REVIEW OF SYSTEMS: Constitutional:  No weakness or fatigue ENT:  No nose bleeds Pulm:  No SOB.  Mild cough and sinus congestion.   CV:  Tachy but no palpitations, no LE edema.  GU:  No hematuria, no frequency, no hesitancy.   GI:  Per HPI Heme:  No unusual bleeding or bruising.     Transfusions:  None ever.   Neuro:  Mild headache, no peripheral tingling or numbness. Derm:  No itching, no rash or sores.  Endocrine:  Home sugars 70s to 120s.  Had not used Lantus or SSI  for ~ 4 days PTA.  No polyuria or dysuria Immunization:  Not queried.   Travel:  None beyond local counties in last few months.    PHYSICAL EXAM: Vital signs in last 24 hours: Vitals:   12/09/17 0547 12/09/17 0749  BP:  117/76  Pulse:  (!) 123  Resp:   (!) 26  Temp: (!) 102.2 F (39 C) 99.9 F (37.7 C)  SpO2:  97%   Wt Readings from Last 3 Encounters:  12/09/17 77.1 kg  11/17/17 84.3 kg    General: pleasant, does not look ill.   Head:  No signs of trauma, no asymmetry or swelling.    Eyes:  No icterus or conj pallor.   Ears:  Not HOH.    Nose:  No discharge or congestion.   Mouth:  Moist and clear oral MM.   Neck:  No mass, no JVD, no TMG.   Lungs:  Clear bil.  No cough or SOB.   Heart: tachy, regular.   Abdomen:  Soft, NT, ND.  No mass or HSM.   Active BS.     Rectal: deferred.     Musc/Skeltl: no joint swelling, redness, deformity.   Extremities:  No CCE  Neurologic:  Oriented x 3.  Fully alert.  No weakness, tremors, deficits Skin:  No rash, sores.   Tattoos:  none Nodes:  No cervical adenopathy.     Psych:  Pleasant, cooperative.  Good historian.    Intake/Output from previous day: 09/10 0701 - 09/11 0700 In: 2150.8 [IV Piggyback:2150.8] Out: 350 [Urine:350] Intake/Output this shift: No intake/output data recorded.  LAB RESULTS: Recent Labs    12/08/17 1659 12/09/17 0313  WBC 13.5* 7.3  HGB 11.8* 10.6*  HCT 36.6* 33.1*  PLT 235  162   BMET Lab Results  Component Value Date   NA 136 12/09/2017   NA 133 (L) 12/08/2017   NA 129 (L) 11/24/2017   K 3.6 12/09/2017   K 4.0 12/08/2017   K 4.1 11/24/2017   CL 105 12/09/2017   CL 98 12/08/2017   CL 96 11/24/2017   CO2 20 (L) 12/09/2017   CO2 21 (L) 12/08/2017   CO2 25 11/24/2017   GLUCOSE 167 (H) 12/09/2017   GLUCOSE 220 (H) 12/08/2017   GLUCOSE 314 (H) 11/24/2017   BUN 8 12/09/2017   BUN 8 12/08/2017   BUN 18 11/24/2017   CREATININE 1.17 12/09/2017   CREATININE 1.17 12/08/2017   CREATININE 1.02 11/24/2017   CALCIUM 8.5 (L) 12/09/2017   CALCIUM 9.5 12/08/2017   CALCIUM 9.6 11/24/2017   LFT Recent Labs    12/08/17 1659 12/09/17 0313  PROT 7.2 6.1*  ALBUMIN 3.4* 2.9*  AST 42* 34  ALT 41 38  ALKPHOS 113 97  BILITOT 1.5* 2.0*  BILIDIR  --   0.7*  IBILI  --  1.3*   PT/INR Lab Results  Component Value Date   INR 1.37 12/09/2017   INR 1.2 (H) 11/24/2017   INR 1.07 11/17/2017   Hepatitis Panel No results for input(s): HEPBSAG, HCVAB, HEPAIGM, HEPBIGM in the last 72 hours. C-Diff No components found for: CDIFF Lipase     Component Value Date/Time   LIPASE 41 12/08/2017 1659    Drugs of Abuse  No results found for: LABOPIA, COCAINSCRNUR, LABBENZ, AMPHETMU, THCU, LABBARB   RADIOLOGY STUDIES: Dg Chest 2 View  Result Date: 12/08/2017 CLINICAL DATA:  Pancreatitis EXAM: CHEST - 2 VIEW COMPARISON:  11/12/2017, CT 11/16/2017 FINDINGS: Low lung volumes with atelectasis at the right base. No consolidation or effusion. Cardiomediastinal silhouette within normal limits. No pneumothorax. IMPRESSION: No active cardiopulmonary disease. Low lung volumes with atelectasis at the right base. Electronically Signed   By: Donavan Foil M.D.   On: 12/08/2017 21:04   Ct Abdomen Pelvis W Contrast  Result Date: 12/08/2017 CLINICAL DATA:  Sepsis and fever.  Recent pancreatitis. EXAM: CT ABDOMEN AND PELVIS WITH CONTRAST TECHNIQUE: Multidetector CT imaging of the abdomen and pelvis was performed using the standard protocol following bolus administration of intravenous contrast. CONTRAST:  18m ISOVUE-300 IOPAMIDOL (ISOVUE-300) INJECTION 61% COMPARISON:  Most recent CT 11/16/2017 FINDINGS: Lower chest: Resolved left pleural effusion with mild residual atelectasis. Minimal right lung base atelectasis. Small paraesophageal node, unchanged. Hepatobiliary: No focal liver abnormality is seen. No gallstones, gallbladder wall thickening, or biliary dilatation. Pancreas: Again seen sequela of necrotizing pancreatitis with near complete fluid replacement of pancreatic parenchyma. No internal air. Scattered irregular foci of soft tissue attenuation within the pancreas for example image 31 and 35 series 3 likely represent residual pancreatic parenchyma. Surrounding  peripancreatic inflammatory stranding persists but is improved in the interim. The previous lesser sac fluid collection now appears contiguous with the necrotic pancreatic parenchyma, and is not separately distinguished. Small amount of fluid insinuates superiorly in the lesser sac image 28 series 3 with slight increase from prior exam. Fluid in the right pericolic gutter has diminished but appears organizing and is lobular in appearance. No discrete extra pancreatic fluid collection. Spleen: Normal in size. Fluid or pace pancreatic tail abuts the splenic hilum. The splenic vein slightly narrowed but remains patent. Adrenals/Urinary Tract: Post left nephrectomy. Compensatory hypertrophy of the right kidney. No right hydronephrosis. Urinary bladder is physiologically distended without wall thickening. Stomach/Bowel: Bowel evaluation is limited  in the absence of enteric contrast. Mild gastric wall enhancement and wall thickening likely reactive due to adjacent pancreatic inflammation. No evidence of bowel obstruction. Borderline wall thickening of the ascending colon is likely reactive. Air-filled appendix is prominent diameter proximally but without periappendiceal inflammation. Vascular/Lymphatic: Splenic vein, portal vein, and mesenteric vessels are patent. Normal caliber abdominal aorta. Multiple small retroperitoneal nodes are likely reactive and not enlarged by size criteria. Reproductive: Prostate is unremarkable. Other: No free air. Persistent soft tissue density in the right pericolic gutter with decreased fluid but increasing lobular soft tissue components. Musculoskeletal: Bone islands in the pelvis and proximal femora. There are no acute or suspicious osseous abnormalities. IMPRESSION: 1. Severe necrotizing pancreatitis. Persistent but slight decreased peripancreatic inflammation since CT 3 weeks ago. The previous lesser sac fluid collection is now contiguous with the necrotic pancreatic parenchyma and not  separately distinguished. Slight increased fluid in the lesser sac but no new organizing fluid collection. 2. Decreasing fluid in the right pericolic gutter with residual soft tissue nodular densities, no internal air to suggest superimposed infection. 3. Persistent wall thickening of the ascending colon, likely reactive. 4. Resolved left pleural effusion. Electronically Signed   By: Keith Rake M.D.   On: 12/08/2017 23:11     IMPRESSION:   *   Necrotizing pancreatitis.  Completed 3 weeks abx: Meropenem.Avelino Leeds.    Now with recurrent fevers, leukocytosis.   Etiology of pancreatitis indeterminate, though did have GB sludge on 11/10/17 ultrasound.  On Vanc and Meropenem.    *   Sepsis criteria met with fevers, leukocytosis, lactic acidosis.  Parameters improved.       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   No AKI.    *    IDDM, new dx as of 10/2017.  Sugars well controlled at home.     *   MRSA + nasal swab last admission, but retest yesterday was negative.  No need of contact precautions.      PLAN:     *   Allow carb mod diet.    *  Stopped Vancomycin, will continue Meropenem.      *  Stopped contact precautions.     Azucena Freed  12/09/2017, 9:23 AM Phone [phone]   Attending physician's note   I have taken an interval history, reviewed the chart and examined the patient. I agree with the Advanced Practitioner's note, impression, and recommendations as outlined. 37 yo male with recent admission Aug 6-21 for idiopathic acute pancreatitis c/b necrotizing pancreatitis requiring 4 week course of IV Abx, completing Ertapenem on 12/02/17. He was otherwise doing well, tolerating PO intake and back to daily activities, then developed fever earlier this week at home. He does endorse some sinus congestion, but o/w no abdominal pain, n/v, change in bowel habits, and no SOB, cough, or GU sxs. On presentation to the ER last evening, was febrile to 102, tachycardic  (112) and leukocytosis (13.5), admitted with Vanco/Meropenem. Blood cultures pending, but o/w normal UA and CXR without acute CP disease. Had a mild lactic acidosis (2.56) which rapidly corrected and procalcitonin 1.29. TBili 2.0, with indirect 0.7. He has since defervesced and rapid normalization of WBC to 7.3 this AM. Repeat CT reviewed by me and again demonstrating necrotizing pancreatitis, but a more organized collection with thickened wall that may be amenable to drainage. No gas/air collections. Incidental inflammatory changes noted about stomach and colon.   This is certainly a complex presentation in a  patient with recent admission for nec pancreatitis who had been doing with with IV Abx, but unfortunately with repeat fevers within just a few days of Abx completion, presenting with 3/4 SIRS cirteria, certainly concerning for active infection. He has had a swift clinical response to Abx and IVFs. Alternate etiology includes URI/sinus given c/o recent congestion.   - Resume carbapenem and IVFs  - Blood cultures pending - Will discuss further with Interventional Biliary Endoscopist re: cyst gastrostomy vs percutaneous drainage vs continued watchful waiting. As he has had a swift clinical response, ok to complete remainder of eval for etiology of fever and monitor for response to therapy for now, pending optimal timing of drainage of large pseudocyst - Agree with ID consult - Ok to resume PO for now, and make NPO at [facility] for possible procedure tomorrow pending eval as above - Will continue to follow closely   Gerrit Heck, DO, FACG [phone] office

## 2017-12-09 NOTE — Consult Note (Signed)
Date of Admission:  12/08/2017          Reason for Consult: fever, necrotizing pancreatis with ? abscess   Referring Provider: Dr. Tyrell Antonio   Assessment:  1. Necrotizing pancreatitis of unknown cause 2. Possible pancreatic abscess sp 3 weeks of meropenem 3. Fevers, chills, SIRS likely due to #1 and #2  Plan:  1. OK to continue merrem for now but I worry about emergence of CRE 2. PLEASE sample the abscess/fluid collection to help guide therapy 3. Ensure screen for Hep B, C and HIV  Principal Problem:   SIRS (systemic inflammatory response syndrome) (HCC) Active Problems:   Essential hypertension   Necrotizing pancreatitis   Hyperglycemia   Scheduled Meds: . allopurinol  100 mg Oral BID  . atenolol  50 mg Oral Daily  . insulin aspart  0-9 Units Subcutaneous TID WC  . vitamin B-12  1,000 mcg Oral Daily   Continuous Infusions: . sodium chloride 125 mL/hr at 12/09/17 1135  . meropenem (MERREM) IV 1 g (12/09/17 0537)   PRN Meds:.acetaminophen **OR** acetaminophen, ibuprofen, ondansetron **OR** ondansetron (ZOFRAN) IV  HPI: Paul [name] is a 37 y.o. male with history of renal cell carcinoma status post nephrectomy in 2012 who was admitted for idiopathic pancreatitis last month.  He had necrotizing component as well as a fluid collection that might of been an abscess.  He was discharged to home with IV meropenem and by his account received at least 3 weeks of antibiotics include time spent in the hospital.  Last dose of antibiotics on September 4.  He then presents now on September 10 with fevers chills malaise.  He has not had nausea vomiting abdominal pain cough or other systemic or focal symptoms.  In the ER he was febrile to 102 and with tachycardia leukocytosis CT of the abdomen and pelvis showed that his necrotizing pancreatitis had not changed much.  The fluid collection/polyp possible abscess apparently is a bit more organized now.  Blood cultures were taken and he was  placed on vancomycin and meropenem.  He has improved and we are now consulted with regards to recommendations for further work-up and antimicrobial management.  Note there is no evidence now that treating necrotizing pancreatitis warrants institution of antimicrobial therapy such as CARBAPENEM's.  I worry about this use of CARBAPENEM is being perpetuated potentially leading to CARBAPENEM is resistant organisms which are very difficult and expensive to treat.  Hopefully we can get a culture from this fluid collection to help give more targeted therapy as the fluid collection,/abscess is the only thing that would be in need of antimicrobial therapy.  Dr. Baxter Flattery will be covering for me tomorrow and Friday.  We will not have much to offer until and unless fluid is sampled for culture.     Review of Systems: Review of Systems  Constitutional: Positive for chills, diaphoresis, fever and malaise/fatigue. Negative for weight loss.  HENT: Negative for congestion, hearing loss, sore throat and tinnitus.   Eyes: Negative for blurred vision and double vision.  Respiratory: Negative for cough, sputum production, shortness of breath and wheezing.   Cardiovascular: Negative for chest pain, palpitations and leg swelling.  Gastrointestinal: Negative for abdominal pain, blood in stool, constipation, diarrhea, heartburn, melena, nausea and vomiting.  Genitourinary: Negative for dysuria, flank pain and hematuria.  Musculoskeletal: Negative for back pain, falls, joint pain and myalgias.  Skin: Negative for itching and rash.  Neurological: Negative for dizziness, sensory change, focal weakness, loss of consciousness,  weakness and headaches.  Endo/Heme/Allergies: Does not bruise/bleed easily.  Psychiatric/Behavioral: Negative for depression, memory loss and suicidal ideas. The patient is not nervous/anxious.     Past Medical History:  Diagnosis Date  . Cancer of kidney [facility]) 2011   "left"  . Elevated  blood uric acid level    "I take RX for it; Allopurinol" (11/04/2017)  . GERD (gastroesophageal reflux disease)   . High cholesterol   . Hypertension   . Migraine    "only in my teens" (11/04/2017)  . Pancreatitis     Social History   Tobacco Use  . Smoking status: Former Smoker    Packs/day: 0.50    Years: 11.00    Pack years: 5.50    Types: Cigarettes    Last attempt to quit: 2011    Years since quitting: 8.6  . Smokeless tobacco: Never Used  Substance Use Topics  . Alcohol use: Yes    Alcohol/week: 4.0 standard drinks    Types: 4 Cans of beer per week  . Drug use: Never    History reviewed. No pertinent family history. Allergies  Allergen Reactions  . Honey Diarrhea and Nausea And Vomiting    OBJECTIVE: Blood pressure 117/76, pulse (!) 123, temperature 99.9 F (37.7 C), temperature source Oral, resp. rate (!) 26, height 5\' 10"  (1.778 m), weight 77.1 kg, SpO2 97 %.  Physical Exam  Constitutional: He is oriented to person, place, and time. He appears well-developed and well-nourished.  HENT:  Head: Normocephalic and atraumatic.  Eyes: Conjunctivae and EOM are normal.  Neck: Normal range of motion. Neck supple.  Cardiovascular: Normal rate, regular rhythm and normal heart sounds. Exam reveals no gallop and no friction rub.  No murmur heard. Pulmonary/Chest: Effort normal and breath sounds normal. No respiratory distress. He has no wheezes.  Abdominal: Soft. Bowel sounds are normal. He exhibits no distension and no mass. There is no tenderness. There is no guarding.  Musculoskeletal: Normal range of motion. He exhibits no edema or tenderness.  Neurological: He is alert and oriented to person, place, and time.  Skin: Skin is warm and dry. No rash noted. No erythema. No pallor.    Lab Results Lab Results  Component Value Date   WBC 7.3 12/09/2017   HGB 10.6 (L) 12/09/2017   HCT 33.1 (L) 12/09/2017   MCV 85.8 12/09/2017   PLT 162 12/09/2017    Lab Results    Component Value Date   CREATININE 1.17 12/09/2017   BUN 8 12/09/2017   NA 136 12/09/2017   K 3.6 12/09/2017   CL 105 12/09/2017   CO2 20 (L) 12/09/2017    Lab Results  Component Value Date   ALT 38 12/09/2017   AST 34 12/09/2017   ALKPHOS 97 12/09/2017   BILITOT 2.0 (H) 12/09/2017     Microbiology: Recent Results (from the past 240 hour(s))  MRSA PCR Screening     Status: None   Collection Time: 12/09/17  5:45 AM  Result Value Ref Range Status   MRSA by PCR NEGATIVE NEGATIVE Final    Comment:        The GeneXpert MRSA Assay (FDA approved for NASAL specimens only), is one component of a comprehensive MRSA colonization surveillance program. It is not intended to diagnose MRSA infection nor to guide or monitor treatment for MRSA infections. Performed at [facility], [place] [address] [place], [place] [address]     Alcide Evener, [place] [facility] Infectious Disease [facility] [phone] pager  12/09/2017, 3:24 PM

## 2017-12-09 NOTE — Progress Notes (Signed)
PROGRESS NOTE    Paul [name]  FYB:[id number] DOB: 1980/07/11 DOA: 12/08/2017 PCP: Karleen Hampshire., MD    Brief Narrative:Paul [name] is a 37 y.o. male with history of renal cell carcinoma status post left-sided nephrectomy in 2012 who was admitted for idiopathic pancreatitis last month and had necrotizing pancreatitis eventually discharged home on IV antibiotics last dose was on December 02, 2017 last week presents to the ER with complaints of fever and chills for last 2 days.  Denies any nausea vomiting abdominal pain diarrhea able to eat well denies any chest pain productive cough headache visual symptoms.  Patient also was discharged on insulin for hyperglycemia which patient states has improved and has stopped using long-acting insulin but last 2 days his blood sugar started increasing again.  ED Course: In the ER patient had a fever of 102 F chest x-ray and urine were unremarkable.  CT of the abdomen shows features of necrotizing pancreatitis with no definite abscess.  Blood cultures are obtained started on fluid per sepsis protocol and admitted for possible developing sepsis.  Likely source could be intra-abdominal.     Assessment & Plan:   Principal Problem:   SIRS (systemic inflammatory response syndrome) (HCC) Active Problems:   Essential hypertension   Necrotizing pancreatitis   Hyperglycemia  1-Sepsis; presents with fevers, leukocytosis. Source of infection probably necrotizing pancreatitis.  On meropenem and IV vancomycin.  Check x ray, UA clean.  Follow blood culture.  Elevated lactic acid, pro-calcitonin level.   2-Necrotizing pancreatitis;  CT shows inflammatory changes. GI consulted.  Will consult ID.  Continue with meropenem.  Mild elevation of bilirubin. Follow trend.   3-HTN; hold Norvasc. Continue with atenolol.   4-Hyperglycemia; Continue with SSI. Might need long acting.   History of nephrectomy for left-sided renal cell carcinoma in 2012.  DVT  prophylaxis: SCD.  Code Status: full code.  Family Communication: care discussed with patient  Disposition Plan: remain inpatient for IV antibiotics.    Consultants:   GI   ID   Procedures:  \none  Antimicrobials: meropenem.   Subjective: He denies cough, dyspnea.  Denies abdominal pain.  Denies dysuria.   Objective: Vitals:   12/09/17 0017 12/09/17 0302 12/09/17 0320 12/09/17 0547  BP:  131/84    Pulse: (!) 109 (!) 134    Resp: 20 (!) 27    Temp:  (!) 102.4 F (39.1 C) (!) 102.2 F (39 C) (!) 102.2 F (39 C)  TempSrc:  Oral    SpO2: 99% 99%    Weight:  77.1 kg    Height:  5\' 10"  (1.778 m)      Intake/Output Summary (Last 24 hours) at 12/09/2017 0746 Last data filed at 12/09/2017 0618 Gross per 24 hour  Intake 2150.8 ml  Output 350 ml  Net 1800.8 ml   Filed Weights   12/09/17 0302  Weight: 77.1 kg    Examination:  General exam: Appears calm and comfortable  Respiratory system: Clear to auscultation. Respiratory effort normal. Cardiovascular system: S1 & S2 heard, RRR. No JVD, murmurs, rubs, gallops or clicks. No pedal edema. Gastrointestinal system: Abdomen is mildly distended, soft and nontender. No organomegaly or masses felt. Normal bowel sounds heard. Central nervous system: Alert and oriented. No focal neurological deficits. Extremities: Symmetric 5 x 5 power. Skin: No rashes, lesions or ulcers Psychiatry: Judgement and insight appear normal. Mood & affect appropriate.     Data Reviewed: I have personally reviewed following labs and imaging studies  CBC: Recent Labs  Lab 12/08/17 1659 12/09/17 0313  WBC 13.5* 7.3  NEUTROABS  --  4.9  HGB 11.8* 10.6*  HCT 36.6* 33.1*  MCV 86.9 85.8  PLT 235 176   Basic Metabolic Panel: Recent Labs  Lab 12/08/17 1659 12/09/17 0313  NA 133* 136  K 4.0 3.6  CL 98 105  CO2 21* 20*  GLUCOSE 220* 167*  BUN 8 8  CREATININE 1.17 1.17  CALCIUM 9.5 8.5*   GFR: Estimated Creatinine Clearance: 90.1  mL/min (by C-G formula based on SCr of 1.17 mg/dL). Liver Function Tests: Recent Labs  Lab 12/08/17 1659 12/09/17 0313  AST 42* 34  ALT 41 38  ALKPHOS 113 97  BILITOT 1.5* 2.0*  PROT 7.2 6.1*  ALBUMIN 3.4* 2.9*   Recent Labs  Lab 12/08/17 1659  LIPASE 41   No results for input(s): AMMONIA in the last 168 hours. Coagulation Profile: Recent Labs  Lab 12/09/17 0313  INR 1.37   Cardiac Enzymes: No results for input(s): CKTOTAL, CKMB, CKMBINDEX, TROPONINI in the last 168 hours. BNP (last 3 results) No results for input(s): PROBNP in the last 8760 hours. HbA1C: No results for input(s): HGBA1C in the last 72 hours. CBG: No results for input(s): GLUCAP in the last 168 hours. Lipid Profile: No results for input(s): CHOL, HDL, LDLCALC, TRIG, CHOLHDL, LDLDIRECT in the last 72 hours. Thyroid Function Tests: No results for input(s): TSH, T4TOTAL, FREET4, T3FREE, THYROIDAB in the last 72 hours. Anemia Panel: No results for input(s): VITAMINB12, FOLATE, FERRITIN, TIBC, IRON, RETICCTPCT in the last 72 hours. Sepsis Labs: Recent Labs  Lab 12/08/17 1719 12/08/17 2011 12/09/17 0313 12/09/17 0615  PROCALCITON  --   --  1.29  --   LATICACIDVEN 1.96* 2.56* 1.9 1.1    Recent Results (from the past 240 hour(s))  MRSA PCR Screening     Status: None   Collection Time: 12/09/17  5:45 AM  Result Value Ref Range Status   MRSA by PCR NEGATIVE NEGATIVE Final    Comment:        The GeneXpert MRSA Assay (FDA approved for NASAL specimens only), is one component of a comprehensive MRSA colonization surveillance program. It is not intended to diagnose MRSA infection nor to guide or monitor treatment for MRSA infections. Performed at [facility], [place] [address] [place], [place] [address]          Radiology Studies: Dg Chest 2 View  Result Date: 12/08/2017 CLINICAL DATA:  Pancreatitis EXAM: CHEST - 2 VIEW COMPARISON:  11/12/2017, CT 11/16/2017 FINDINGS: Low lung volumes  with atelectasis at the right base. No consolidation or effusion. Cardiomediastinal silhouette within normal limits. No pneumothorax. IMPRESSION: No active cardiopulmonary disease. Low lung volumes with atelectasis at the right base. Electronically Signed   By: Donavan Foil M.D.   On: 12/08/2017 21:04   Ct Abdomen Pelvis W Contrast  Result Date: 12/08/2017 CLINICAL DATA:  Sepsis and fever.  Recent pancreatitis. EXAM: CT ABDOMEN AND PELVIS WITH CONTRAST TECHNIQUE: Multidetector CT imaging of the abdomen and pelvis was performed using the standard protocol following bolus administration of intravenous contrast. CONTRAST:  140mL ISOVUE-300 IOPAMIDOL (ISOVUE-300) INJECTION 61% COMPARISON:  Most recent CT 11/16/2017 FINDINGS: Lower chest: Resolved left pleural effusion with mild residual atelectasis. Minimal right lung base atelectasis. Small paraesophageal node, unchanged. Hepatobiliary: No focal liver abnormality is seen. No gallstones, gallbladder wall thickening, or biliary dilatation. Pancreas: Again seen sequela of necrotizing pancreatitis with near complete fluid replacement of pancreatic parenchyma. No internal air.  Scattered irregular foci of soft tissue attenuation within the pancreas for example image 31 and 35 series 3 likely represent residual pancreatic parenchyma. Surrounding peripancreatic inflammatory stranding persists but is improved in the interim. The previous lesser sac fluid collection now appears contiguous with the necrotic pancreatic parenchyma, and is not separately distinguished. Small amount of fluid insinuates superiorly in the lesser sac image 28 series 3 with slight increase from prior exam. Fluid in the right pericolic gutter has diminished but appears organizing and is lobular in appearance. No discrete extra pancreatic fluid collection. Spleen: Normal in size. Fluid or pace pancreatic tail abuts the splenic hilum. The splenic vein slightly narrowed but remains patent.  Adrenals/Urinary Tract: Post left nephrectomy. Compensatory hypertrophy of the right kidney. No right hydronephrosis. Urinary bladder is physiologically distended without wall thickening. Stomach/Bowel: Bowel evaluation is limited in the absence of enteric contrast. Mild gastric wall enhancement and wall thickening likely reactive due to adjacent pancreatic inflammation. No evidence of bowel obstruction. Borderline wall thickening of the ascending colon is likely reactive. Air-filled appendix is prominent diameter proximally but without periappendiceal inflammation. Vascular/Lymphatic: Splenic vein, portal vein, and mesenteric vessels are patent. Normal caliber abdominal aorta. Multiple small retroperitoneal nodes are likely reactive and not enlarged by size criteria. Reproductive: Prostate is unremarkable. Other: No free air. Persistent soft tissue density in the right pericolic gutter with decreased fluid but increasing lobular soft tissue components. Musculoskeletal: Bone islands in the pelvis and proximal femora. There are no acute or suspicious osseous abnormalities. IMPRESSION: 1. Severe necrotizing pancreatitis. Persistent but slight decreased peripancreatic inflammation since CT 3 weeks ago. The previous lesser sac fluid collection is now contiguous with the necrotic pancreatic parenchyma and not separately distinguished. Slight increased fluid in the lesser sac but no new organizing fluid collection. 2. Decreasing fluid in the right pericolic gutter with residual soft tissue nodular densities, no internal air to suggest superimposed infection. 3. Persistent wall thickening of the ascending colon, likely reactive. 4. Resolved left pleural effusion. Electronically Signed   By: Keith Rake M.D.   On: 12/08/2017 23:11        Scheduled Meds: . allopurinol  100 mg Oral BID  . amLODipine  2.5 mg Oral QHS  . atenolol  50 mg Oral Daily  . insulin aspart  0-9 Units Subcutaneous TID WC  . iopamidol       . vitamin B-12  1,000 mcg Oral Daily   Continuous Infusions: . lactated ringers 200 mL/hr at 12/09/17 0328  . meropenem (MERREM) IV 1 g (12/09/17 0537)  . vancomycin       LOS: 1 day    Time spent: 35 minutes.     Elmarie Shiley, MD [facility] Pager [phone]  If 7PM-7AM, please contact night-coverage [email or web address] Password [facility] 12/09/2017, 7:46 AM

## 2017-12-09 NOTE — Progress Notes (Addendum)
Pharmacy Antibiotic Note  Paul [name] is a 37 y.o. male admitted on 12/08/2017 with severe necrotizing pancreatitis and sepsis.  Pharmacy has been consulted for vancomycin and meropenem dosing.  Plan: Rec'd Zosyn dose in ED. Vancomycin 1500mg  x1 then 750mg  IV every 8 hours.  Goal trough 15-20 mcg/mL. Merrem 1g IV every 8 hours.   Temp (24hrs), Avg:100.9 F (38.3 C), Min:99.3 F (37.4 C), Max:102.7 F (39.3 C)  Recent Labs  Lab 12/08/17 1659 12/08/17 1719 12/08/17 2011  WBC 13.5*  --   --   CREATININE 1.17  --   --   LATICACIDVEN  --  1.96* 2.56*     Allergies  Allergen Reactions  . Honey Diarrhea and Nausea And Vomiting     Thank you for allowing pharmacy to be a part of this patient's care.  Wynona Neat, PharmD, BCPS  12/09/2017 2:52 AM

## 2017-12-10 ENCOUNTER — Telehealth: Payer: Self-pay | Admitting: Physician Assistant

## 2017-12-10 LAB — COMPREHENSIVE METABOLIC PANEL
ALBUMIN: 2.4 g/dL — AB (ref 3.5–5.0)
ALK PHOS: 88 U/L (ref 38–126)
ALT: 31 U/L (ref 0–44)
AST: 26 U/L (ref 15–41)
Anion gap: 9 (ref 5–15)
BUN: 8 mg/dL (ref 6–20)
CALCIUM: 7.9 mg/dL — AB (ref 8.9–10.3)
CHLORIDE: 107 mmol/L (ref 98–111)
CO2: 19 mmol/L — AB (ref 22–32)
CREATININE: 1.1 mg/dL (ref 0.61–1.24)
GFR calc Af Amer: >60 mL/min (ref >60)
GFR calc non Af Amer: >60 mL/min (ref >60)
GLUCOSE: 133 mg/dL — AB (ref 70–99)
Potassium: 3.2 mmol/L — ABNORMAL LOW (ref 3.5–5.1)
SODIUM: 135 mmol/L (ref 135–145)
Total Bilirubin: 1.3 mg/dL — ABNORMAL HIGH (ref 0.3–1.2)
Total Protein: 5.6 g/dL — ABNORMAL LOW (ref 6.5–8.1)

## 2017-12-10 LAB — CBC
HCT: 29 % — ABNORMAL LOW (ref 39.0–52.0)
HEMOGLOBIN: 9.3 g/dL — AB (ref 13.0–17.0)
MCH: 27.5 pg (ref 26.0–34.0)
MCHC: 32.1 g/dL (ref 30.0–36.0)
MCV: 85.8 fL (ref 78.0–100.0)
Platelets: 165 10*3/uL (ref 150–400)
RBC: 3.38 MIL/uL — AB (ref 4.22–5.81)
RDW: 13.1 % (ref 11.5–15.5)
WBC: 5.4 10*3/uL (ref 4.0–10.5)

## 2017-12-10 LAB — GLUCOSE, CAPILLARY
GLUCOSE-CAPILLARY: 186 mg/dL — AB (ref 70–99)
Glucose-Capillary: 124 mg/dL — ABNORMAL HIGH (ref 70–99)
Glucose-Capillary: 136 mg/dL — ABNORMAL HIGH (ref 70–99)
Glucose-Capillary: 162 mg/dL — ABNORMAL HIGH (ref 70–99)

## 2017-12-10 MED ORDER — POTASSIUM CHLORIDE CRYS ER 20 MEQ PO TBCR
20.0000 meq | EXTENDED_RELEASE_TABLET | Freq: Once | ORAL | Status: AC
  Administered 2017-12-10: 20 meq via ORAL
  Filled 2017-12-10: qty 1

## 2017-12-10 MED ORDER — POTASSIUM CHLORIDE 10 MEQ/100ML IV SOLN
10.0000 meq | INTRAVENOUS | Status: AC
  Administered 2017-12-10 (×3): 10 meq via INTRAVENOUS
  Filled 2017-12-10: qty 100

## 2017-12-10 NOTE — Progress Notes (Addendum)
Daily Rounding Note  12/10/2017, 8:19 AM  LOS: 2 days   SUBJECTIVE:   Chief complaint: sweats, nausea.      He is feeling better.  "fever broke" overnight with diaphoresis.  Feeling better and not recurrent episodes.  Tolerating solids.  No difficulty urinating.  BM yesterday was brown.    OBJECTIVE:         Vital signs in last 24 hours:    Temp:  [98.3 F (36.8 C)-100.6 F (38.1 C)] 99.1 F (37.3 C) (09/12 0601) Pulse Rate:  [84-93] 85 (09/12 0601) Resp:  [21-23] 22 (09/12 0601) BP: (117-128)/(81-88) 128/85 (09/12 0601) SpO2:  [95 %-98 %] 95 % (09/12 0601) Last BM Date: 12/09/17 Filed Weights   12/09/17 0302  Weight: 77.1 kg   General: looks well.  Comfortable.     Heart: RRR, not tachy Chest: clear bil.  No labored resps or cough Abdomen: soft, NT, ND.  Active BS.   Extremities: no CCE Neuro/Psych:  Alert.  Oriented x 3.  No tremor or limb weakness.    Intake/Output from previous day: 09/11 0701 - 09/12 0700 In: 4117.8 [P.O.:360; I.V.:1029.9; IV Piggyback:2727.9] Out: -   Intake/Output this shift: No intake/output data recorded.  Lab Results: Recent Labs    12/08/17 1659 12/09/17 0313 12/10/17 0618  WBC 13.5* 7.3 5.4  HGB 11.8* 10.6* 9.3*  HCT 36.6* 33.1* 29.0*  PLT 235 162 165   BMET Recent Labs    12/08/17 1659 12/09/17 0313 12/10/17 0618  NA 133* 136 135  K 4.0 3.6 3.2*  CL 98 105 107  CO2 21* 20* 19*  GLUCOSE 220* 167* 133*  BUN 8 8 8   CREATININE 1.17 1.17 1.10  CALCIUM 9.5 8.5* 7.9*   LFT Recent Labs    12/08/17 1659 12/09/17 0313 12/10/17 0618  PROT 7.2 6.1* 5.6*  ALBUMIN 3.4* 2.9* 2.4*  AST 42* 34 26  ALT 41 38 31  ALKPHOS 113 97 88  BILITOT 1.5* 2.0* 1.3*  BILIDIR  --  0.7*  --   IBILI  --  1.3*  --    PT/INR Recent Labs    12/09/17 0313  LABPROT 16.7*  INR 1.37   Hepatitis Panel No results for input(s): HEPBSAG, HCVAB, HEPAIGM, HEPBIGM in the last 72  hours.  Studies/Results: Dg Chest 2 View  Result Date: 12/08/2017 CLINICAL DATA:  Pancreatitis EXAM: CHEST - 2 VIEW COMPARISON:  11/12/2017, CT 11/16/2017 FINDINGS: Low lung volumes with atelectasis at the right base. No consolidation or effusion. Cardiomediastinal silhouette within normal limits. No pneumothorax. IMPRESSION: No active cardiopulmonary disease. Low lung volumes with atelectasis at the right base. Electronically Signed   By: Donavan Foil M.D.   On: 12/08/2017 21:04   Ct Abdomen Pelvis W Contrast  Result Date: 12/08/2017 CLINICAL DATA:  Sepsis and fever.  Recent pancreatitis. EXAM: CT ABDOMEN AND PELVIS WITH CONTRAST TECHNIQUE: Multidetector CT imaging of the abdomen and pelvis was performed using the standard protocol following bolus administration of intravenous contrast. CONTRAST:  147mL ISOVUE-300 IOPAMIDOL (ISOVUE-300) INJECTION 61% COMPARISON:  Most recent CT 11/16/2017 FINDINGS: Lower chest: Resolved left pleural effusion with mild residual atelectasis. Minimal right lung base atelectasis. Small paraesophageal node, unchanged. Hepatobiliary: No focal liver abnormality is seen. No gallstones, gallbladder wall thickening, or biliary dilatation. Pancreas: Again seen sequela of necrotizing pancreatitis with near complete fluid replacement of pancreatic parenchyma. No internal air. Scattered irregular foci of soft tissue attenuation within the pancreas for  example image 31 and 35 series 3 likely represent residual pancreatic parenchyma. Surrounding peripancreatic inflammatory stranding persists but is improved in the interim. The previous lesser sac fluid collection now appears contiguous with the necrotic pancreatic parenchyma, and is not separately distinguished. Small amount of fluid insinuates superiorly in the lesser sac image 28 series 3 with slight increase from prior exam. Fluid in the right pericolic gutter has diminished but appears organizing and is lobular in appearance. No  discrete extra pancreatic fluid collection. Spleen: Normal in size. Fluid or pace pancreatic tail abuts the splenic hilum. The splenic vein slightly narrowed but remains patent. Adrenals/Urinary Tract: Post left nephrectomy. Compensatory hypertrophy of the right kidney. No right hydronephrosis. Urinary bladder is physiologically distended without wall thickening. Stomach/Bowel: Bowel evaluation is limited in the absence of enteric contrast. Mild gastric wall enhancement and wall thickening likely reactive due to adjacent pancreatic inflammation. No evidence of bowel obstruction. Borderline wall thickening of the ascending colon is likely reactive. Air-filled appendix is prominent diameter proximally but without periappendiceal inflammation. Vascular/Lymphatic: Splenic vein, portal vein, and mesenteric vessels are patent. Normal caliber abdominal aorta. Multiple small retroperitoneal nodes are likely reactive and not enlarged by size criteria. Reproductive: Prostate is unremarkable. Other: No free air. Persistent soft tissue density in the right pericolic gutter with decreased fluid but increasing lobular soft tissue components. Musculoskeletal: Bone islands in the pelvis and proximal femora. There are no acute or suspicious osseous abnormalities. IMPRESSION: 1. Severe necrotizing pancreatitis. Persistent but slight decreased peripancreatic inflammation since CT 3 weeks ago. The previous lesser sac fluid collection is now contiguous with the necrotic pancreatic parenchyma and not separately distinguished. Slight increased fluid in the lesser sac but no new organizing fluid collection. 2. Decreasing fluid in the right pericolic gutter with residual soft tissue nodular densities, no internal air to suggest superimposed infection. 3. Persistent wall thickening of the ascending colon, likely reactive. 4. Resolved left pleural effusion. Electronically Signed   By: Keith Rake M.D.   On: 12/08/2017 23:11     Scheduled Meds: . allopurinol  100 mg Oral BID  . atenolol  50 mg Oral Daily  . insulin aspart  0-9 Units Subcutaneous TID WC  . vitamin B-12  1,000 mcg Oral Daily   Continuous Infusions: . sodium chloride Stopped (12/09/17 2200)  . meropenem (MERREM) IV 1 g (12/10/17 0600)  . potassium chloride     PRN Meds:.acetaminophen **OR** acetaminophen, ibuprofen, ondansetron **OR** ondansetron (ZOFRAN) IV   ASSESMENT:   *   Fevers, leukocytosis and meeting sepsis criteria in pt with necrotizing pancreatitis.  Completed 3 weeks abx: Meropenem >> Ertrapenemon 9/3.     Etiology of pancreatitis indeterminate, though did have GB sludge on 11/10/17 ultrasound.  Day 2 new course Meropenem.  Temp curve improved.  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   No AKI.   *   Hypokalemia.    *   Normocytic anemia.   Hgb down ~ 2.5 gm from previous, end of admission, level of 8/27.  No worries as to hemorrhagic pancreatitis on latest CT.    *    IDDM, new dx as of 10/2017.  Sugars well controlled at home.     *   Hep B, C, HIV screening negative  8/7 and 8/16.     PLAN   *  Dr Rush Landmark planning endoscopic cyst gastrostomy tomorrow if [facility] stent available, if not then may ship  to [facility] for procedure (day visit more likely than formal transfer with admission afterwards .     *    Allow po since no plans today for endoscopic or other interventions requiring NPO.  Will order NPO post MN.   Orders placed for EUS with stent placement, in case stent arrives.    *   Continue Meropenem for now.     *   Hospitalist to address hypokalemia.    *  CBC and BMET in AM.    *  Discontinue tele.      Azucena Freed  12/10/2017, 8:19 AM Phone [phone]   Attending physician's note   I have taken an interval history, reviewed the chart and examined the patient. I agree with the Advanced Practitioner's note, impression,  and recommendations as outlined.  Overall, he is feeling better today with decreased fever curve and improved hemodynamics.  Tolerating p.o. intake and no complaint of nausea, vomiting, abdominal pain, change in bowel habits.   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  Currently stable with meropenem, but agree that he needs sampling and source control for durable treatment.  Blood cultures with no growth to date.  - Resume meropenem - Blood cultures with no growth to date.  Will continue to follow for cx results and s/s - Change to full liquid diet for remainder of today, then n.p.o. at midnight for procedure tomorrow - We will plan for sampling of pancreatic pseudocyst fluid tomorrow to send for culture and sensitivities followed by cyst gastrostomy placement via [place] stent - Consult General Surgery service for evaluation and awareness prior to cyst gastrostomy placement - We will continue to follow closely.  Sincerely appreciate excellent care and coordination of consulting services by primary Hospitalist service   [address], [place], [place] [phone] office

## 2017-12-10 NOTE — Progress Notes (Signed)
PROGRESS NOTE    Paul [name]  VPX:[id number] DOB: 1981-03-20 DOA: 12/08/2017 PCP: Karleen Hampshire., MD    Brief Narrative:Paul [name] is a 37 y.o. male with history of renal cell carcinoma status post left-sided nephrectomy in 2012 who was admitted for idiopathic pancreatitis last month and had necrotizing pancreatitis eventually discharged home on IV antibiotics last dose was on December 02, 2017 last week presents to the ER with complaints of fever and chills for last 2 days.  Denies any nausea vomiting abdominal pain diarrhea able to eat well denies any chest pain productive cough headache visual symptoms.  Patient also was discharged on insulin for hyperglycemia which patient states has improved and has stopped using long-acting insulin but last 2 days his blood sugar started increasing again.  ED Course: In the ER patient had a fever of 102 F chest x-ray and urine were unremarkable.  CT of the abdomen shows features of necrotizing pancreatitis with no definite abscess.  Blood cultures are obtained started on fluid per sepsis protocol and admitted for possible developing sepsis.  Likely source could be intra-abdominal.     Assessment & Plan:   Principal Problem:   Sepsis ([place]) Active Problems:   Essential hypertension   Necrotizing pancreatitis   Hyperglycemia   Pancreatic abscess  1-Sepsis; presents with fevers, leukocytosis. Source of infection probably necrotizing pancreatitis.  On meropenem UA negative Blood culture. No growth to date.  Elevated lactic acid, pro-calcitonin level.  NSL.   2-Necrotizing pancreatitis;  CT shows inflammatory changes. GI consulted.  Will consult ID.  Continue with meropenem.  Mild elevation of bilirubin.  Bili trending down,  Plan for EUS stent , drainage fluid 9-13  3-HTN; hold Norvasc. Continue with atenolol.   4-Hyperglycemia; Continue with SSI. Might need long acting.   History of nephrectomy for left-sided renal cell carcinoma in  2012.  Hypokalemia; replete orally and IV.   Anemia; no melena. Check anemia pane.   Hypoxemia; transiently. NSL  Incentive spirometry   DVT prophylaxis: SCD.  Code Status: full code.  Family Communication: care discussed with patient  Disposition Plan: remain inpatient for IV antibiotics.    Consultants:   GI   ID   Procedures:  \none  Antimicrobials: meropenem.   Subjective: Denies abdominal pain. Tolerating diet.   Objective: Vitals:   12/09/17 1707 12/09/17 2016 12/09/17 2316 12/10/17 0601  BP: 117/81 120/88 128/87 128/85  Pulse: 84 84 93 85  Resp: (!) 21 (!) 21 (!) 23 (!) 22  Temp: 98.3 F (36.8 C) 99.3 F (37.4 C) (!) 100.6 F (38.1 C) 99.1 F (37.3 C)  TempSrc: Oral Oral Oral Oral  SpO2: 98% 98% 98% 95%  Weight:      Height:        Intake/Output Summary (Last 24 hours) at 12/10/2017 1441 Last data filed at 12/10/2017 0600 Gross per 24 hour  Intake 3757.76 ml  Output -  Net 3757.76 ml   Filed Weights   12/09/17 0302  Weight: 77.1 kg    Examination:  General exam: NAD Respiratory system: CTA Cardiovascular system: S 1, S 2 RRR Gastrointestinal system: BS present, soft, nt Central nervous system: non focal.  Extremities: symmetric power.  Skin: no rashes.    Data Reviewed: I have personally reviewed following labs and imaging studies  CBC: Recent Labs  Lab 12/08/17 1659 12/09/17 0313 12/10/17 0618  WBC 13.5* 7.3 5.4  NEUTROABS  --  4.9  --   HGB 11.8* 10.6* 9.3*  HCT 36.6*  33.1* 29.0*  MCV 86.9 85.8 85.8  PLT 235 162 242   Basic Metabolic Panel: Recent Labs  Lab 12/08/17 1659 12/09/17 0313 12/10/17 0618  NA 133* 136 135  K 4.0 3.6 3.2*  CL 98 105 107  CO2 21* 20* 19*  GLUCOSE 220* 167* 133*  BUN 8 8 8   CREATININE 1.17 1.17 1.10  CALCIUM 9.5 8.5* 7.9*   GFR: Estimated Creatinine Clearance: 95.9 mL/min (by C-G formula based on SCr of 1.1 mg/dL). Liver Function Tests: Recent Labs  Lab 12/08/17 1659  12/09/17 0313 12/10/17 0618  AST 42* 34 26  ALT 41 38 31  ALKPHOS 113 97 88  BILITOT 1.5* 2.0* 1.3*  PROT 7.2 6.1* 5.6*  ALBUMIN 3.4* 2.9* 2.4*   Recent Labs  Lab 12/08/17 1659  LIPASE 41   No results for input(s): AMMONIA in the last 168 hours. Coagulation Profile: Recent Labs  Lab 12/09/17 0313  INR 1.37   Cardiac Enzymes: No results for input(s): CKTOTAL, CKMB, CKMBINDEX, TROPONINI in the last 168 hours. BNP (last 3 results) No results for input(s): PROBNP in the last 8760 hours. HbA1C: No results for input(s): HGBA1C in the last 72 hours. CBG: Recent Labs  Lab 12/09/17 1201 12/09/17 1710 12/09/17 2014 12/10/17 0730 12/10/17 1133  GLUCAP 140* 111* 113* 124* 186*   Lipid Profile: No results for input(s): CHOL, HDL, LDLCALC, TRIG, CHOLHDL, LDLDIRECT in the last 72 hours. Thyroid Function Tests: No results for input(s): TSH, T4TOTAL, FREET4, T3FREE, THYROIDAB in the last 72 hours. Anemia Panel: No results for input(s): VITAMINB12, FOLATE, FERRITIN, TIBC, IRON, RETICCTPCT in the last 72 hours. Sepsis Labs: Recent Labs  Lab 12/08/17 1719 12/08/17 2011 12/09/17 0313 12/09/17 0615  PROCALCITON  --   --  1.29  --   LATICACIDVEN 1.96* 2.56* 1.9 1.1    Recent Results (from the past 240 hour(s))  Culture, blood (routine x 2)     Status: None (Preliminary result)   Collection Time: 12/08/17 11:59 PM  Result Value Ref Range Status   Specimen Description BLOOD LEFT ANTECUBITAL  Final   Special Requests   Final    BOTTLES DRAWN AEROBIC AND ANAEROBIC Blood Culture adequate volume   Culture   Final    NO GROWTH 1 DAY Performed at [facility], [place] [address] [place], [place] [address]    Report Status PENDING  Incomplete  Culture, blood (routine x 2)     Status: None (Preliminary result)   Collection Time: 12/09/17 12:00 AM  Result Value Ref Range Status   Specimen Description BLOOD RIGHT ANTECUBITAL  Final   Special Requests   Final    BOTTLES DRAWN  AEROBIC AND ANAEROBIC Blood Culture results may not be optimal due to an inadequate volume of blood received in culture bottles   Culture   Final    NO GROWTH 1 DAY Performed at [facility], [place] [address] [place], [place] [address]    Report Status PENDING  Incomplete  MRSA PCR Screening     Status: None   Collection Time: 12/09/17  5:45 AM  Result Value Ref Range Status   MRSA by PCR NEGATIVE NEGATIVE Final    Comment:        The GeneXpert MRSA Assay (FDA approved for NASAL specimens only), is one component of a comprehensive MRSA colonization surveillance program. It is not intended to diagnose MRSA infection nor to guide or monitor treatment for MRSA infections. Performed at [facility],  [address] [place], [place] [address]          Radiology Studies: Dg Chest 2 View  Result Date: 12/08/2017 CLINICAL DATA:  Pancreatitis EXAM: CHEST - 2 VIEW COMPARISON:  11/12/2017, CT 11/16/2017 FINDINGS: Low lung volumes with atelectasis at the right base. No consolidation or effusion. Cardiomediastinal silhouette within normal limits. No pneumothorax. IMPRESSION: No active cardiopulmonary disease. Low lung volumes with atelectasis at the right base. Electronically Signed   By: Donavan Foil M.D.   On: 12/08/2017 21:04   Ct Abdomen Pelvis W Contrast  Result Date: 12/08/2017 CLINICAL DATA:  Sepsis and fever.  Recent pancreatitis. EXAM: CT ABDOMEN AND PELVIS WITH CONTRAST TECHNIQUE: Multidetector CT imaging of the abdomen and pelvis was performed using the standard protocol following bolus administration of intravenous contrast. CONTRAST:  143mL ISOVUE-300 IOPAMIDOL (ISOVUE-300) INJECTION 61% COMPARISON:  Most recent CT 11/16/2017 FINDINGS: Lower chest: Resolved left pleural effusion with mild residual atelectasis. Minimal right lung base atelectasis. Small paraesophageal node, unchanged. Hepatobiliary: No focal liver abnormality is seen. No gallstones, gallbladder wall  thickening, or biliary dilatation. Pancreas: Again seen sequela of necrotizing pancreatitis with near complete fluid replacement of pancreatic parenchyma. No internal air. Scattered irregular foci of soft tissue attenuation within the pancreas for example image 31 and 35 series 3 likely represent residual pancreatic parenchyma. Surrounding peripancreatic inflammatory stranding persists but is improved in the interim. The previous lesser sac fluid collection now appears contiguous with the necrotic pancreatic parenchyma, and is not separately distinguished. Small amount of fluid insinuates superiorly in the lesser sac image 28 series 3 with slight increase from prior exam. Fluid in the right pericolic gutter has diminished but appears organizing and is lobular in appearance. No discrete extra pancreatic fluid collection. Spleen: Normal in size. Fluid or pace pancreatic tail abuts the splenic hilum. The splenic vein slightly narrowed but remains patent. Adrenals/Urinary Tract: Post left nephrectomy. Compensatory hypertrophy of the right kidney. No right hydronephrosis. Urinary bladder is physiologically distended without wall thickening. Stomach/Bowel: Bowel evaluation is limited in the absence of enteric contrast. Mild gastric wall enhancement and wall thickening likely reactive due to adjacent pancreatic inflammation. No evidence of bowel obstruction. Borderline wall thickening of the ascending colon is likely reactive. Air-filled appendix is prominent diameter proximally but without periappendiceal inflammation. Vascular/Lymphatic: Splenic vein, portal vein, and mesenteric vessels are patent. Normal caliber abdominal aorta. Multiple small retroperitoneal nodes are likely reactive and not enlarged by size criteria. Reproductive: Prostate is unremarkable. Other: No free air. Persistent soft tissue density in the right pericolic gutter with decreased fluid but increasing lobular soft tissue components.  Musculoskeletal: Bone islands in the pelvis and proximal femora. There are no acute or suspicious osseous abnormalities. IMPRESSION: 1. Severe necrotizing pancreatitis. Persistent but slight decreased peripancreatic inflammation since CT 3 weeks ago. The previous lesser sac fluid collection is now contiguous with the necrotic pancreatic parenchyma and not separately distinguished. Slight increased fluid in the lesser sac but no new organizing fluid collection. 2. Decreasing fluid in the right pericolic gutter with residual soft tissue nodular densities, no internal air to suggest superimposed infection. 3. Persistent wall thickening of the ascending colon, likely reactive. 4. Resolved left pleural effusion. Electronically Signed   By: Keith Rake M.D.   On: 12/08/2017 23:11        Scheduled Meds: . allopurinol  100 mg Oral BID  . atenolol  50 mg Oral Daily  . insulin aspart  0-9 Units Subcutaneous TID WC  . vitamin B-12  1,000 mcg Oral Daily   Continuous Infusions: . meropenem (MERREM) IV 1 g (12/10/17 0600)     LOS: 2 days    Time spent: 35 minutes.     Elmarie Shiley, MD [facility] Pager [phone]  If 7PM-7AM, please contact night-coverage [email or web address] Password [facility] 12/10/2017, 2:41 PM

## 2017-12-10 NOTE — H&P (View-Only) (Signed)
EUS with cyst gastrostomy using Axios stent is set for tomorrow 1300 with Dr Rush Landmark.    Azucena Freed PA-C [phone].

## 2017-12-10 NOTE — Consult Note (Signed)
Reason for Consult: Necrotizing pancreatitis Referring Physician: Lavena Bullion, DO Chief complaint: History of pancreatitis with new fevers and nausea.  Paul [name] is an 37 y.o. male.   HPI: She is a 37 year old male who underwent a left-sided nephrectomy in 2012 for renal cell carcinoma.  He was hospitalized 11/04/2017, with acute pancreatitis and intractable nausea and vomiting.  Ultimately discharged on 11/18/2017.  He completed 3 weeks of meropenem.  The first week of antibiotics was in the hospital the last 2 weeks were at home.  Etiology for his pancreatitis was never found.  He did well after being placed on this antibiotic therapy.  He started having symptoms again about 1 week after completing the 3-week course of meropenem.    He was readmitted on 12/09/17 with the above-noted changes.  He had a fever up to 102 in the emergency department.He was febrile intermittently throughout the evening.  Labs this a.m. shows a normal sodium 135, potassium 3.2, CO2 of 19, glucose of 133, BUN of 8 creatinine 1.0.  Calcium 7.9.  Albumin 2.4.  AST is 26 ALT is 31 total bilirubin is 1.3.  Admission lipase was 41 and normal.  Lactate increased from 1.96-2.56 overnight.   WBC has gone from 13.5 down to 7.3 this morning.  CT of the abdomen and pelvis with contrast was completed on 12/08/2017, this shows severe necrotizing pancreatitis with a persistent but slight decrease in the peripancreatic inflammation compared to his last CT 11/16/2017.  Prior CT showed lesser sac fluid collections now continuous with necrotic pancreatic parenchyma and not separately distinguished.  There is a slight increase in the fluid in the lesser sac but no new organized fluid.  There is decreased fluid in the right paracolic gutter with residual soft tissue nodular densities no internal air to suggest superimposed infection.  Persistent wall thickening of the ascending colon likely reactive and a resolved pleural effusion.   He was  admitted by Medicine with systemic inflammatory response syndrome, necrotizing pancreatitis, hypertension, hyperglycemia and a history of left nephrectomy for renal cell carcinoma. He has been seen and evaluated by GI/ Dr. Gerrit [name].  He is currently planning to take patient to endoscopy for pancreatic pseudocyst fluid aspiration and probable cyst gastrostomy placement with an axial stent tomorrow.  We have been asked to see in consultation with Dr. Bryan [name].  He is also been seen by infectious disease and Dr. Rhina Brackett [name].  He agrees with assessment of necrotizing pancreatitis and agrees with ongoing meropenem therapy.   Past Medical History:  Diagnosis Date  . Cancer of kidney [facility]) [name]   "left"  . Elevated blood uric acid level    "I take RX for it; Allopurinol" (11/04/2017)  . GERD (gastroesophageal reflux disease)   . High cholesterol   . Hypertension   . Migraine    "only in my teens" (11/04/2017)  . Pancreatitis     Past Surgical History:  Procedure Laterality Date  . NEPHRECTOMY Left [name]   "kidney cancer"  . UPPER GASTROINTESTINAL ENDOSCOPY  02/2017    History reviewed. No pertinent family history.  Social History:  reports that he quit smoking about 8 years ago. His smoking use included cigarettes. He has a 5.50 pack-year smoking history. He has never used smokeless tobacco. He reports that he drinks about 4.0 standard drinks of alcohol per week. He reports that he does not use drugs.  Allergies:  Allergies  Allergen Reactions  . Honey Diarrhea and Nausea And Vomiting  Medications:  Prior to Admission:  Medications Prior to Admission  Medication Sig Dispense Refill Last Dose  . allopurinol (ZYLOPRIM) 100 MG tablet Take 100 mg by mouth 2 (two) times daily.  1 12/08/2017 at Unknown time  . amLODipine (NORVASC) 2.5 MG tablet Take 2.5 mg by mouth at bedtime.   2 12/07/2017 at Unknown time  . atenolol (TENORMIN) 50 MG tablet Take 50 mg by mouth daily.  3  12/08/2017 at 1000  . ibuprofen (ADVIL,MOTRIN) 200 MG tablet Take 200 mg by mouth every 6 (six) hours as needed for fever.   12/08/2017 at Unknown time  . insulin aspart (NOVOLOG) 100 UNIT/ML FlexPen Before each meal 3 times a day, 140-199 - 2 units, 200-250 - 4 units, 251-299 - 6 units,  300-349 - 8 units,  350 or above 10 units. Insulin PEN if approved, provide syringes and needles if needed. (Patient taking differently: Inject 0-10 Units into the skin 3 (three) times daily before meals. Per sliding scale: 140-199 - 2 units, 200-250 - 4 units, 251-299 - 6 units,  300-349 - 8 units,  350 or above 10 units.) 15 mL 0 12/08/2017 at Unknown time  . insulin glargine (LANTUS) 100 unit/mL SOPN Inject 0.15 mLs (15 Units total) into the skin at bedtime. (Patient taking differently: Inject 15 Units into the skin at bedtime. Do not take if bs is 80 or lower) 15 mL 0 Past Week at Unknown time  . vitamin B-12 (CYANOCOBALAMIN) 1000 MCG tablet Take 1,000 mcg by mouth daily.   12/08/2017 at Unknown time  . Amino Acids-Protein Hydrolys (FEEDING SUPPLEMENT, PRO-STAT SUGAR FREE 64,) LIQD Take 30 mLs by mouth 3 (three) times daily between meals. (Patient not taking: Reported on 12/08/2017) 900 mL 0 Completed Course at Unknown time  . blood glucose meter kit and supplies Dispense based on patient and insurance preference. Use up to four times daily as directed. (FOR ICD-10 E10.9, E11.9). 1 each 0   . Insulin Pen Needle 31G X 5 MM MISC For insulin injection, please provide a month supply. 100 each 0   . LEVEMIR FLEXTOUCH 100 UNIT/ML Pen Inject 15 Units into the skin at bedtime.  3 not started yet   Scheduled: . allopurinol  100 mg Oral BID  . atenolol  50 mg Oral Daily  . insulin aspart  0-9 Units Subcutaneous TID WC  . vitamin B-12  1,000 mcg Oral Daily   Continuous: . meropenem (MERREM) IV 1 g (12/10/17 0600)   Anti-infectives (From admission, onward)   Start     Dose/Rate Route Frequency Ordered Stop   12/09/17 1200   vancomycin (VANCOCIN) IVPB 750 mg/150 ml premix  Status:  Discontinued     750 mg 150 mL/hr over 60 Minutes Intravenous Every 8 hours 12/09/17 0401 12/09/17 1142   12/09/17 0600  meropenem (MERREM) 1 g in sodium chloride 0.9 % 100 mL IVPB     1 g 200 mL/hr over 30 Minutes Intravenous Every 8 hours 12/09/17 0254     12/09/17 0415  vancomycin (VANCOCIN) 1,500 mg in sodium chloride 0.9 % 500 mL IVPB     1,500 mg 250 mL/hr over 120 Minutes Intravenous  Once 12/09/17 0401 12/09/17 1905   12/08/17 2330  piperacillin-tazobactam (ZOSYN) IVPB 3.375 g     3.375 g 100 mL/hr over 30 Minutes Intravenous  Once 12/08/17 2323 12/09/17 0038      Results for orders placed or performed during the hospital encounter of 12/08/17 (from the past 48  hour(s))  Lipase, blood     Status: None   Collection Time: 12/08/17  4:59 PM  Result Value Ref Range   Lipase 41 11 - 51 U/L    Comment: Performed at [facility] [address], [place], [place] [address]  Comprehensive metabolic panel     Status: Abnormal   Collection Time: 12/08/17  4:59 PM  Result Value Ref Range   Sodium 133 (L) 135 - 145 mmol/L   Potassium 4.0 3.5 - 5.1 mmol/L   Chloride 98 98 - 111 mmol/L   CO2 21 (L) 22 - 32 mmol/L   Glucose, Bld 220 (H) 70 - 99 mg/dL   BUN 8 6 - 20 mg/dL   Creatinine, Ser 1.17 0.61 - 1.24 mg/dL   Calcium 9.5 8.9 - 10.3 mg/dL   Total Protein 7.2 6.5 - 8.1 g/dL   Albumin 3.4 (L) 3.5 - 5.0 g/dL   AST 42 (H) 15 - 41 U/L   ALT 41 0 - 44 U/L   Alkaline Phosphatase 113 38 - 126 U/L   Total Bilirubin 1.5 (H) 0.3 - 1.2 mg/dL   GFR calc non Af Amer >60 >60 mL/min   GFR calc Af Amer >60 >60 mL/min    Comment: (NOTE) The eGFR has been calculated using the CKD EPI equation. This calculation has not been validated in all clinical situations. eGFR's persistently <60 mL/min signify possible Chronic Kidney Disease.    Anion gap 14 5 - 15    Comment: Performed at [facility] [address] [place],  [place] [address]  CBC     Status: Abnormal   Collection Time: 12/08/17  4:59 PM  Result Value Ref Range   WBC 13.5 (H) 4.0 - 10.5 K/uL   RBC 4.21 (L) 4.22 - 5.81 MIL/uL   Hemoglobin 11.8 (L) 13.0 - 17.0 g/dL   HCT 36.6 (L) 39.0 - 52.0 %   MCV 86.9 78.0 - 100.0 fL   MCH 28.0 26.0 - 34.0 pg   MCHC 32.2 30.0 - 36.0 g/dL   RDW 13.1 11.5 - 15.5 %   Platelets 235 150 - 400 K/uL    Comment: Performed at [facility], [place] [address] [place], [place] [address]  Urinalysis, Routine w reflex microscopic     Status: Abnormal   Collection Time: 12/08/17  5:00 PM  Result Value Ref Range   Color, Urine YELLOW YELLOW   APPearance CLEAR CLEAR   Specific Gravity, Urine 1.003 (L) 1.005 - 1.030   pH 6.0 5.0 - 8.0   Glucose, UA >=500 (A) NEGATIVE mg/dL   Hgb urine dipstick SMALL (A) NEGATIVE   Bilirubin Urine NEGATIVE NEGATIVE   Ketones, ur NEGATIVE NEGATIVE mg/dL   Protein, ur NEGATIVE NEGATIVE mg/dL   Nitrite NEGATIVE NEGATIVE   Leukocytes, UA NEGATIVE NEGATIVE   RBC / HPF 0-5 0 - 5 RBC/hpf   WBC, UA 0-5 0 - 5 WBC/hpf   Bacteria, UA NONE SEEN NONE SEEN    Comment: Performed at [facility] [address], [place], [place] [address]  I-Stat CG4 Lactic Acid, ED     Status: Abnormal   Collection Time: 12/08/17  5:19 PM  Result Value Ref Range   Lactic Acid, Venous 1.96 (H) 0.5 - 1.9 mmol/L  I-Stat CG4 Lactic Acid, ED     Status: Abnormal   Collection Time: 12/08/17  8:11 PM  Result Value Ref Range   Lactic Acid, Venous 2.56 (HH)  0.5 - 1.9 mmol/L   Comment NOTIFIED PHYSICIAN   Culture, blood (routine x 2)     Status: None (Preliminary result)   Collection Time: 12/08/17 11:59 PM  Result Value Ref Range   Specimen Description BLOOD LEFT ANTECUBITAL    Special Requests      BOTTLES DRAWN AEROBIC AND ANAEROBIC Blood Culture adequate volume   Culture      NO GROWTH 1 DAY Performed at [facility], [place] [address] [place], [place] [address]    Report Status PENDING   Culture,  blood (routine x 2)     Status: None (Preliminary result)   Collection Time: 12/09/17 12:00 AM  Result Value Ref Range   Specimen Description BLOOD RIGHT ANTECUBITAL    Special Requests      BOTTLES DRAWN AEROBIC AND ANAEROBIC Blood Culture results may not be optimal due to an inadequate volume of blood received in culture bottles   Culture      NO GROWTH 1 DAY Performed at [facility] [address], [place], [place] [address]    Report Status PENDING   CBC with Differential     Status: Abnormal   Collection Time: 12/09/17  3:13 AM  Result Value Ref Range   WBC 7.3 4.0 - 10.5 K/uL   RBC 3.86 (L) 4.22 - 5.81 MIL/uL   Hemoglobin 10.6 (L) 13.0 - 17.0 g/dL   HCT 33.1 (L) 39.0 - 52.0 %   MCV 85.8 78.0 - 100.0 fL   MCH 27.5 26.0 - 34.0 pg   MCHC 32.0 30.0 - 36.0 g/dL   RDW 13.0 11.5 - 15.5 %   Platelets 162 150 - 400 K/uL   Neutrophils Relative % 67 %   Lymphocytes Relative 21 %   Monocytes Relative 12 %   Eosinophils Relative 0 %   Basophils Relative 0 %   Neutro Abs 4.9 1.7 - 7.7 K/uL   Lymphs Abs 1.5 0.7 - 4.0 K/uL   Monocytes Absolute 0.9 0.1 - 1.0 K/uL   Eosinophils Absolute 0.0 0.0 - 0.7 K/uL   Basophils Absolute 0.0 0.0 - 0.1 K/uL   RBC Morphology POLYCHROMASIA PRESENT     Comment: OVAL MACROCYTES   WBC Morphology INCREASED BANDS (>20% BANDS)     Comment: MILD LEFT SHIFT (1-5% METAS, OCC MYELO, OCC BANDS) Performed at [facility] [address] [place], [place] [address]   Lactic acid, plasma     Status: None   Collection Time: 12/09/17  3:13 AM  Result Value Ref Range   Lactic Acid, Venous 1.9 0.5 - 1.9 mmol/L    Comment: Performed at [facility] [address] [place], [place] [address]  Procalcitonin     Status: None   Collection Time: 12/09/17  3:13 AM  Result Value Ref Range   Procalcitonin 1.29 ng/mL    Comment:        Interpretation: PCT > 0.5 ng/mL and <= 2 ng/mL: Systemic infection (sepsis) is possible, but other conditions are  known to elevate PCT as well. (NOTE)       Sepsis PCT Algorithm           Lower Respiratory Tract                                      Infection PCT Algorithm    ----------------------------     ----------------------------  PCT < 0.25 ng/mL                PCT < 0.10 ng/mL         Strongly encourage             Strongly discourage   discontinuation of antibiotics    initiation of antibiotics    ----------------------------     -----------------------------       PCT 0.25 - 0.50 ng/mL            PCT 0.10 - 0.25 ng/mL               OR       >80% decrease in PCT            Discourage initiation of                                            antibiotics      Encourage discontinuation           of antibiotics    ----------------------------     -----------------------------         PCT >= 0.50 ng/mL              PCT 0.26 - 0.50 ng/mL                AND       <80% decrease in PCT             Encourage initiation of                                             antibiotics       Encourage continuation           of antibiotics    ----------------------------     -----------------------------        PCT >= 0.50 ng/mL                  PCT > 0.50 ng/mL               AND         increase in PCT                  Strongly encourage                                      initiation of antibiotics    Strongly encourage escalation           of antibiotics                                     -----------------------------                                           PCT <= 0.25 ng/mL  OR                                        > 80% decrease in PCT                                     Discontinue / Do not initiate                                             antibiotics Performed at [facility], [place] [address], [place], [place] [address]   Protime-INR     Status: Abnormal   Collection Time: 12/09/17  3:13 AM  Result Value Ref Range    Prothrombin Time 16.7 (H) 11.4 - 15.2 seconds   INR 1.37     Comment: Performed at [facility] [address] [place], [place] [address]  APTT     Status: None   Collection Time: 12/09/17  3:13 AM  Result Value Ref Range   aPTT 35 24 - 36 seconds    Comment: Performed at [facility] [address], [place], [place] [address]  Basic metabolic panel     Status: Abnormal   Collection Time: 12/09/17  3:13 AM  Result Value Ref Range   Sodium 136 135 - 145 mmol/L   Potassium 3.6 3.5 - 5.1 mmol/L   Chloride 105 98 - 111 mmol/L   CO2 20 (L) 22 - 32 mmol/L   Glucose, Bld 167 (H) 70 - 99 mg/dL   BUN 8 6 - 20 mg/dL   Creatinine, Ser 1.17 0.61 - 1.24 mg/dL   Calcium 8.5 (L) 8.9 - 10.3 mg/dL   GFR calc non Af Amer >60 >60 mL/min   GFR calc Af Amer >60 >60 mL/min    Comment: (NOTE) The eGFR has been calculated using the CKD EPI equation. This calculation has not been validated in all clinical situations. eGFR's persistently <60 mL/min signify possible Chronic Kidney Disease.    Anion gap 11 5 - 15    Comment: Performed at [facility] [address] [place], [place] [address]  Hepatic function panel     Status: Abnormal   Collection Time: 12/09/17  3:13 AM  Result Value Ref Range   Total Protein 6.1 (L) 6.5 - 8.1 g/dL   Albumin 2.9 (L) 3.5 - 5.0 g/dL   AST 34 15 - 41 U/L   ALT 38 0 - 44 U/L   Alkaline Phosphatase 97 38 - 126 U/L   Total Bilirubin 2.0 (H) 0.3 - 1.2 mg/dL   Bilirubin, Direct 0.7 (H) 0.0 - 0.2 mg/dL   Indirect Bilirubin 1.3 (H) 0.3 - 0.9 mg/dL    Comment: Performed at [facility] [address] [place], [place] [address]  Type and screen [place]     Status: None   Collection Time: 12/09/17  3:15 AM  Result Value Ref Range   ABO/RH(D) A POS    Antibody Screen NEG    Sample Expiration      12/12/2017 Performed at [facility], [place] [address], [place],  [address]   ABO/Rh     Status:  None   Collection  Time: 12/09/17  3:15 AM  Result Value Ref Range   ABO/RH(D)      A POS Performed at [facility], [place] [address] [place], [place] [address]   MRSA PCR Screening     Status: None   Collection Time: 12/09/17  5:45 AM  Result Value Ref Range   MRSA by PCR NEGATIVE NEGATIVE    Comment:        The GeneXpert MRSA Assay (FDA approved for NASAL specimens only), is one component of a comprehensive MRSA colonization surveillance program. It is not intended to diagnose MRSA infection nor to guide or monitor treatment for MRSA infections. Performed at [facility], [place] [address], [place], [place] [address]   Lactic acid, plasma     Status: None   Collection Time: 12/09/17  6:15 AM  Result Value Ref Range   Lactic Acid, Venous 1.1 0.5 - 1.9 mmol/L    Comment: Performed at [facility] [address] [place], [place] [address]  Glucose, capillary     Status: Abnormal   Collection Time: 12/09/17  7:52 AM  Result Value Ref Range   Glucose-Capillary 127 (H) 70 - 99 mg/dL  Glucose, capillary     Status: Abnormal   Collection Time: 12/09/17 12:01 PM  Result Value Ref Range   Glucose-Capillary 140 (H) 70 - 99 mg/dL  Glucose, capillary     Status: Abnormal   Collection Time: 12/09/17  5:10 PM  Result Value Ref Range   Glucose-Capillary 111 (H) 70 - 99 mg/dL  Glucose, capillary     Status: Abnormal   Collection Time: 12/09/17  8:14 PM  Result Value Ref Range   Glucose-Capillary 113 (H) 70 - 99 mg/dL  CBC     Status: Abnormal   Collection Time: 12/10/17  6:18 AM  Result Value Ref Range   WBC 5.4 4.0 - 10.5 K/uL   RBC 3.38 (L) 4.22 - 5.81 MIL/uL   Hemoglobin 9.3 (L) 13.0 - 17.0 g/dL   HCT 29.0 (L) 39.0 - 52.0 %   MCV 85.8 78.0 - 100.0 fL   MCH 27.5 26.0 - 34.0 pg   MCHC 32.1 30.0 - 36.0 g/dL   RDW 13.1 11.5 - 15.5 %   Platelets 165 150 - 400 K/uL    Comment: Performed at [facility], [place]. [address] [place], [place] [address]  Comprehensive metabolic panel      Status: Abnormal   Collection Time: 12/10/17  6:18 AM  Result Value Ref Range   Sodium 135 135 - 145 mmol/L   Potassium 3.2 (L) 3.5 - 5.1 mmol/L   Chloride 107 98 - 111 mmol/L   CO2 19 (L) 22 - 32 mmol/L   Glucose, Bld 133 (H) 70 - 99 mg/dL   BUN 8 6 - 20 mg/dL   Creatinine, Ser 1.10 0.61 - 1.24 mg/dL   Calcium 7.9 (L) 8.9 - 10.3 mg/dL   Total Protein 5.6 (L) 6.5 - 8.1 g/dL   Albumin 2.4 (L) 3.5 - 5.0 g/dL   AST 26 15 - 41 U/L   ALT 31 0 - 44 U/L   Alkaline Phosphatase 88 38 - 126 U/L   Total Bilirubin 1.3 (H) 0.3 - 1.2 mg/dL   GFR calc non Af Amer >60 >60 mL/min   GFR calc Af Amer >60 >60 mL/min    Comment: (NOTE) The eGFR has been calculated using the CKD EPI equation. This  calculation has not been validated in all clinical situations. eGFR's persistently <60 mL/min signify possible Chronic Kidney Disease.    Anion gap 9 5 - 15    Comment: Performed at [facility] [address], [place], [place] [address]  Glucose, capillary     Status: Abnormal   Collection Time: 12/10/17  7:30 AM  Result Value Ref Range   Glucose-Capillary 124 (H) 70 - 99 mg/dL  Glucose, capillary     Status: Abnormal   Collection Time: 12/10/17 11:33 AM  Result Value Ref Range   Glucose-Capillary 186 (H) 70 - 99 mg/dL    Dg Chest 2 View  Result Date: 12/08/2017 CLINICAL DATA:  Pancreatitis EXAM: CHEST - 2 VIEW COMPARISON:  11/12/2017, CT 11/16/2017 FINDINGS: Low lung volumes with atelectasis at the right base. No consolidation or effusion. Cardiomediastinal silhouette within normal limits. No pneumothorax. IMPRESSION: No active cardiopulmonary disease. Low lung volumes with atelectasis at the right base. Electronically Signed   By: Donavan Foil M.D.   On: 12/08/2017 21:04   Ct Abdomen Pelvis W Contrast  Result Date: 12/08/2017 CLINICAL DATA:  Sepsis and fever.  Recent pancreatitis. EXAM: CT ABDOMEN AND PELVIS WITH CONTRAST TECHNIQUE: Multidetector CT imaging of the abdomen and pelvis was  performed using the standard protocol following bolus administration of intravenous contrast. CONTRAST:  145m ISOVUE-300 IOPAMIDOL (ISOVUE-300) INJECTION 61% COMPARISON:  Most recent CT 11/16/2017 FINDINGS: Lower chest: Resolved left pleural effusion with mild residual atelectasis. Minimal right lung base atelectasis. Small paraesophageal node, unchanged. Hepatobiliary: No focal liver abnormality is seen. No gallstones, gallbladder wall thickening, or biliary dilatation. Pancreas: Again seen sequela of necrotizing pancreatitis with near complete fluid replacement of pancreatic parenchyma. No internal air. Scattered irregular foci of soft tissue attenuation within the pancreas for example image 31 and 35 series 3 likely represent residual pancreatic parenchyma. Surrounding peripancreatic inflammatory stranding persists but is improved in the interim. The previous lesser sac fluid collection now appears contiguous with the necrotic pancreatic parenchyma, and is not separately distinguished. Small amount of fluid insinuates superiorly in the lesser sac image 28 series 3 with slight increase from prior exam. Fluid in the right pericolic gutter has diminished but appears organizing and is lobular in appearance. No discrete extra pancreatic fluid collection. Spleen: Normal in size. Fluid or pace pancreatic tail abuts the splenic hilum. The splenic vein slightly narrowed but remains patent. Adrenals/Urinary Tract: Post left nephrectomy. Compensatory hypertrophy of the right kidney. No right hydronephrosis. Urinary bladder is physiologically distended without wall thickening. Stomach/Bowel: Bowel evaluation is limited in the absence of enteric contrast. Mild gastric wall enhancement and wall thickening likely reactive due to adjacent pancreatic inflammation. No evidence of bowel obstruction. Borderline wall thickening of the ascending colon is likely reactive. Air-filled appendix is prominent diameter proximally but  without periappendiceal inflammation. Vascular/Lymphatic: Splenic vein, portal vein, and mesenteric vessels are patent. Normal caliber abdominal aorta. Multiple small retroperitoneal nodes are likely reactive and not enlarged by size criteria. Reproductive: Prostate is unremarkable. Other: No free air. Persistent soft tissue density in the right pericolic gutter with decreased fluid but increasing lobular soft tissue components. Musculoskeletal: Bone islands in the pelvis and proximal femora. There are no acute or suspicious osseous abnormalities. IMPRESSION: 1. Severe necrotizing pancreatitis. Persistent but slight decreased peripancreatic inflammation since CT 3 weeks ago. The previous lesser sac fluid collection is now contiguous with the necrotic pancreatic parenchyma and not separately distinguished. Slight increased fluid in the lesser sac but no new organizing fluid  collection. 2. Decreasing fluid in the right pericolic gutter with residual soft tissue nodular densities, no internal air to suggest superimposed infection. 3. Persistent wall thickening of the ascending colon, likely reactive. 4. Resolved left pleural effusion. Electronically Signed   By: Keith Rake M.D.   On: 12/08/2017 23:11    Review of Systems  Constitutional: Positive for chills and fever. Negative for weight loss.  HENT: Negative.   Eyes: Negative.   Respiratory: Negative.   Cardiovascular: Negative.   Gastrointestinal: Negative.   Genitourinary: Negative.   Musculoskeletal: Negative.   Skin: Negative.   Neurological: Negative.   Endo/Heme/Allergies: Negative.   Psychiatric/Behavioral: Negative.    Blood pressure 128/85, pulse 85, temperature 99.1 F (37.3 C), temperature source Oral, resp. rate (!) 22, height _0  (1.778 m), weight 77.1 kg, SpO2 95 %. Physical Exam  Constitutional: He is oriented to person, place, and time. He appears well-developed and well-nourished. No distress.  HENT:  Head: Normocephalic  and atraumatic.  Mouth/Throat: Oropharynx is clear and moist. No oropharyngeal exudate.  Eyes: Right eye exhibits no discharge. Left eye exhibits no discharge. No scleral icterus.  Pupils are equal  Neck: Normal range of motion. Neck supple. No JVD present. No tracheal deviation present. No thyromegaly present.  Cardiovascular: Normal rate, regular rhythm, normal heart sounds and intact distal pulses.  No murmur heard. Respiratory: Effort normal and breath sounds normal. No respiratory distress. He has no wheezes. He has no rales. He exhibits no tenderness.  GI: Soft. Bowel sounds are normal. He exhibits no distension and no mass. There is no tenderness. There is no rebound and no guarding.  Musculoskeletal: He exhibits no edema or tenderness.  Lymphadenopathy:    He has no cervical adenopathy.  Neurological: He is alert and oriented to person, place, and time. No cranial nerve deficit.  Skin: Skin is warm and dry. No rash noted. He is not diaphoretic. No erythema. No pallor.  Psychiatric: He has a normal mood and affect. His behavior is normal. Judgment and thought content normal.    Assessment/Plan: Necrotizing pancreatitis of uncertain etiology Hx of left renal cancer and nephrectomy - Paul [name] GERD Hypertension Dyslipidemia    Plan:  Dr. Ninfa Linden is aware and will follow as needed.  Please call if we can assist.    Paul [name] 12/10/2017, 1:46 PM

## 2017-12-10 NOTE — Telephone Encounter (Signed)
Left message on machine to call back  

## 2017-12-10 NOTE — Progress Notes (Signed)
EUS with cyst gastrostomy using Axios stent is set for tomorrow 1300 with Dr Rush Landmark.    Azucena Freed PA-C [phone].

## 2017-12-11 ENCOUNTER — Encounter (HOSPITAL_COMMUNITY)
Admission: EM | Disposition: A | Discharge status: Home Health | Payer: Self-pay | Source: Home / Self Care | Attending: Internal Medicine

## 2017-12-11 ENCOUNTER — Inpatient Hospital Stay (HOSPITAL_COMMUNITY): Payer: 59 | Admitting: Certified Registered"

## 2017-12-11 ENCOUNTER — Encounter (HOSPITAL_COMMUNITY): Payer: Self-pay | Admitting: *Deleted

## 2017-12-11 DIAGNOSIS — K8689 Other specified diseases of pancreas: Secondary | ICD-10-CM

## 2017-12-11 DIAGNOSIS — K269 Duodenal ulcer, unspecified as acute or chronic, without hemorrhage or perforation: Secondary | ICD-10-CM

## 2017-12-11 DIAGNOSIS — K2970 Gastritis, unspecified, without bleeding: Secondary | ICD-10-CM

## 2017-12-11 DIAGNOSIS — K862 Cyst of pancreas: Secondary | ICD-10-CM

## 2017-12-11 DIAGNOSIS — K859 Acute pancreatitis without necrosis or infection, unspecified: Secondary | ICD-10-CM

## 2017-12-11 DIAGNOSIS — K3189 Other diseases of stomach and duodenum: Secondary | ICD-10-CM

## 2017-12-11 HISTORY — PX: BIOPSY: SHX5522

## 2017-12-11 HISTORY — PX: ESOPHAGOGASTRODUODENOSCOPY (EGD) WITH PROPOFOL: SHX5813

## 2017-12-11 HISTORY — PX: UPPER ESOPHAGEAL ENDOSCOPIC ULTRASOUND (EUS): SHX6562

## 2017-12-11 LAB — BASIC METABOLIC PANEL
ANION GAP: 7 (ref 5–15)
BUN: <5 mg/dL — ABNORMAL LOW (ref 6–20)
CO2: 23 mmol/L (ref 22–32)
CREATININE: 1 mg/dL (ref 0.61–1.24)
Calcium: 8.5 mg/dL — ABNORMAL LOW (ref 8.9–10.3)
Chloride: 107 mmol/L (ref 98–111)
Glucose, Bld: 121 mg/dL — ABNORMAL HIGH (ref 70–99)
Potassium: 3.7 mmol/L (ref 3.5–5.1)
SODIUM: 137 mmol/L (ref 135–145)

## 2017-12-11 LAB — CBC
HCT: 29.8 % — ABNORMAL LOW (ref 39.0–52.0)
Hemoglobin: 9.6 g/dL — ABNORMAL LOW (ref 13.0–17.0)
MCH: 27.6 pg (ref 26.0–34.0)
MCHC: 32.2 g/dL (ref 30.0–36.0)
MCV: 85.6 fL (ref 78.0–100.0)
Platelets: 190 10*3/uL (ref 150–400)
RBC: 3.48 MIL/uL — ABNORMAL LOW (ref 4.22–5.81)
RDW: 13.2 % (ref 11.5–15.5)
WBC: 4.3 10*3/uL (ref 4.0–10.5)

## 2017-12-11 LAB — VITAMIN B12: VITAMIN B 12: 1337 pg/mL — AB (ref 180–914)

## 2017-12-11 LAB — GLUCOSE, CAPILLARY
GLUCOSE-CAPILLARY: 123 mg/dL — AB (ref 70–99)
Glucose-Capillary: 102 mg/dL — ABNORMAL HIGH (ref 70–99)
Glucose-Capillary: 106 mg/dL — ABNORMAL HIGH (ref 70–99)

## 2017-12-11 LAB — RETICULOCYTES
RBC.: 3.48 MIL/uL — AB (ref 4.22–5.81)
RETIC COUNT ABSOLUTE: 34.8 10*3/uL (ref 19.0–186.0)
Retic Ct Pct: 1 % (ref 0.4–3.1)

## 2017-12-11 LAB — IRON AND TIBC
Iron: 34 ug/dL — ABNORMAL LOW (ref 45–182)
Saturation Ratios: 19 % (ref 17.9–39.5)
TIBC: 178 ug/dL — ABNORMAL LOW (ref 250–450)
UIBC: 144 ug/dL

## 2017-12-11 LAB — FERRITIN: Ferritin: 862 ng/mL — ABNORMAL HIGH (ref 24–336)

## 2017-12-11 LAB — FOLATE: FOLATE: 13.4 ng/mL (ref >5.9)

## 2017-12-11 SURGERY — UPPER ESOPHAGEAL ENDOSCOPIC ULTRASOUND (EUS)
Anesthesia: General

## 2017-12-11 MED ORDER — MIDAZOLAM HCL 2 MG/2ML IJ SOLN
INTRAMUSCULAR | Status: DC | PRN
  Administered 2017-12-11: 1 mg via INTRAVENOUS

## 2017-12-11 MED ORDER — PANTOPRAZOLE SODIUM 40 MG PO TBEC
40.0000 mg | DELAYED_RELEASE_TABLET | Freq: Every day | ORAL | Status: DC
  Administered 2017-12-12 – 2017-12-17 (×4): 40 mg via ORAL
  Filled 2017-12-11 (×5): qty 1

## 2017-12-11 MED ORDER — PHENYLEPHRINE 40 MCG/ML (10ML) SYRINGE FOR IV PUSH (FOR BLOOD PRESSURE SUPPORT)
PREFILLED_SYRINGE | INTRAVENOUS | Status: DC | PRN
  Administered 2017-12-11 (×2): 40 ug via INTRAVENOUS
  Administered 2017-12-11: 80 ug via INTRAVENOUS
  Administered 2017-12-11: 40 ug via INTRAVENOUS
  Administered 2017-12-11: 80 ug via INTRAVENOUS
  Administered 2017-12-11: 40 ug via INTRAVENOUS
  Administered 2017-12-11: 80 ug via INTRAVENOUS

## 2017-12-11 MED ORDER — ONDANSETRON HCL 4 MG/2ML IJ SOLN
INTRAMUSCULAR | Status: DC | PRN
  Administered 2017-12-11: 4 mg via INTRAVENOUS

## 2017-12-11 MED ORDER — ROCURONIUM BROMIDE 10 MG/ML (PF) SYRINGE
PREFILLED_SYRINGE | INTRAVENOUS | Status: DC | PRN
  Administered 2017-12-11 (×2): 10 mg via INTRAVENOUS
  Administered 2017-12-11: 40 mg via INTRAVENOUS

## 2017-12-11 MED ORDER — PROPOFOL 10 MG/ML IV BOLUS
INTRAVENOUS | Status: DC | PRN
  Administered 2017-12-11: 180 mg via INTRAVENOUS

## 2017-12-11 MED ORDER — FENTANYL CITRATE (PF) 100 MCG/2ML IJ SOLN
INTRAMUSCULAR | Status: DC | PRN
  Administered 2017-12-11: 50 ug via INTRAVENOUS

## 2017-12-11 MED ORDER — SUCCINYLCHOLINE CHLORIDE 20 MG/ML IJ SOLN
INTRAMUSCULAR | Status: DC | PRN
  Administered 2017-12-11: 120 mg via INTRAVENOUS

## 2017-12-11 MED ORDER — SUGAMMADEX SODIUM 200 MG/2ML IV SOLN
INTRAVENOUS | Status: DC | PRN
  Administered 2017-12-11: 160 mg via INTRAVENOUS

## 2017-12-11 MED ORDER — IOPAMIDOL (ISOVUE-300) INJECTION 61%
INTRAVENOUS | Status: AC
  Filled 2017-12-11: qty 100

## 2017-12-11 MED ORDER — GLYCOPYRROLATE PF 0.2 MG/ML IJ SOSY
PREFILLED_SYRINGE | INTRAMUSCULAR | Status: DC | PRN
  Administered 2017-12-11: .2 mg via INTRAVENOUS

## 2017-12-11 MED ORDER — LACTATED RINGERS IV SOLN
INTRAVENOUS | Status: DC | PRN
  Administered 2017-12-11: 13:00:00 via INTRAVENOUS

## 2017-12-11 MED ORDER — LIDOCAINE 2% (20 MG/ML) 5 ML SYRINGE
INTRAMUSCULAR | Status: DC | PRN
  Administered 2017-12-11: 100 mg via INTRAVENOUS

## 2017-12-11 NOTE — Anesthesia Preprocedure Evaluation (Addendum)
Anesthesia Evaluation  Patient identified by MRN, date of birth, ID band Patient awake    Reviewed: Allergy & Precautions, NPO status , Patient's Chart, lab work & pertinent test results  Airway Mallampati: I       Dental no notable dental hx. (+) Teeth Intact, Missing, Dental Advisory Given,    Pulmonary former smoker,    Pulmonary exam normal breath sounds clear to auscultation       Cardiovascular hypertension, Pt. on medications Normal cardiovascular exam Rhythm:Regular Rate:Normal     Neuro/Psych    GI/Hepatic   Endo/Other    Renal/GU      Musculoskeletal   Abdominal Normal abdominal exam  (+)   Peds  Hematology  (+) anemia ,   Anesthesia Other Findings   Reproductive/Obstetrics                            Anesthesia Physical Anesthesia Plan  ASA: III  Anesthesia Plan: General   Post-op Pain Management:    Induction: Intravenous  PONV Risk Score and Plan:   Airway Management Planned: Oral ETT  Additional Equipment:   Intra-op Plan:   Post-operative Plan: Extubation in OR  Informed Consent: I have reviewed the patients History and Physical, chart, labs and discussed the procedure including the risks, benefits and alternatives for the proposed anesthesia with the patient or authorized representative who has indicated his/her understanding and acceptance.   Dental advisory given  Plan Discussed with: CRNA and Surgeon  Anesthesia Plan Comments:         Anesthesia Quick Evaluation

## 2017-12-11 NOTE — Progress Notes (Signed)
    [facility] for Infectious Disease    Date of Admission:  12/08/2017   Total days of antibiotics 4 meropenem           ID: Paul [name] is a 37 y.o. 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 Denies fever. Awaiting EUS procedure for pancreatic pseudocyst-to-stomach stenting to decompress pseudocyst Medications:  . allopurinol  100 mg Oral BID  . atenolol  50 mg Oral Daily  . insulin aspart  0-9 Units Subcutaneous TID WC  . vitamin B-12  1,000 mcg Oral Daily    Objective: Vital signs in last 24 hours: Temp:  [97.9 F (36.6 C)-100.5 F (38.1 C)] 97.9 F (36.6 C) (09/13 1501) Pulse Rate:  [52-76] 72 (09/13 1530) Resp:  [6-20] 15 (09/13 1530) BP: (120-147)/(74-92) 121/91 (09/13 1530) SpO2:  [96 %-99 %] 97 % (09/13 1530) Physical Exam  Constitutional: He is oriented to person, place, and time. He appears well-developed and well-nourished. No distress.  HENT:  Mouth/Throat: Oropharynx is clear and moist. No oropharyngeal exudate.  Cardiovascular: Normal rate, regular rhythm and normal heart sounds. Exam reveals no gallop and no friction rub.  No murmur heard.  Pulmonary/Chest: Effort normal and breath sounds normal. No respiratory distress. He has no wheezes.  Abdominal: Soft. Bowel sounds are normal. He exhibits no distension. There is no tenderness.  Skin: Skin is warm and dry. No rash noted. No erythema.  Psychiatric: He has a normal mood and affect. His behavior is normal.    Lab Results Recent Labs    12/10/17 0618 12/11/17 0255  WBC 5.4 4.3  HGB 9.3* 9.6*  HCT 29.0* 29.8*  NA 135 137  K 3.2* 3.7  CL 107 107  CO2 19* 23  BUN 8 <5*  CREATININE 1.10 1.00   Liver Panel Recent Labs    12/09/17 0313 12/10/17 0618  PROT 6.1* 5.6*    ALBUMIN 2.9* 2.4*  AST 34 26  ALT 38 31  ALKPHOS 97 88  BILITOT 2.0* 1.3*  BILIDIR 0.7*  --   IBILI 1.3*  --     Microbiology: Negative blood cx Studies/Results: No results found.   Assessment/Plan: Currently on meropenem. Consider short course of 5-7days. May not necessarily need abtx after stent placement. If unable to get stent, will discuss if can do oral regimen  Dr Lucianne Lei dam to see tomorrow  [facility] for Infectious Diseases Cell: [phone] Pager: [phone]  12/11/2017, 4:29 PM

## 2017-12-11 NOTE — Interval H&P Note (Signed)
History and Physical Interval Note:  12/11/2017 12:36 PM  Paul [name]  has presented today for surgery, with the diagnosis of necrotizing pancreatitis, fevers  The various methods of treatment have been discussed with the patient and family. After consideration of risks, benefits and other options for treatment, the patient has consented to  Procedure(s): UPPER ESOPHAGEAL ENDOSCOPIC ULTRASOUND (EUS) (N/A) PANCREATIC STENT PLACEMENT (N/A) as a surgical intervention .  The patient's history has been reviewed, patient examined, no change in status, stable for surgery.  I have reviewed the patient's chart and labs.  Questions were answered to the patient's satisfaction.    The risks of EUS including bleeding, infection, aspiration pneumonia and intestinal perforation were discussed as was the possibility it may not give a definitive diagnosis.  If a biopsy of the pancreas is done as part of the EUS, there is an additional risk of pancreatitis at the rate of about 1%.  It was explained that procedure related pancreatitis is typically mild, although can be severe and even life threatening, which is why we do not perform random pancreatic biopsies and only biopsy a lesion we feel is concerning enough to warrant the risk.  The risks and benefits of endoscopic evaluation were discussed with the patient; these include but are not limited to the risk of perforation, infection, bleeding, missed lesions, lack of diagnosis, severe illness requiring hospitalization, as well as anesthesia and sedation related illnesses.  The patient is agreeable to proceed.   In the setting of potential cystgastrostomy creation there have been noted increased risk of bleeding (including life-threatening bleeding), pancreatitis, perforation/surgical need that have been noted in the literature.  I have thoroughly discussed this with the patient and his wife and his mother-in-law.  Dependent on the EUS findings, if we feel it is safe to  proceed with creation of cystgastrostomy then will pursue vs sampling of the fluid via EUS FNA if not yet mature or too unstable of a region to proceed.   [employer]

## 2017-12-11 NOTE — Anesthesia Procedure Notes (Signed)
Procedure Name: Intubation Date/Time: 12/11/2017 1:11 PM Performed by: Barrington Ellison, CRNA Pre-anesthesia Checklist: Patient identified, Emergency Drugs available, Suction available and Patient being monitored Patient Re-evaluated:Patient Re-evaluated prior to induction Oxygen Delivery Method: Circle System Utilized Preoxygenation: Pre-oxygenation with 100% oxygen Induction Type: IV induction Ventilation: Mask ventilation without difficulty Laryngoscope Size: Mac and 4 Grade View: Grade I Tube type: Oral Tube size: 7.5 mm Number of attempts: 1 Airway Equipment and Method: Stylet and Oral airway Placement Confirmation: ETT inserted through vocal cords under direct vision,  positive ETCO2 and breath sounds checked- equal and bilateral Secured at: 22 cm Tube secured with: Tape Dental Injury: Teeth and Oropharynx as per pre-operative assessment

## 2017-12-11 NOTE — Op Note (Signed)
[facility] Patient Name: Paul [name] Procedure Date : 12/11/2017 MRN: [id number] Attending MD: Justice Britain , MD Date of Birth: June 29, 1980 CSN: [id number] Age: 37 Admit Type: Inpatient Procedure:                Upper EUS Indications:              Pancreatic cyst on CT scan, Abnormal                            abdominal/pelvic CT scan, Acute pancreatitis,                            Pancreatic cyst, Pancreatic necrosis Providers:                Justice Britain, MD, Kingsley Plan, RN, Cletis Athens, Technician Referring MD:             Gerrit Heck, MD Medicines:                Monitored Anesthesia Care Complications:            No immediate complications. Estimated Blood Loss:     Estimated blood loss was minimal. Procedure:                Pre-Anesthesia Assessment:                           - Prior to the procedure, a History and Physical                            was performed, and patient medications and                            allergies were reviewed. The patient's tolerance of                            previous anesthesia was also reviewed. The risks                            and benefits of the procedure and the sedation                            options and risks were discussed with the patient.                            All questions were answered, and informed consent                            was obtained. Prior Anticoagulants: The patient has                            taken no previous anticoagulant or antiplatelet                            agents.  ASA Grade Assessment: III - A patient with                            severe systemic disease. After reviewing the risks                            and benefits, the patient was deemed in                            satisfactory condition to undergo the procedure.                           After obtaining informed consent, the endoscope was   passed under direct vision. Throughout the                            procedure, the patient's blood pressure, pulse, and                            oxygen saturations were monitored continuously. The                            [id number]-[id number] ([id number]) Olympus Adult EGD was introduced                            through the mouth, and advanced to the second part                            of duodenum. The [id number]-[id number] ([id number]) Olympus ERCP                            was introduced through the mouth, and advanced to                            the second part of duodenum. The [id number]                            ([id number]) Olympus Linear EUS was introduced through                            the mouth, and advanced to the duodenum for                            ultrasound examination from the stomach and                            duodenum. The upper EUS was technically difficult                            and complex. Successful completion of the procedure                            was aided by performing the maneuvers documented                            (  below) in this report. The patient tolerated the                            procedure. Scope In: Scope Out: Findings:      ENDOSCOPIC FINDING: :      No gross lesions were noted in the entire esophagus.      The Z-line was regular and was found 41 cm from the incisors.      Patchy moderately erythematous mucosa was found in the entire examined       stomach. Biopsies were taken with a cold forceps for histology and       Helicobacter pylori testing.      A moderate extrinsic deformity was found in the duodenal bulb, in the       first portion of the duodenum and in the second portion of the duodenum.      Patchy moderately erythematous mucosa without active bleeding and with       no stigmata of bleeding was found in the duodenal bulb, in the first       portion of the duodenum and in the second portion of the duodenum.       Biopsies were  taken with a cold forceps for histology.      One duodenal ulcer was found in the first portion of the duodenum. The       lesion was 6 mm in largest dimension.      ENDOSONOGRAPHIC FINDING: :      A septated lesion suggestive of a pancreatic cyst/walled-off necrosis       was identified in the peripancreatic region. It is not in obvious       communication with the pancreatic duct. The lesion measured 85 mm by 48       mm in maximal cross-sectional diameter. Multiple thinly septated. The       outer wall of the lesion was thin. There was no associated mass. There       was internal debris within the fluid-filled cavity. Multiple attempts       and windows were looked at in attempt of trying to proceed with       cystgastrostomy creation. At one point, there was felt to be a potential       window, however, the fat plane between the wall of the stomach and the       cyst wall was not felt to be short enough for appropriate placement of       the stent (between 7-8 mm was the fat plane). Decision was made to hold       off on ostomy creation. The walled off necrosis could be seen in the       region of the pylorus within 1 cm and within the duodenal bulb but       within a few centimeters, and was not felt to be an adequate place for       placement as of yet.      Endosonographic imaging in the visualized portion of the liver showed no       mass. Impression:               EGD Impression:                           - No gross lesions in esophagus.                           -  Z-line regular, 41 cm from the incisors.                           - Erythematous mucosa in the stomach. Biopsied.                           - Duodenal deformity.                           - Erythematous duodenopathy. Biopsied.                           - One duodenal ulcer.                           EUS Impression:                           - A cystic lesion was seen in the peripancreatic                             region. Tissue has not been obtained. However, the                            endosonographic appearance is consistent with a                            pancreatic walled off necrosis. Attempt to find a                            window for endoscopic drainage was not possible due                            to the fat plane between the gastric wall and the                            cyst-wall cavity. Recommendation:           - The patient will be observed post-procedure,                            until all discharge criteria are met.                           - Return patient to hospital ward for ongoing care.                           - Observe patient's clinical course.                           - Follow up Pathology results.                           - Plan for attempt with IR to place Percutaneous  drain and sample fluid for culture.                           - Dependent on how patient does and with drain in                            place, if there remains a large region in the next                            2-3 weeks that requires drainage may consider                            repeat attempt at [facility] placement in the future.                           - The findings and recommendations were discussed                            with the patient.                           - The findings and recommendations were discussed                            with the patient's family.                           - The findings and recommendations were discussed                            with the referring physician. Procedure Code(s):        --- Professional ---                           [phone], Esophagogastroduodenoscopy, flexible,                            transoral; with endoscopic ultrasound examination                            limited to the esophagus, stomach or duodenum, and                            adjacent structures Diagnosis Code(s):        ---  Professional ---                           K31.89, Other diseases of stomach and duodenum                           K26.9, Duodenal ulcer, unspecified as acute or                            chronic, without hemorrhage or perforation  K86.2, Cyst of pancreas                           K85.90, Acute pancreatitis without necrosis or                            infection, unspecified                           K86.89, Other specified diseases of pancreas                           R93.5, Abnormal findings on diagnostic imaging of                            other abdominal regions, including retroperitoneum CPT copyright 2017 [employer]. All rights reserved. The codes documented in this report are preliminary and upon coder review may  be revised to meet current compliance requirements. Justice Britain, MD 12/11/2017 3:11:25 PM Number of Addenda: 0

## 2017-12-11 NOTE — Transfer of Care (Signed)
Immediate Anesthesia Transfer of Care Note  Patient: Paul [name]  Procedure(s) Performed: UPPER ESOPHAGEAL ENDOSCOPIC ULTRASOUND (EUS) (N/A ) ESOPHAGOGASTRODUODENOSCOPY (EGD) WITH PROPOFOL (N/A ) BIOPSY  Patient Location: Endoscopy Unit  Anesthesia Type:General  Level of Consciousness: awake, alert  and oriented  Airway & Oxygen Therapy: Patient Spontanous Breathing and Patient connected to nasal cannula oxygen  Post-op Assessment: Report given to RN  Post vital signs: Reviewed and stable  Last Vitals:  Vitals Value Taken Time  BP    Temp    Pulse 83 12/11/2017  3:00 PM  Resp 14 12/11/2017  3:00 PM  SpO2 97 % 12/11/2017  3:00 PM  Vitals shown include unvalidated device data.  Last Pain:  Vitals:   12/11/17 1225  TempSrc: Oral  PainSc: 0-No pain         Complications: No apparent anesthesia complications

## 2017-12-11 NOTE — Progress Notes (Signed)
PROGRESS NOTE    Paul [name]  YSA:[id number] DOB: February 28, 1981 DOA: 12/08/2017 PCP: Karleen Hampshire., MD    Brief Narrative:Paul [name] is a 37 y.o. male with history of renal cell carcinoma status post left-sided nephrectomy in 2012 who was admitted for idiopathic pancreatitis last month and had necrotizing pancreatitis eventually discharged home on IV antibiotics last dose was on December 02, 2017 last week presents to the ER with complaints of fever and chills for last 2 days.  Denies any nausea vomiting abdominal pain diarrhea able to eat well denies any chest pain productive cough headache visual symptoms.  Patient also was discharged on insulin for hyperglycemia which patient states has improved and has stopped using long-acting insulin but last 2 days his blood sugar started increasing again.  ED Course: In the ER patient had a fever of 102 F chest x-ray and urine were unremarkable.  CT of the abdomen shows features of necrotizing pancreatitis with no definite abscess.  Blood cultures are obtained started on fluid per sepsis protocol and admitted for possible developing sepsis.  Likely source could be intra-abdominal.     Assessment & Plan:   Principal Problem:   Sepsis ([place]) Active Problems:   Essential hypertension   Necrotizing pancreatitis   Hyperglycemia   Pancreatic abscess  1-Sepsis; presents with fevers, leukocytosis. Source of infection probably necrotizing pancreatitis.  On meropenem UA negative Blood culture. No growth to date. In 48 hours.  Elevated lactic acid, pro-calcitonin level.  NSL.  Mild fever this am.   2-Necrotizing pancreatitis;  CT shows inflammatory changes. GI consulted.  Will consult ID.  Continue with meropenem.  Mild elevation of bilirubin.  Bili trending down,  Plan for EUS stent , drainage fluid 9-13 Denies worsening abdominal pain.   3-HTN; hold Norvasc. Continue with atenolol.   4-Hyperglycemia; Continue with SSI. Might need long acting.    History of nephrectomy for left-sided renal cell carcinoma in 2012.  Hypokalemia; replete orally and IV.   Anemia; no melena. Iron deficiency.  Check occult blood.  Need iron pills at discharge   Hypoxemia; transiently. NSL  Incentive spirometry   DVT prophylaxis: SCD.  Code Status: full code.  Family Communication: care discussed with patient  Disposition Plan: remain inpatient for IV antibiotics.    Consultants:   GI   ID   Procedures:  \none  Antimicrobials: meropenem.   Subjective: Awaiting for procedure. Denies worsening abdominal pain.   Objective: Vitals:   12/10/17 1712 12/10/17 2327 12/11/17 0500 12/11/17 0717  BP: 122/84 129/87  120/74  Pulse: 75 76  61  Resp: 15 15  (!) 6  Temp: 99.7 F (37.6 C) (!) 100.5 F (38.1 C) (!) 100.5 F (38.1 C) 100 F (37.8 C)  TempSrc: Oral Oral Oral Oral  SpO2: 98% 98%  96%  Weight:      Height:        Intake/Output Summary (Last 24 hours) at 12/11/2017 0802 Last data filed at 12/10/2017 1600 Gross per 24 hour  Intake 412.07 ml  Output -  Net 412.07 ml   Filed Weights   12/09/17 0302  Weight: 77.1 kg    Examination:  General exam: NAD Respiratory system: CTA Cardiovascular system: S 1, S 2 RRR Gastrointestinal system: BS present, soft, nt Central nervous system: Non focal.  Extremities: Symmetric power.  Skin: no rashes.    Data Reviewed: I have personally reviewed following labs and imaging studies  CBC: Recent Labs  Lab 12/08/17 1659 12/09/17 0313 12/10/17 3235  12/11/17 0255  WBC 13.5* 7.3 5.4 4.3  NEUTROABS  --  4.9  --   --   HGB 11.8* 10.6* 9.3* 9.6*  HCT 36.6* 33.1* 29.0* 29.8*  MCV 86.9 85.8 85.8 85.6  PLT 235 162 165 517   Basic Metabolic Panel: Recent Labs  Lab 12/08/17 1659 12/09/17 0313 12/10/17 0618 12/11/17 0255  NA 133* 136 135 137  K 4.0 3.6 3.2* 3.7  CL 98 105 107 107  CO2 21* 20* 19* 23  GLUCOSE 220* 167* 133* 121*  BUN 8 8 8  <5*  CREATININE 1.17 1.17 1.10  1.00  CALCIUM 9.5 8.5* 7.9* 8.5*   GFR: Estimated Creatinine Clearance: 105.4 mL/min (by C-G formula based on SCr of 1 mg/dL). Liver Function Tests: Recent Labs  Lab 12/08/17 1659 12/09/17 0313 12/10/17 0618  AST 42* 34 26  ALT 41 38 31  ALKPHOS 113 97 88  BILITOT 1.5* 2.0* 1.3*  PROT 7.2 6.1* 5.6*  ALBUMIN 3.4* 2.9* 2.4*   Recent Labs  Lab 12/08/17 1659  LIPASE 41   No results for input(s): AMMONIA in the last 168 hours. Coagulation Profile: Recent Labs  Lab 12/09/17 0313  INR 1.37   Cardiac Enzymes: No results for input(s): CKTOTAL, CKMB, CKMBINDEX, TROPONINI in the last 168 hours. BNP (last 3 results) No results for input(s): PROBNP in the last 8760 hours. HbA1C: No results for input(s): HGBA1C in the last 72 hours. CBG: Recent Labs  Lab 12/10/17 0730 12/10/17 1133 12/10/17 1635 12/10/17 2058 12/11/17 0716  GLUCAP 124* 186* 136* 162* 123*   Lipid Profile: No results for input(s): CHOL, HDL, LDLCALC, TRIG, CHOLHDL, LDLDIRECT in the last 72 hours. Thyroid Function Tests: No results for input(s): TSH, T4TOTAL, FREET4, T3FREE, THYROIDAB in the last 72 hours. Anemia Panel: Recent Labs    12/11/17 0255  VITAMINB12 1,337*  FOLATE 13.4  FERRITIN 862*  TIBC 178*  IRON 34*  RETICCTPCT 1.0   Sepsis Labs: Recent Labs  Lab 12/08/17 1719 12/08/17 2011 12/09/17 0313 12/09/17 0615  PROCALCITON  --   --  1.29  --   LATICACIDVEN 1.96* 2.56* 1.9 1.1    Recent Results (from the past 240 hour(s))  Culture, blood (routine x 2)     Status: None (Preliminary result)   Collection Time: 12/08/17 11:59 PM  Result Value Ref Range Status   Specimen Description BLOOD LEFT ANTECUBITAL  Final   Special Requests   Final    BOTTLES DRAWN AEROBIC AND ANAEROBIC Blood Culture adequate volume   Culture   Final    NO GROWTH 2 DAYS Performed at [facility], [place] [address] [place], [place] [address]    Report Status PENDING  Incomplete  Culture, blood (routine x  2)     Status: None (Preliminary result)   Collection Time: 12/09/17 12:00 AM  Result Value Ref Range Status   Specimen Description BLOOD RIGHT ANTECUBITAL  Final   Special Requests   Final    BOTTLES DRAWN AEROBIC AND ANAEROBIC Blood Culture results may not be optimal due to an inadequate volume of blood received in culture bottles   Culture   Final    NO GROWTH 2 DAYS Performed at [facility], [place] [address], [place], [place] [address]    Report Status PENDING  Incomplete  MRSA PCR Screening     Status: None   Collection Time: 12/09/17  5:45 AM  Result Value Ref Range Status   MRSA by PCR NEGATIVE NEGATIVE Final  Comment:        The GeneXpert MRSA Assay (FDA approved for NASAL specimens only), is one component of a comprehensive MRSA colonization surveillance program. It is not intended to diagnose MRSA infection nor to guide or monitor treatment for MRSA infections. Performed at [facility], [place] [address] [place], [place] [address]          Radiology Studies: No results found.      Scheduled Meds: . allopurinol  100 mg Oral BID  . atenolol  50 mg Oral Daily  . insulin aspart  0-9 Units Subcutaneous TID WC  . vitamin B-12  1,000 mcg Oral Daily   Continuous Infusions: . meropenem (MERREM) IV 1 g (12/11/17 0515)     LOS: 3 days    Time spent: 35 minutes.     Elmarie Shiley, MD [facility] Pager [phone]  If 7PM-7AM, please contact night-coverage [email or web address] Password [email or web address] 12/11/2017, 8:02 AM

## 2017-12-11 NOTE — Progress Notes (Signed)
Initial Nutrition Assessment  DOCUMENTATION CODES:   Not applicable  INTERVENTION:    Advance diet as medically appropriate; add supplements when/as able  NUTRITION DIAGNOSIS:   Increased nutrient needs related to acute illness(necrotizing pancreatitis) as evidenced by estimated needs  GOAL:   Patient will meet greater than or equal to 90% of their needs  MONITOR:   Diet advancement, PO intake, Supplement acceptance, Labs, Skin, Weight trends, I & O's  REASON FOR ASSESSMENT:   Malnutrition Screening Tool  ASSESSMENT:   37 y.o. Male who was admitted for idiopathic pancreatitis last month and had necrotizing pancreatitis eventually discharged home on IV antibiotics last dose was on December 02, 2017 last week presents to the ER with complaints of fever and chills.   Pt admitted with necrotizing pancreatitis and sepsis.  RD spoke with patient at bedside. His Mother is present. Pt reports he was eating well PTA. Currently NPO for procedure. Prior to NPO status, pt was on a Full Liquid diet; tolerated fine.  Pt was recently hospitalized with acute pancreatitis beginning of 10/2017. States he lost some weight during this time. Feels like he's been gaining it back. He doesn't like Ensure supplements, however, agreeable to try Boost Breeze.  Medications include Vit B-12. CBG's [phone]. Labs reviewed.  NUTRITION - FOCUSED PHYSICAL EXAM:  Completed. No muscle or fat depletions noticed.  Diet Order:   Diet Order            Diet NPO time specified  Diet effective midnight             EDUCATION NEEDS:   No education needs have been identified at this time  Skin:  Skin Assessment: Reviewed RN Assessment  Last BM:  9/11  Height:   Ht Readings from Last 1 Encounters:  12/09/17 5\' 10"  (1.778 m)   Weight:   Wt Readings from Last 1 Encounters:  12/09/17 77.1 kg   BMI:  Body mass index is 24.39 kg/m.  Estimated Nutritional Needs:   Kcal:   2751-7001  Protein:  115-130 gm  Fluid:  2.3-2.5 L  Arthur Holms, RD, LDN Pager #: [phone] After-Hours Pager #: [phone]

## 2017-12-12 DIAGNOSIS — K8689 Other specified diseases of pancreas: Secondary | ICD-10-CM

## 2017-12-12 DIAGNOSIS — K863 Pseudocyst of pancreas: Secondary | ICD-10-CM

## 2017-12-12 LAB — CBC
HEMATOCRIT: 33.1 % — AB (ref 39.0–52.0)
HEMOGLOBIN: 10.8 g/dL — AB (ref 13.0–17.0)
MCH: 27.8 pg (ref 26.0–34.0)
MCHC: 32.6 g/dL (ref 30.0–36.0)
MCV: 85.1 fL (ref 78.0–100.0)
Platelets: 256 10*3/uL (ref 150–400)
RBC: 3.89 MIL/uL — ABNORMAL LOW (ref 4.22–5.81)
RDW: 13.3 % (ref 11.5–15.5)
WBC: 5.6 10*3/uL (ref 4.0–10.5)

## 2017-12-12 LAB — BASIC METABOLIC PANEL
Anion gap: 11 (ref 5–15)
BUN: 5 mg/dL — AB (ref 6–20)
CHLORIDE: 102 mmol/L (ref 98–111)
CO2: 22 mmol/L (ref 22–32)
CREATININE: 1.03 mg/dL (ref 0.61–1.24)
Calcium: 8.6 mg/dL — ABNORMAL LOW (ref 8.9–10.3)
GFR calc Af Amer: >60 mL/min (ref >60)
GFR calc non Af Amer: >60 mL/min (ref >60)
GLUCOSE: 138 mg/dL — AB (ref 70–99)
Potassium: 4.1 mmol/L (ref 3.5–5.1)
Sodium: 135 mmol/L (ref 135–145)

## 2017-12-12 LAB — GLUCOSE, CAPILLARY
GLUCOSE-CAPILLARY: 120 mg/dL — AB (ref 70–99)
Glucose-Capillary: 161 mg/dL — ABNORMAL HIGH (ref 70–99)
Glucose-Capillary: 122 mg/dL — ABNORMAL HIGH (ref 70–99)
Glucose-Capillary: 160 mg/dL — ABNORMAL HIGH (ref 70–99)

## 2017-12-12 MED ORDER — ATENOLOL 25 MG PO TABS
25.0000 mg | ORAL_TABLET | Freq: Every day | ORAL | Status: DC
  Administered 2017-12-13 – 2017-12-17 (×5): 25 mg via ORAL
  Filled 2017-12-12 (×5): qty 1

## 2017-12-12 MED ORDER — SODIUM CHLORIDE 0.9 % IV SOLN
3.0000 g | Freq: Four times a day (QID) | INTRAVENOUS | Status: DC
  Administered 2017-12-12 – 2017-12-17 (×20): 3 g via INTRAVENOUS
  Filled 2017-12-12 (×21): qty 3

## 2017-12-12 NOTE — Progress Notes (Signed)
Pharmacy Antibiotic Note  Paul [name] is a 37 y.o. male admitted on 12/08/2017 with severe necrotizing pancreatitis and sepsis.  Pharmacy has been consulted for Unasyn dosing.  Patient was previously admitted for idiopathic pancreatitis last month and had necrotizing pancreatitis eventually discharged home on IV antibiotics with last dose on December 01, 2017.   During this admission, patient was started on meropenem and is now being descatelated to unasyn.   Plan: Start Unasyn 3g q6 hours Monitor renal function and clinical improvement.   Height: 5\' 10"  (177.8 cm) Weight: 170 lb (77.1 kg) IBW/kg (Calculated) : 73  Temp (24hrs), Avg:98 F (36.7 C), Min:97.9 F (36.6 C), Max:98.2 F (36.8 C)  Recent Labs  Lab 12/08/17 1659 12/08/17 1719 12/08/17 2011 12/09/17 0313 12/09/17 0615 12/10/17 0618 12/11/17 0255 12/12/17 0850  WBC 13.5*  --   --  7.3  --  5.4 4.3 5.6  CREATININE 1.17  --   --  1.17  --  1.10 1.00 1.03  LATICACIDVEN  --  1.96* 2.56* 1.9 1.1  --   --   --     Estimated Creatinine Clearance: 102.4 mL/min (by C-G formula based on SCr of 1.03 mg/dL).    Allergies  Allergen Reactions  . Honey Diarrhea and Nausea And Vomiting    Antimicrobials this admission: 9/11 Zosyn x 1 9/11 vancomycin >>9/11 9/11 Merrem >>9/14 9/14 Unasyn >>   Microbiology results: 9/11 MRSA PCR negative 9/11 Bcx: NGTD 9/10 Bcx: NGTD  Thank you for allowing pharmacy to be a part of this patient's care.  Azzie Roup D PGY1 Pharmacy Resident  Phone [phone] Please use AMION for clinical pharmacists numbers  12/12/2017      11:25 AM

## 2017-12-12 NOTE — Progress Notes (Signed)
[facility] ROUNDING NOTE   Subjective: Attempted EUS with cystgastrostomy yesterday. Unfortunately, unable to gain adequate windows for stent placement, and therefore will plan for IR guided percutaneous drainage today. Otherwise, no acute events overnight. No fever.   Objective: Vital signs in last 24 hours: Temp:  [97.9 F (36.6 C)-98.2 F (36.8 C)] 98 F (36.7 C) (09/14 0806) Pulse Rate:  [46-72] 49 (09/14 0806) Resp:  [15-20] 18 (09/14 0806) BP: (121-147)/(79-92) 133/88 (09/14 0806) SpO2:  [97 %-99 %] 97 % (09/14 0806) Last BM Date: 12/09/17 General: NAD Abdomen: Soft, NT, ND Ext: No c/c/e    Intake/Output from previous day: 09/13 0701 - 09/14 0700 In: 900 [I.V.:700; IV Piggyback:200] Out: -  Intake/Output this shift: No intake/output data recorded.   Lab Results: Recent Labs    12/10/17 0618 12/11/17 0255 12/12/17 0850  WBC 5.4 4.3 5.6  HGB 9.3* 9.6* 10.8*  PLT 165 190 256  MCV 85.8 85.6 85.1   BMET Recent Labs    12/10/17 0618 12/11/17 0255 12/12/17 0850  NA 135 137 135  K 3.2* 3.7 4.1  CL 107 107 102  CO2 19* 23 22  GLUCOSE 133* 121* 138*  BUN 8 <5* 5*  CREATININE 1.10 1.00 1.03  CALCIUM 7.9* 8.5* 8.6*   LFT Recent Labs    12/10/17 0618  PROT 5.6*  ALBUMIN 2.4*  AST 26  ALT 31  ALKPHOS 88  BILITOT 1.3*   PT/INR No results for input(s): INR in the last 72 hours.    Imaging/Other results: No results found.    Assessment and Recommendations: 1) Pancreatic Pseudocyst/Necrotizing pancreatitis: - Resume Abx for now - IR guided perc drain today with fluid sampling for culture and sensitivies - Blood cxs with no growth to date - General Surgery service aware and have seen patient     Lavena Bullion, DO  12/12/2017, 10:23 AM [facility] Pager [phone]

## 2017-12-12 NOTE — Progress Notes (Signed)
Stool hemoccult negative.

## 2017-12-12 NOTE — Progress Notes (Addendum)
PROGRESS NOTE    Paul [name]  HAL:[id number] DOB: December 31, 1980 DOA: 12/08/2017 PCP: Karleen Hampshire., MD    Brief Narrative:Paul [name] is a 37 y.o. male with history of renal cell carcinoma status post left-sided nephrectomy in 2012 who was admitted for idiopathic pancreatitis last month and had necrotizing pancreatitis eventually discharged home on IV antibiotics last dose was on December 02, 2017 last week presents to the ER with complaints of fever and chills for last 2 days.  Denies any nausea vomiting abdominal pain diarrhea able to eat well denies any chest pain productive cough headache visual symptoms.  Patient also was discharged on insulin for hyperglycemia which patient states has improved and has stopped using long-acting insulin but last 2 days his blood sugar started increasing again.  ED Course: In the ER patient had a fever of 102 F chest x-ray and urine were unremarkable.  CT of the abdomen shows features of necrotizing pancreatitis with no definite abscess.  Blood cultures are obtained started on fluid per sepsis protocol and admitted for possible developing sepsis.  Likely source could be intra-abdominal.     Assessment & Plan:   Principal Problem:   Sepsis ([place]) Active Problems:   Essential hypertension   Necrotizing pancreatitis   Hyperglycemia   Pancreatic abscess   Pancreatic necrosis  1-Sepsis; presents with fevers, leukocytosis. Source of infection probably necrotizing pancreatitis. Sepsis confirmed.  On meropenem-- ID  Recommend change antibiotics to Unasyn  UA negative Blood culture. No growth to date. Elevated lactic acid, pro-calcitonin level.  NSL.  Afebrile.  2-Necrotizing pancreatitis;  CT shows inflammatory changes. GI consulted.  Will consult ID.  Continue with meropenem.  Mild elevation of bilirubin.  Bili trending down,  Unable to get adequate window for stent during EUS.  Plan for IR to place percutaneous drain.    3-HTN; hold Norvasc.  Continue with atenolol.   4-Hyperglycemia; Continue with SSI. Might need long acting.   History of nephrectomy for left-sided renal cell carcinoma in 2012.  Hypokalemia; replaced.   Anemia; no melena. Iron deficiency.  Check occult blood. Negative  Need iron pills at discharge  Hb increasing   Hypoxemia; transiently. NSL  Incentive spirometry   Bradycardia; reduce dose of atenolol.   DVT prophylaxis: SCD.  Code Status: full code.  Family Communication: care discussed with patient  Disposition Plan: remain inpatient for IV antibiotics.    Consultants:   GI   ID   Procedures:  \none  Antimicrobials: meropenem.   Subjective: Denies worsening abdominal pain.  Wants to eat,  Awaiting procedure.   Objective: Vitals:   12/11/17 1510 12/11/17 1530 12/12/17 0109 12/12/17 0806  BP: (!) 147/92 (!) 121/91 131/79 133/88  Pulse: 69 72 (!) 46 (!) 49  Resp: 20 15 20 18   Temp:   98.2 F (36.8 C) 98 F (36.7 C)  TempSrc:   Oral Oral  SpO2: 97% 97% 97% 97%  Weight:      Height:        Intake/Output Summary (Last 24 hours) at 12/12/2017 1532 Last data filed at 12/12/2017 1200 Gross per 24 hour  Intake 200 ml  Output -  Net 200 ml   Filed Weights   12/09/17 0302  Weight: 77.1 kg    Examination:  General exam: NAD Respiratory system: CTA Cardiovascular system: S 1, S 2 RRR Gastrointestinal system: BS present, soft nt Central nervous system: non focal.  Extremities:  Symmetric power.  Skin: no rashes.    Data Reviewed: I  have personally reviewed following labs and imaging studies  CBC: Recent Labs  Lab 12/08/17 1659 12/09/17 0313 12/10/17 0618 12/11/17 0255 12/12/17 0850  WBC 13.5* 7.3 5.4 4.3 5.6  NEUTROABS  --  4.9  --   --   --   HGB 11.8* 10.6* 9.3* 9.6* 10.8*  HCT 36.6* 33.1* 29.0* 29.8* 33.1*  MCV 86.9 85.8 85.8 85.6 85.1  PLT 235 162 165 190 846   Basic Metabolic Panel: Recent Labs  Lab 12/08/17 1659 12/09/17 0313 12/10/17 0618  12/11/17 0255 12/12/17 0850  NA 133* 136 135 137 135  K 4.0 3.6 3.2* 3.7 4.1  CL 98 105 107 107 102  CO2 21* 20* 19* 23 22  GLUCOSE 220* 167* 133* 121* 138*  BUN 8 8 8  <5* 5*  CREATININE 1.17 1.17 1.10 1.00 1.03  CALCIUM 9.5 8.5* 7.9* 8.5* 8.6*   GFR: Estimated Creatinine Clearance: 102.4 mL/min (by C-G formula based on SCr of 1.03 mg/dL). Liver Function Tests: Recent Labs  Lab 12/08/17 1659 12/09/17 0313 12/10/17 0618  AST 42* 34 26  ALT 41 38 31  ALKPHOS 113 97 88  BILITOT 1.5* 2.0* 1.3*  PROT 7.2 6.1* 5.6*  ALBUMIN 3.4* 2.9* 2.4*   Recent Labs  Lab 12/08/17 1659  LIPASE 41   No results for input(s): AMMONIA in the last 168 hours. Coagulation Profile: Recent Labs  Lab 12/09/17 0313  INR 1.37   Cardiac Enzymes: No results for input(s): CKTOTAL, CKMB, CKMBINDEX, TROPONINI in the last 168 hours. BNP (last 3 results) No results for input(s): PROBNP in the last 8760 hours. HbA1C: No results for input(s): HGBA1C in the last 72 hours. CBG: Recent Labs  Lab 12/11/17 1209 12/11/17 1639 12/11/17 2107 12/12/17 0803 12/12/17 1111  GLUCAP 102* 106* 161* 122* 120*   Lipid Profile: No results for input(s): CHOL, HDL, LDLCALC, TRIG, CHOLHDL, LDLDIRECT in the last 72 hours. Thyroid Function Tests: No results for input(s): TSH, T4TOTAL, FREET4, T3FREE, THYROIDAB in the last 72 hours. Anemia Panel: Recent Labs    12/11/17 0255  VITAMINB12 1,337*  FOLATE 13.4  FERRITIN 862*  TIBC 178*  IRON 34*  RETICCTPCT 1.0   Sepsis Labs: Recent Labs  Lab 12/08/17 1719 12/08/17 2011 12/09/17 0313 12/09/17 0615  PROCALCITON  --   --  1.29  --   LATICACIDVEN 1.96* 2.56* 1.9 1.1    Recent Results (from the past 240 hour(s))  Culture, blood (routine x 2)     Status: None (Preliminary result)   Collection Time: 12/08/17 11:59 PM  Result Value Ref Range Status   Specimen Description BLOOD LEFT ANTECUBITAL  Final   Special Requests   Final    BOTTLES DRAWN AEROBIC AND  ANAEROBIC Blood Culture adequate volume   Culture   Final    NO GROWTH 3 DAYS Performed at [facility], [place] [address], [place], [place] [address]    Report Status PENDING  Incomplete  Culture, blood (routine x 2)     Status: None (Preliminary result)   Collection Time: 12/09/17 12:00 AM  Result Value Ref Range Status   Specimen Description BLOOD RIGHT ANTECUBITAL  Final   Special Requests   Final    BOTTLES DRAWN AEROBIC AND ANAEROBIC Blood Culture results may not be optimal due to an inadequate volume of blood received in culture bottles   Culture   Final    NO GROWTH 3 DAYS Performed at [facility], [place] [address], [place], [place] [address]  Report Status PENDING  Incomplete  MRSA PCR Screening     Status: None   Collection Time: 12/09/17  5:45 AM  Result Value Ref Range Status   MRSA by PCR NEGATIVE NEGATIVE Final    Comment:        The GeneXpert MRSA Assay (FDA approved for NASAL specimens only), is one component of a comprehensive MRSA colonization surveillance program. It is not intended to diagnose MRSA infection nor to guide or monitor treatment for MRSA infections. Performed at [facility], [place] [address], [place], [place] [address]          Radiology Studies: No results found.      Scheduled Meds: . allopurinol  100 mg Oral BID  . [START ON 12/13/2017] atenolol  25 mg Oral Daily  . insulin aspart  0-9 Units Subcutaneous TID WC  . pantoprazole  40 mg Oral Q0600  . vitamin B-12  1,000 mcg Oral Daily   Continuous Infusions: . ampicillin-sulbactam (UNASYN) IV 3 g (12/12/17 1529)     LOS: 4 days    Time spent: 35 minutes.     Elmarie Shiley, MD [facility] Pager [phone]  If 7PM-7AM, please contact night-coverage [email or web address] Password [facility] 12/12/2017, 3:32 PM

## 2017-12-12 NOTE — Progress Notes (Signed)
Subjective: No new complaints   Antibiotics:  Anti-infectives (From admission, onward)   Start     Dose/Rate Route Frequency Ordered Stop   12/09/17 1200  vancomycin (VANCOCIN) IVPB 750 mg/150 ml premix  Status:  Discontinued     750 mg 150 mL/hr over 60 Minutes Intravenous Every 8 hours 12/09/17 0401 12/09/17 1142   12/09/17 0600  meropenem (MERREM) 1 g in sodium chloride 0.9 % 100 mL IVPB  Status:  Discontinued     1 g 200 mL/hr over 30 Minutes Intravenous Every 8 hours 12/09/17 0254 12/12/17 1020   12/09/17 0415  vancomycin (VANCOCIN) 1,500 mg in sodium chloride 0.9 % 500 mL IVPB     1,500 mg 250 mL/hr over 120 Minutes Intravenous  Once 12/09/17 0401 12/09/17 1905   12/08/17 2330  piperacillin-tazobactam (ZOSYN) IVPB 3.375 g     3.375 g 100 mL/hr over 30 Minutes Intravenous  Once 12/08/17 2323 12/09/17 0038      Medications: Scheduled Meds: . allopurinol  100 mg Oral BID  . [START ON 12/13/2017] atenolol  25 mg Oral Daily  . insulin aspart  0-9 Units Subcutaneous TID WC  . pantoprazole  40 mg Oral Q0600  . vitamin B-12  1,000 mcg Oral Daily   Continuous Infusions: PRN Meds:.acetaminophen **OR** acetaminophen, ibuprofen, ondansetron **OR** ondansetron (ZOFRAN) IV    Objective: Weight change:   Intake/Output Summary (Last 24 hours) at 12/12/2017 1036 Last data filed at 12/12/2017 0641 Gross per 24 hour  Intake 900 ml  Output -  Net 900 ml   Blood pressure 133/88, pulse (!) 49, temperature 98 F (36.7 C), temperature source Oral, resp. rate 18, height 5\' 10"  (1.778 m), weight 77.1 kg, SpO2 97 %. Temp:  [97.9 F (36.6 C)-98.2 F (36.8 C)] 98 F (36.7 C) (09/14 0806) Pulse Rate:  [46-72] 49 (09/14 0806) Resp:  [15-20] 18 (09/14 0806) BP: (121-147)/(79-92) 133/88 (09/14 0806) SpO2:  [97 %-99 %] 97 % (09/14 0806)  Physical Exam: General: Alert and awake, oriented x3, not in any acute distress. HEENT: anicteric sclera, EOMI CVS regular rate, normal    Chest: , no wheezing, no respiratory distress Abdomen: soft non-distended,  Extremities: no edema or deformity noted bilaterally Skin: no rashes Neuro: nonfocal  CBC:    BMET Recent Labs    12/11/17 0255 12/12/17 0850  NA 137 135  K 3.7 4.1  CL 107 102  CO2 23 22  GLUCOSE 121* 138*  BUN <5* 5*  CREATININE 1.00 1.03  CALCIUM 8.5* 8.6*     Liver Panel  Recent Labs    12/10/17 0618  PROT 5.6*  ALBUMIN 2.4*  AST 26  ALT 31  ALKPHOS 88  BILITOT 1.3*       Sedimentation Rate No results for input(s): ESRSEDRATE in the last 72 hours. C-Reactive Protein No results for input(s): CRP in the last 72 hours.  Micro Results: Recent Results (from the past 720 hour(s))  MRSA PCR Screening     Status: Abnormal   Collection Time: 11/15/17  8:04 AM  Result Value Ref Range Status   MRSA by PCR POSITIVE (A) NEGATIVE Final    Comment:        The GeneXpert MRSA Assay (FDA approved for NASAL specimens only), is one component of a comprehensive MRSA colonization surveillance program. It is not intended to diagnose MRSA infection nor to guide or monitor treatment for MRSA infections. RESULT CALLED TO, READ BACK BY AND VERIFIED  WITH: Paul Nipper RN AT 1238 11/15/17 BY D. VANHOOK   Culture, blood (routine x 2)     Status: None (Preliminary result)   Collection Time: 12/08/17 11:59 PM  Result Value Ref Range Status   Specimen Description BLOOD LEFT ANTECUBITAL  Final   Special Requests   Final    BOTTLES DRAWN AEROBIC AND ANAEROBIC Blood Culture adequate volume   Culture   Final    NO GROWTH 3 DAYS Performed at [facility], [address] [place], [place] [address]    Report Status PENDING  Incomplete  Culture, blood (routine x 2)     Status: None (Preliminary result)   Collection Time: 12/09/17 12:00 AM  Result Value Ref Range Status   Specimen Description BLOOD RIGHT ANTECUBITAL  Final   Special Requests   Final    BOTTLES DRAWN AEROBIC AND ANAEROBIC  Blood Culture results may not be optimal due to an inadequate volume of blood received in culture bottles   Culture   Final    NO GROWTH 3 DAYS Performed at [facility], [place] [address] [place], [place] [address]    Report Status PENDING  Incomplete  MRSA PCR Screening     Status: None   Collection Time: 12/09/17  5:45 AM  Result Value Ref Range Status   MRSA by PCR NEGATIVE NEGATIVE Final    Comment:        The GeneXpert MRSA Assay (FDA approved for NASAL specimens only), is one component of a comprehensive MRSA colonization surveillance program. It is not intended to diagnose MRSA infection nor to guide or monitor treatment for MRSA infections. Performed at [facility], [place] [address], [place], [place] [address]     Studies/Results: No results found.    Assessment/Plan:  INTERVAL HISTORY: pt to get IR attempt at drain  For diagnostic and therapeutic reasons   Principal Problem:   Sepsis ([place]) Active Problems:   Essential hypertension   Necrotizing pancreatitis   Hyperglycemia   Pancreatic abscess    Paul [name] is a 37 y.o. 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  In the interim I would like to narrow his antibiotics to IV Unasyn and if he does well on this we could potentially change him to oral Augmentin.  Hopefully the cultures himself will be useful.  I will follow along.   LOS: 4 days   Paul [name] 12/12/2017, 10:36 AM

## 2017-12-13 ENCOUNTER — Encounter (HOSPITAL_COMMUNITY): Payer: Self-pay | Admitting: Gastroenterology

## 2017-12-13 ENCOUNTER — Inpatient Hospital Stay (HOSPITAL_COMMUNITY): Payer: 59

## 2017-12-13 LAB — CBC
HCT: 33.3 % — ABNORMAL LOW (ref 39.0–52.0)
Hemoglobin: 10.8 g/dL — ABNORMAL LOW (ref 13.0–17.0)
MCH: 27.6 pg (ref 26.0–34.0)
MCHC: 32.4 g/dL (ref 30.0–36.0)
MCV: 85.2 fL (ref 78.0–100.0)
PLATELETS: 243 10*3/uL (ref 150–400)
RBC: 3.91 MIL/uL — ABNORMAL LOW (ref 4.22–5.81)
RDW: 13.2 % (ref 11.5–15.5)
WBC: 5.4 10*3/uL (ref 4.0–10.5)

## 2017-12-13 LAB — COMPREHENSIVE METABOLIC PANEL
ALT: 38 U/L (ref 0–44)
AST: 28 U/L (ref 15–41)
Albumin: 2.8 g/dL — ABNORMAL LOW (ref 3.5–5.0)
Alkaline Phosphatase: 99 U/L (ref 38–126)
Anion gap: 13 (ref 5–15)
BUN: <5 mg/dL — ABNORMAL LOW (ref 6–20)
CHLORIDE: 103 mmol/L (ref 98–111)
CO2: 23 mmol/L (ref 22–32)
CREATININE: 0.9 mg/dL (ref 0.61–1.24)
Calcium: 8.8 mg/dL — ABNORMAL LOW (ref 8.9–10.3)
Glucose, Bld: 146 mg/dL — ABNORMAL HIGH (ref 70–99)
Potassium: 3.5 mmol/L (ref 3.5–5.1)
Sodium: 139 mmol/L (ref 135–145)
TOTAL PROTEIN: 6.3 g/dL — AB (ref 6.5–8.1)
Total Bilirubin: 0.7 mg/dL (ref 0.3–1.2)

## 2017-12-13 LAB — GLUCOSE, CAPILLARY
GLUCOSE-CAPILLARY: 132 mg/dL — AB (ref 70–99)
Glucose-Capillary: 126 mg/dL — ABNORMAL HIGH (ref 70–99)
Glucose-Capillary: 134 mg/dL — ABNORMAL HIGH (ref 70–99)
Glucose-Capillary: 197 mg/dL — ABNORMAL HIGH (ref 70–99)

## 2017-12-13 MED ORDER — LIDOCAINE HCL 1 % IJ SOLN
INTRAMUSCULAR | Status: AC
  Filled 2017-12-13: qty 20

## 2017-12-13 MED ORDER — MIDAZOLAM HCL 2 MG/2ML IJ SOLN
INTRAMUSCULAR | Status: AC | PRN
  Administered 2017-12-13 (×4): 1 mg via INTRAVENOUS

## 2017-12-13 MED ORDER — FENTANYL CITRATE (PF) 100 MCG/2ML IJ SOLN
INTRAMUSCULAR | Status: AC | PRN
  Administered 2017-12-13 (×3): 50 ug via INTRAVENOUS

## 2017-12-13 MED ORDER — MIDAZOLAM HCL 2 MG/2ML IJ SOLN
INTRAMUSCULAR | Status: AC
  Filled 2017-12-13: qty 6

## 2017-12-13 MED ORDER — FENTANYL CITRATE (PF) 100 MCG/2ML IJ SOLN
INTRAMUSCULAR | Status: AC
  Filled 2017-12-13: qty 4

## 2017-12-13 MED ORDER — SODIUM CHLORIDE 0.9% FLUSH
5.0000 mL | Freq: Three times a day (TID) | INTRAVENOUS | Status: DC
  Administered 2017-12-13 – 2017-12-17 (×11): 5 mL

## 2017-12-13 NOTE — Progress Notes (Signed)
PROGRESS NOTE    Rollen Selders  DVV:[id number] DOB: Jan 23, 1981 DOA: 12/08/2017 PCP: Karleen Hampshire., MD    Brief Narrative:Paul [name] is a 37 y.o. male with history of renal cell carcinoma status post left-sided nephrectomy in 2012 who was admitted for idiopathic pancreatitis last month and had necrotizing pancreatitis eventually discharged home on IV antibiotics last dose was on December 02, 2017 last week presents to the ER with complaints of fever and chills for last 2 days.  Denies any nausea vomiting abdominal pain diarrhea able to eat well denies any chest pain productive cough headache visual symptoms.  Patient also was discharged on insulin for hyperglycemia which patient states has improved and has stopped using long-acting insulin but last 2 days his blood sugar started increasing again.  ED Course: In the ER patient had a fever of 102 F chest x-ray and urine were unremarkable.  CT of the abdomen shows features of necrotizing pancreatitis with no definite abscess.  Blood cultures are obtained started on fluid per sepsis protocol and admitted for possible developing sepsis.  Likely source could be intra-abdominal.  Assessment & Plan:   Principal Problem:   Sepsis ([place]) Active Problems:   Essential hypertension   Necrotizing pancreatitis   Hyperglycemia   Pancreatic abscess   Pancreatic necrosis  1-Sepsis; presents with fevers, leukocytosis. Source of infection probably necrotizing pancreatitis. Sepsis confirmed.  On meropenem-- ID  Recommend change antibiotics to Unasyn  UA negative Blood culture. No growth to date. Elevated lactic acid, pro-calcitonin level.  NSL.  Afebrile. Day 3 Unasyn.   2-Necrotizing pancreatitis;  CT shows inflammatory changes. GI consulted.  Will consult ID.  Continue with meropenem.  Mild elevation of bilirubin.  Bili trending down,  Unable to get adequate window for stent during EUS.  Plan for IR to place percutaneous drain. Dr Pascal Lux will discussed  case with Dr Barry Dienes, depending on conversation might proceed with drain placement.    3-HTN; hold Norvasc. Continue with atenolol. decrease dose.   4-Hyperglycemia; Continue with SSI. Might need long acting.   History of nephrectomy for left-sided renal cell carcinoma in 2012.  Hypokalemia; replaced.   Anemia; no melena. Iron deficiency.  Check occult blood. Negative  Need iron pills at discharge  Hb pending,.   Hypoxemia; transiently. NSL  Incentive spirometry   Bradycardia; reduce dose of atenolol.   DVT prophylaxis: SCD.  Code Status: full code.  Family Communication: care discussed with patient  Disposition Plan: remain inpatient for IV antibiotics.    Consultants:   GI   ID   Procedures:  \none  Antimicrobials: meropenem.   Subjective: Denies worsening abdominal pain.  Wants to eat,  Awaiting procedure.   Objective: Vitals:   12/12/17 0109 12/12/17 0806 12/12/17 1559 12/13/17 0308  BP: 131/79 133/88 115/72 137/83  Pulse: (!) 46 (!) 49 (!) 46 (!) 50  Resp: 20 18 18 18   Temp: 98.2 F (36.8 C) 98 F (36.7 C) 98.4 F (36.9 C) 98.3 F (36.8 C)  TempSrc: Oral Oral Oral Oral  SpO2: 97% 97% 96% 96%  Weight:      Height:        Intake/Output Summary (Last 24 hours) at 12/13/2017 0846 Last data filed at 12/12/2017 1529 Gross per 24 hour  Intake 340 ml  Output -  Net 340 ml   Filed Weights   12/09/17 0302  Weight: 77.1 kg    Examination:  General exam: NAD Respiratory system: CTA Cardiovascular system: S 1, S 2 RRR Gastrointestinal  system: BS present, soft, nt Central nervous system: Non focal.   Extremities:  Symmetric power.  Skin: no rashes.    Data Reviewed: I have personally reviewed following labs and imaging studies  CBC: Recent Labs  Lab 12/08/17 1659 12/09/17 0313 12/10/17 0618 12/11/17 0255 12/12/17 0850  WBC 13.5* 7.3 5.4 4.3 5.6  NEUTROABS  --  4.9  --   --   --   HGB 11.8* 10.6* 9.3* 9.6* 10.8*  HCT 36.6* 33.1*  29.0* 29.8* 33.1*  MCV 86.9 85.8 85.8 85.6 85.1  PLT 235 162 165 190 970   Basic Metabolic Panel: Recent Labs  Lab 12/08/17 1659 12/09/17 0313 12/10/17 0618 12/11/17 0255 12/12/17 0850  NA 133* 136 135 137 135  K 4.0 3.6 3.2* 3.7 4.1  CL 98 105 107 107 102  CO2 21* 20* 19* 23 22  GLUCOSE 220* 167* 133* 121* 138*  BUN 8 8 8  <5* 5*  CREATININE 1.17 1.17 1.10 1.00 1.03  CALCIUM 9.5 8.5* 7.9* 8.5* 8.6*   GFR: Estimated Creatinine Clearance: 102.4 mL/min (by C-G formula based on SCr of 1.03 mg/dL). Liver Function Tests: Recent Labs  Lab 12/08/17 1659 12/09/17 0313 12/10/17 0618  AST 42* 34 26  ALT 41 38 31  ALKPHOS 113 97 88  BILITOT 1.5* 2.0* 1.3*  PROT 7.2 6.1* 5.6*  ALBUMIN 3.4* 2.9* 2.4*   Recent Labs  Lab 12/08/17 1659  LIPASE 41   No results for input(s): AMMONIA in the last 168 hours. Coagulation Profile: Recent Labs  Lab 12/09/17 0313  INR 1.37   Cardiac Enzymes: No results for input(s): CKTOTAL, CKMB, CKMBINDEX, TROPONINI in the last 168 hours. BNP (last 3 results) No results for input(s): PROBNP in the last 8760 hours. HbA1C: No results for input(s): HGBA1C in the last 72 hours. CBG: Recent Labs  Lab 12/12/17 0803 12/12/17 1111 12/12/17 1557 12/13/17 0429 12/13/17 0753  GLUCAP 122* 120* 160* 134* 132*   Lipid Profile: No results for input(s): CHOL, HDL, LDLCALC, TRIG, CHOLHDL, LDLDIRECT in the last 72 hours. Thyroid Function Tests: No results for input(s): TSH, T4TOTAL, FREET4, T3FREE, THYROIDAB in the last 72 hours. Anemia Panel: Recent Labs    12/11/17 0255  VITAMINB12 1,337*  FOLATE 13.4  FERRITIN 862*  TIBC 178*  IRON 34*  RETICCTPCT 1.0   Sepsis Labs: Recent Labs  Lab 12/08/17 1719 12/08/17 2011 12/09/17 0313 12/09/17 0615  PROCALCITON  --   --  1.29  --   LATICACIDVEN 1.96* 2.56* 1.9 1.1    Recent Results (from the past 240 hour(s))  Culture, blood (routine x 2)     Status: None (Preliminary result)   Collection  Time: 12/08/17 11:59 PM  Result Value Ref Range Status   Specimen Description BLOOD LEFT ANTECUBITAL  Final   Special Requests   Final    BOTTLES DRAWN AEROBIC AND ANAEROBIC Blood Culture adequate volume   Culture   Final    NO GROWTH 3 DAYS Performed at [facility], [place] [address] [place], [place] [address]    Report Status PENDING  Incomplete  Culture, blood (routine x 2)     Status: None (Preliminary result)   Collection Time: 12/09/17 12:00 AM  Result Value Ref Range Status   Specimen Description BLOOD RIGHT ANTECUBITAL  Final   Special Requests   Final    BOTTLES DRAWN AEROBIC AND ANAEROBIC Blood Culture results may not be optimal due to an inadequate volume of blood received in culture bottles  Culture   Final    NO GROWTH 3 DAYS Performed at [facility], [place] [address], [place], [place] [address]    Report Status PENDING  Incomplete  MRSA PCR Screening     Status: None   Collection Time: 12/09/17  5:45 AM  Result Value Ref Range Status   MRSA by PCR NEGATIVE NEGATIVE Final    Comment:        The GeneXpert MRSA Assay (FDA approved for NASAL specimens only), is one component of a comprehensive MRSA colonization surveillance program. It is not intended to diagnose MRSA infection nor to guide or monitor treatment for MRSA infections. Performed at [facility], [place] [address] [place], [place] [address]          Radiology Studies: No results found.      Scheduled Meds: . allopurinol  100 mg Oral BID  . atenolol  25 mg Oral Daily  . insulin aspart  0-9 Units Subcutaneous TID WC  . pantoprazole  40 mg Oral Q0600  . vitamin B-12  1,000 mcg Oral Daily   Continuous Infusions: . ampicillin-sulbactam (UNASYN) IV 3 g (12/13/17 0311)     LOS: 5 days    Time spent: 35 minutes.     Elmarie Shiley, MD [facility] Pager [phone]  If 7PM-7AM, please contact night-coverage [email or web address] Password [facility] 12/13/2017, 8:46 AM

## 2017-12-13 NOTE — Consult Note (Signed)
Chief Complaint: Patient was seen in consultation today for pancreatic necrosis.  Referring Physician(s): Vena Rua  Supervising Physician: Sandi Mariscal  Patient Status: [facility] - In-pt  History of Present Illness: Paul [name] is a 37 y.o. male with a past medical history of hypertension, high cholesterol, migraines, GERD, pancreatitis, and renal cell carcinoma. He was admitted last month for idiopathic pancreatitis and nectrotizing pancreatitis. He was eventually discharged home on IV antibiotics. He represented to ED 12/08/2017 with complaints of fever and nausea. He underwent a CT abdomen/pelvis showing severe necrotizing pancreatitis and was admitted for management. He underwent an EUS with cyst gastrostomy 12/11/2017 with Dr. Rush Landmark. Unfortunately, he was unable to get an adequate window for stent during EUS.  CT abdomen/pelvis 12/08/2017: 1. Severe necrotizing pancreatitis. Persistent but slight decreased peripancreatic inflammation since CT 3 weeks ago. The previous lesser sac fluid collection is now contiguous with the necrotic pancreatic parenchyma and not separately distinguished. Slight increased fluid in the lesser sac but no new organizing fluid collection. 2. Decreasing fluid in the right pericolic gutter with residual soft tissue nodular densities, no internal air to suggest superimposed infection. 3. Persistent wall thickening of the ascending colon, likely reactive. 4. Resolved left pleural effusion.  IR requested by Azucena Freed, PA-C for possible image-guided percutaneous pancreatic cyst drain placement. Patient awake and alert laying in bed with no complaints at this time. Accompanied by wife at bedside. Denies fever, chills, chest pain, dyspnea, abdominal pain, dizziness, or headache.   Past Medical History:  Diagnosis Date  . Cancer of kidney [facility]) 2011   "left"  . Elevated blood uric acid level    "I take RX for it; Allopurinol" (11/04/2017)  . GERD  (gastroesophageal reflux disease)   . High cholesterol   . Hypertension   . Migraine    "only in my teens" (11/04/2017)  . Pancreatitis     Past Surgical History:  Procedure Laterality Date  . BIOPSY  12/11/2017   Procedure: BIOPSY;  Surgeon: Rush Landmark Telford Nab., MD;  Location: [place];  Service: Gastroenterology;;  . ESOPHAGOGASTRODUODENOSCOPY (EGD) WITH PROPOFOL N/A 12/11/2017   Procedure: ESOPHAGOGASTRODUODENOSCOPY (EGD) WITH PROPOFOL;  Surgeon: Irving Copas., MD;  Location: [place];  Service: Gastroenterology;  Laterality: N/A;  . NEPHRECTOMY Left 2011   "kidney cancer"  . UPPER ESOPHAGEAL ENDOSCOPIC ULTRASOUND (EUS) N/A 12/11/2017   Procedure: UPPER ESOPHAGEAL ENDOSCOPIC ULTRASOUND (EUS);  Surgeon: Irving Copas., MD;  Location: [place];  Service: Gastroenterology;  Laterality: N/A;  . UPPER GASTROINTESTINAL ENDOSCOPY  02/2017    Allergies: Honey  Medications: Prior to Admission medications   Medication Sig Start Date End Date Taking? Authorizing Provider  allopurinol (ZYLOPRIM) 100 MG tablet Take 100 mg by mouth 2 (two) times daily. 10/07/17  Yes [provider]  amLODipine (NORVASC) 2.5 MG tablet Take 2.5 mg by mouth at bedtime.  10/07/17  Yes [provider]  atenolol (TENORMIN) 50 MG tablet Take 50 mg by mouth daily. 10/27/17  Yes [provider]  ibuprofen (ADVIL,MOTRIN) 200 MG tablet Take 200 mg by mouth every 6 (six) hours as needed for fever.   Yes [provider]  insulin aspart (NOVOLOG) 100 UNIT/ML FlexPen Before each meal 3 times a day, 140-199 - 2 units, 200-250 - 4 units, 251-299 - 6 units,  300-349 - 8 units,  350 or above 10 units. Insulin PEN if approved, provide syringes and needles if needed. Patient taking differently: Inject 0-10 Units into the skin 3 (three) times daily  before meals. Per sliding scale: 140-199 - 2 units, 200-250 - 4 units, 251-299 - 6 units,  300-349 - 8 units,  350 or above  10 units. 11/18/17  Yes Florencia Reasons, MD  insulin glargine (LANTUS) 100 unit/mL SOPN Inject 0.15 mLs (15 Units total) into the skin at bedtime. Patient taking differently: Inject 15 Units into the skin at bedtime. Do not take if bs is 80 or lower 11/18/17  Yes Florencia Reasons, MD  vitamin B-12 (CYANOCOBALAMIN) 1000 MCG tablet Take 1,000 mcg by mouth daily.   Yes [provider]  Amino Acids-Protein Hydrolys (FEEDING SUPPLEMENT, PRO-STAT SUGAR FREE 64,) LIQD Take 30 mLs by mouth 3 (three) times daily between meals. Patient not taking: Reported on 12/08/2017 11/18/17   Florencia Reasons, MD  blood glucose meter kit and supplies Dispense based on patient and insurance preference. Use up to four times daily as directed. (FOR ICD-10 E10.9, E11.9). 11/18/17   Florencia Reasons, MD  Insulin Pen Needle 31G X 5 MM MISC For insulin injection, please provide a month supply. 11/18/17   Florencia Reasons, MD  LEVEMIR FLEXTOUCH 100 UNIT/ML Pen Inject 15 Units into the skin at bedtime. 11/27/17   [provider]     History reviewed. No pertinent family history.  Social History   Socioeconomic History  . Marital status: Married    Spouse name: Not on file  . Number of children: Not on file  . Years of education: Not on file  . Highest education level: Not on file  Occupational History  . Not on file  Social Needs  . Financial resource strain: Not on file  . Food insecurity:    Worry: Not on file    Inability: Not on file  . Transportation needs:    Medical: Not on file    Non-medical: Not on file  Tobacco Use  . Smoking status: Former Smoker    Packs/day: 0.50    Years: 11.00    Pack years: 5.50    Types: Cigarettes    Last attempt to quit: 2011    Years since quitting: 8.7  . Smokeless tobacco: Never Used  Substance and Sexual Activity  . Alcohol use: Yes    Alcohol/week: 4.0 standard drinks    Types: 4 Cans of beer per week  . Drug use: Never  . Sexual activity: Yes  Lifestyle  . Physical activity:    Days  per week: Not on file    Minutes per session: Not on file  . Stress: Not on file  Relationships  . Social connections:    Talks on phone: Not on file    Gets together: Not on file    Attends religious service: Not on file    Active member of club or organization: Not on file    Attends meetings of clubs or organizations: Not on file    Relationship status: Not on file  Other Topics Concern  . Not on file  Social History Narrative  . Not on file     Review of Systems: A 12 point ROS discussed and pertinent positives are indicated in the HPI above.  All other systems are negative.  Review of Systems  Constitutional: Negative for chills and fever.  Respiratory: Negative for shortness of breath and wheezing.   Cardiovascular: Negative for chest pain and palpitations.  Gastrointestinal: Negative for abdominal pain.  Neurological: Negative for dizziness and headaches.  Psychiatric/Behavioral: Negative for behavioral problems and confusion.    Vital Signs: BP  137/83 (BP Location: Left Arm)   Pulse (!) 50   Temp 98.3 F (36.8 C) (Oral)   Resp 18   Ht 5' 10" (1.778 m)   Wt 170 lb (77.1 kg)   SpO2 96%   BMI 24.39 kg/m   Physical Exam  Constitutional: He is oriented to person, place, and time. He appears well-developed and well-nourished. No distress.  Cardiovascular: Normal rate, regular rhythm and normal heart sounds.  No murmur heard. Pulmonary/Chest: Effort normal and breath sounds normal. No respiratory distress. He has no wheezes.  Abdominal: Soft. Bowel sounds are normal. There is no tenderness.  Neurological: He is alert and oriented to person, place, and time.  Skin: Skin is warm and dry.  Psychiatric: He has a normal mood and affect. His behavior is normal. Judgment and thought content normal.  Nursing note and vitals reviewed.    MD Evaluation Airway: WNL Heart: WNL Abdomen: WNL Abdomen comments: soft, protuberant, distended, minimal TTP upon deep  palpation Chest/ Lungs: WNL ASA  Classification: 2 Mallampati/Airway Score: One   Imaging: Dg Chest 2 View  Result Date: 12/08/2017 CLINICAL DATA:  Pancreatitis EXAM: CHEST - 2 VIEW COMPARISON:  11/12/2017, CT 11/16/2017 FINDINGS: Low lung volumes with atelectasis at the right base. No consolidation or effusion. Cardiomediastinal silhouette within normal limits. No pneumothorax. IMPRESSION: No active cardiopulmonary disease. Low lung volumes with atelectasis at the right base. Electronically Signed   By: Donavan Foil M.D.   On: 12/08/2017 21:04   Ct Abdomen Pelvis W Contrast  Result Date: 12/08/2017 CLINICAL DATA:  Sepsis and fever.  Recent pancreatitis. EXAM: CT ABDOMEN AND PELVIS WITH CONTRAST TECHNIQUE: Multidetector CT imaging of the abdomen and pelvis was performed using the standard protocol following bolus administration of intravenous contrast. CONTRAST:  166m ISOVUE-300 IOPAMIDOL (ISOVUE-300) INJECTION 61% COMPARISON:  Most recent CT 11/16/2017 FINDINGS: Lower chest: Resolved left pleural effusion with mild residual atelectasis. Minimal right lung base atelectasis. Small paraesophageal node, unchanged. Hepatobiliary: No focal liver abnormality is seen. No gallstones, gallbladder wall thickening, or biliary dilatation. Pancreas: Again seen sequela of necrotizing pancreatitis with near complete fluid replacement of pancreatic parenchyma. No internal air. Scattered irregular foci of soft tissue attenuation within the pancreas for example image 31 and 35 series 3 likely represent residual pancreatic parenchyma. Surrounding peripancreatic inflammatory stranding persists but is improved in the interim. The previous lesser sac fluid collection now appears contiguous with the necrotic pancreatic parenchyma, and is not separately distinguished. Small amount of fluid insinuates superiorly in the lesser sac image 28 series 3 with slight increase from prior exam. Fluid in the right pericolic gutter has  diminished but appears organizing and is lobular in appearance. No discrete extra pancreatic fluid collection. Spleen: Normal in size. Fluid or pace pancreatic tail abuts the splenic hilum. The splenic vein slightly narrowed but remains patent. Adrenals/Urinary Tract: Post left nephrectomy. Compensatory hypertrophy of the right kidney. No right hydronephrosis. Urinary bladder is physiologically distended without wall thickening. Stomach/Bowel: Bowel evaluation is limited in the absence of enteric contrast. Mild gastric wall enhancement and wall thickening likely reactive due to adjacent pancreatic inflammation. No evidence of bowel obstruction. Borderline wall thickening of the ascending colon is likely reactive. Air-filled appendix is prominent diameter proximally but without periappendiceal inflammation. Vascular/Lymphatic: Splenic vein, portal vein, and mesenteric vessels are patent. Normal caliber abdominal aorta. Multiple small retroperitoneal nodes are likely reactive and not enlarged by size criteria. Reproductive: Prostate is unremarkable. Other: No free air. Persistent soft tissue density in the right  pericolic gutter with decreased fluid but increasing lobular soft tissue components. Musculoskeletal: Bone islands in the pelvis and proximal femora. There are no acute or suspicious osseous abnormalities. IMPRESSION: 1. Severe necrotizing pancreatitis. Persistent but slight decreased peripancreatic inflammation since CT 3 weeks ago. The previous lesser sac fluid collection is now contiguous with the necrotic pancreatic parenchyma and not separately distinguished. Slight increased fluid in the lesser sac but no new organizing fluid collection. 2. Decreasing fluid in the right pericolic gutter with residual soft tissue nodular densities, no internal air to suggest superimposed infection. 3. Persistent wall thickening of the ascending colon, likely reactive. 4. Resolved left pleural effusion. Electronically  Signed   By: Keith Rake M.D.   On: 12/08/2017 23:11   Ct Abdomen Pelvis W Contrast  Result Date: 11/16/2017 CLINICAL DATA:  Inpatient. Severe necrotizing pancreatitis. Fever. SIRS. Leukocytosis. EXAM: CT ABDOMEN AND PELVIS WITH CONTRAST TECHNIQUE: Multidetector CT imaging of the abdomen and pelvis was performed using the standard protocol following bolus administration of intravenous contrast. CONTRAST:  126m ISOVUE-300 IOPAMIDOL (ISOVUE-300) INJECTION 61% COMPARISON:  11/11/2017 CT abdomen/pelvis. FINDINGS: Lower chest: Small dependent left pleural effusion, stable. Mild-to-moderate left and mild right lung base atelectasis. Hepatobiliary: Normal liver size. No liver mass. Normal gallbladder with no radiopaque cholelithiasis. No biliary ductal dilatation. Pancreas: There is diffuse thickening of the pancreas. There is non enhancement of most of the pancreatic parenchyma, compatible with severe necrotizing pancreatitis, not appreciably changed. A few scattered small irregular foci of soft tissue attenuation within the pancreas (series 3/image 45 in the pancreatic head, image 33 in the pancreatic body and image 38 in the pancreatic tail) are unchanged and probably represent preserved regions of parenchymal enhancement. No pancreatic duct dilation. Lesser sac 9.0 x 4.7 cm fluid collection with increased wall thickening and enhancement (series 3/image 28) is more discretely defined and slightly decreased in size from 10.1 x 5.5 cm. Spleen: Normal size. No mass. Adrenals/Urinary Tract: No discrete adrenal nodules. Status post left nephrectomy. No mass or fluid collection in the left nephrectomy bed. Compensatory hypertrophy of the right kidney. No right hydronephrosis. No right renal mass. Normal bladder. Stomach/Bowel: Normal non-distended stomach. Normal caliber small bowel with no small bowel wall thickening. Normal appendix. Mild wall thickening in the right colon and splenic flexure of the colon,  unchanged, probably reactive. Vascular/Lymphatic: Normal caliber abdominal aorta. Patent hepatic, portal, splenic and right renal veins. No pathologically enlarged lymph nodes in the abdomen or pelvis. Reproductive: Normal size prostate. Other: No pneumoperitoneum. Ill-defined fat stranding and fluid throughout the bilateral anterior paranephric retroperitoneal spaces, right greater than left, slightly decreased. Stable tiny fat containing periumbilical hernia. Musculoskeletal: No aggressive appearing focal osseous lesions. IMPRESSION: 1. Severe diffuse necrotizing pancreatitis without significant interval change. Lesser sac peripancreatic fluid collection is slightly decreased in size but with increased wall thickening. Right greater than left retroperitoneal inflammatory changes are slightly decreased. 2. Small dependent left pleural effusion, stable. 3. Mild wall thickening in the right colon and splenic flexure of the colon, stable, probably reactive. Electronically Signed   By: JIlona SorrelM.D.   On: 11/16/2017 17:18   UK[place]kg Site Rite  Result Date: 11/17/2017 If Site Rite image not attached, placement could not be confirmed due to current cardiac rhythm.   Labs:  CBC: Recent Labs    12/10/17 0618 12/11/17 0255 12/12/17 0850 12/13/17 0833  WBC 5.4 4.3 5.6 5.4  HGB 9.3* 9.6* 10.8* 10.8*  HCT 29.0* 29.8* 33.1* 33.3*  PLT 165 190 256  243    COAGS: Recent Labs    11/13/17 0414 11/17/17 0803 11/24/17 1253 12/09/17 0313  INR 1.07 1.07 1.2* 1.37  APTT  --   --   --  35    BMP: Recent Labs    12/10/17 0618 12/11/17 0255 12/12/17 0850 12/13/17 0833  NA 135 137 135 139  K 3.2* 3.7 4.1 3.5  CL 107 107 102 103  CO2 19* _0 GLUCOSE 133* 121* 138* 146*  BUN 8 <5* 5* <5*  CALCIUM 7.9* 8.5* 8.6* 8.8*  CREATININE 1.10 1.00 1.03 0.90  GFRNONAA >60 >60 >60 >60  GFRAA >60 >60 >60 >60    LIVER FUNCTION TESTS: Recent Labs    12/08/17 1659 12/09/17 0313 12/10/17 0618  12/13/17 0833  BILITOT 1.5* 2.0* 1.3* 0.7  AST 42* 34 26 28  ALT 41 38 31 38  ALKPHOS 113 97 88 99  PROT 7.2 6.1* 5.6* 6.3*  ALBUMIN 3.4* 2.9* 2.4* 2.8*    TUMOR MARKERS: No results for input(s): AFPTM, CEA, CA199, CHROMGRNA in the last 8760 hours.  Assessment and Plan:  Pancreatic necrosis. Plan for image-guided percutnaeous pancreatic cyst drain placement today with Dr. Pascal Lux. Patient is NPO. Denies fever and WBCs WNL. He does not take blood thinners. INR 1.37 seconds 12/09/2017.  Risks and benefits discussed with the patient including bleeding, infection, damage to adjacent structures, bowel perforation/fistula connection, and sepsis. All of the patient's questions were answered, patient is agreeable to proceed. Consent signed and in chart.   Thank you for this interesting consult.  I greatly enjoyed meeting Paul [name] and look forward to participating in their care.  A copy of this report was sent to the requesting provider on this date.  Electronically Signed: Earley Abide, PA-C 12/13/2017, 10:51 AM   I spent a total of 20 Minutes in face to face in clinical consultation, greater than 50% of which was counseling/coordinating care for pancreatic necrosis.

## 2017-12-13 NOTE — Procedures (Signed)
Pre procedural Dx: Pancreatic Pseudocyst Post procedural Dx: Same  Technically successful CT guided placed of a 10 Fr multi-side hole drainage catheter placement into the pancreatic pseudocyst yielding 15 cc of brown purulent appearing fluid.    All aspirated samples sent to the laboratory for analysis.    EBL: None  Complications: None immediate  Ronny Bacon, MD Pager #: [phone]

## 2017-12-13 NOTE — Progress Notes (Signed)
Subjective: No new complaints   Antibiotics:  Anti-infectives (From admission, onward)   Start     Dose/Rate Route Frequency Ordered Stop   12/12/17 1500  Ampicillin-Sulbactam (UNASYN) 3 g in sodium chloride 0.9 % 100 mL IVPB     3 g 200 mL/hr over 30 Minutes Intravenous Every 6 hours 12/12/17 1135     12/09/17 1200  vancomycin (VANCOCIN) IVPB 750 mg/150 ml premix  Status:  Discontinued     750 mg 150 mL/hr over 60 Minutes Intravenous Every 8 hours 12/09/17 0401 12/09/17 1142   12/09/17 0600  meropenem (MERREM) 1 g in sodium chloride 0.9 % 100 mL IVPB  Status:  Discontinued     1 g 200 mL/hr over 30 Minutes Intravenous Every 8 hours 12/09/17 0254 12/12/17 1020   12/09/17 0415  vancomycin (VANCOCIN) 1,500 mg in sodium chloride 0.9 % 500 mL IVPB     1,500 mg 250 mL/hr over 120 Minutes Intravenous  Once 12/09/17 0401 12/09/17 1905   12/08/17 2330  piperacillin-tazobactam (ZOSYN) IVPB 3.375 g     3.375 g 100 mL/hr over 30 Minutes Intravenous  Once 12/08/17 2323 12/09/17 0038      Medications: Scheduled Meds: . allopurinol  100 mg Oral BID  . atenolol  25 mg Oral Daily  . insulin aspart  0-9 Units Subcutaneous TID WC  . lidocaine      . pantoprazole  40 mg Oral Q0600  . vitamin B-12  1,000 mcg Oral Daily   Continuous Infusions: . ampicillin-sulbactam (UNASYN) IV 3 g (12/13/17 0850)   PRN Meds:.acetaminophen **OR** acetaminophen, ibuprofen, ondansetron **OR** ondansetron (ZOFRAN) IV    Objective: Weight change:   Intake/Output Summary (Last 24 hours) at 12/13/2017 1138 Last data filed at 12/13/2017 0850 Gross per 24 hour  Intake 640 ml  Output -  Net 640 ml   Blood pressure 137/83, pulse (!) 50, temperature 98.3 F (36.8 C), temperature source Oral, resp. rate 18, height 5\' 10"  (1.778 m), weight 77.1 kg, SpO2 96 %. Temp:  [98.3 F (36.8 C)-98.4 F (36.9 C)] 98.3 F (36.8 C) (09/15 0308) Pulse Rate:  [46-50] 50 (09/15 0308) Resp:  [18] 18 (09/15  0308) BP: (115-137)/(72-83) 137/83 (09/15 0308) SpO2:  [96 %] 96 % (09/15 0308)  Physical Exam: General: Alert and awake, oriented x3, not in any acute distress. HEENT: anicteric sclera, EOMI CVS regular rate, normal  Chest: , no wheezing, no respiratory distress Abdomen: soft non-distended,  Extremities: no edema or deformity noted bilaterally Skin: no rashes Neuro: nonfocal  CBC:    BMET Recent Labs    12/12/17 0850 12/13/17 0833  NA 135 139  K 4.1 3.5  CL 102 103  CO2 22 23  GLUCOSE 138* 146*  BUN 5* <5*  CREATININE 1.03 0.90  CALCIUM 8.6* 8.8*     Liver Panel  Recent Labs    12/13/17 0833  PROT 6.3*  ALBUMIN 2.8*  AST 28  ALT 38  ALKPHOS 99  BILITOT 0.7       Sedimentation Rate No results for input(s): ESRSEDRATE in the last 72 hours. C-Reactive Protein No results for input(s): CRP in the last 72 hours.  Micro Results: Recent Results (from the past 720 hour(s))  MRSA PCR Screening     Status: Abnormal   Collection Time: 11/15/17  8:04 AM  Result Value Ref Range Status   MRSA by PCR POSITIVE (A) NEGATIVE Final    Comment:  The GeneXpert MRSA Assay (FDA approved for NASAL specimens only), is one component of a comprehensive MRSA colonization surveillance program. It is not intended to diagnose MRSA infection nor to guide or monitor treatment for MRSA infections. RESULT CALLED TO, READ BACK BY AND VERIFIED WITH: T. BOYKINS RN AT 1238 11/15/17 BY D. VANHOOK   Culture, blood (routine x 2)     Status: None (Preliminary result)   Collection Time: 12/08/17 11:59 PM  Result Value Ref Range Status   Specimen Description BLOOD LEFT ANTECUBITAL  Final   Special Requests   Final    BOTTLES DRAWN AEROBIC AND ANAEROBIC Blood Culture adequate volume   Culture   Final    NO GROWTH 3 DAYS Performed at [facility], [address], [place], [place] [address]    Report Status PENDING  Incomplete  Culture, blood (routine x 2)     Status:  None (Preliminary result)   Collection Time: 12/09/17 12:00 AM  Result Value Ref Range Status   Specimen Description BLOOD RIGHT ANTECUBITAL  Final   Special Requests   Final    BOTTLES DRAWN AEROBIC AND ANAEROBIC Blood Culture results may not be optimal due to an inadequate volume of blood received in culture bottles   Culture   Final    NO GROWTH 3 DAYS Performed at [facility], [place] [address], [place], [place] [address]    Report Status PENDING  Incomplete  MRSA PCR Screening     Status: None   Collection Time: 12/09/17  5:45 AM  Result Value Ref Range Status   MRSA by PCR NEGATIVE NEGATIVE Final    Comment:        The GeneXpert MRSA Assay (FDA approved for NASAL specimens only), is one component of a comprehensive MRSA colonization surveillance program. It is not intended to diagnose MRSA infection nor to guide or monitor treatment for MRSA infections. Performed at [facility], [place] [address], [place], [place] [address]     Studies/Results: No results found.    Assessment/Plan:  INTERVAL HISTORY: pt to get IR attempt at drain today  Principal Problem:   Sepsis ([place]) Active Problems:   Essential hypertension   Necrotizing pancreatitis   Hyperglycemia   Pancreatic abscess   Pancreatic necrosis    Paul [name] is a 37 y.o. 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  In the interim I  Narrowed his antibiotics to IV Unasyn a  Hopefully the cultures today will be useful  I would like to treat him an oral regimen such as augmentin (if organisms S to it or no growth) until this fact that cyst has resolved clinically and radiographically.  Dr. Johnnye Sima take over the service tomorrow.   LOS: 5 days    Alcide Evener 12/13/2017, 11:38 AM

## 2017-12-13 NOTE — Progress Notes (Addendum)
     [facility] Progress Note  CC:  Necrotizing pancreatitis and pseudocyst  Subjective:  Feels ok.  No abdominal pain.  No fever.  Objective:  Vital signs in last 24 hours: Temp:  [98.3 F (36.8 C)-98.4 F (36.9 C)] 98.3 F (36.8 C) (09/15 0308) Pulse Rate:  [46-50] 50 (09/15 0308) Resp:  [18] 18 (09/15 0308) BP: (115-137)/(72-83) 137/83 (09/15 0308) SpO2:  [96 %] 96 % (09/15 0308) Last BM Date: 12/12/17 General:  Alert, Well-developed, in NAD Heart:  Bardycardic; no murmurs Pulm:  CTAB.  No increased WOB. Abdomen:  Soft, non-distended.  BS present.  Non-tender.  Extremities:  Without edema. Neurologic:  Alert and oriented x 4;  grossly normal neurologically. Psych:  Alert and cooperative. Normal mood and affect.  Intake/Output from previous day: 09/14 0701 - 09/15 0700 In: 340 [P.O.:240; IV Piggyback:100] Out: -   Lab Results: Recent Labs    12/11/17 0255 12/12/17 0850 12/13/17 0833  WBC 4.3 5.6 5.4  HGB 9.6* 10.8* 10.8*  HCT 29.8* 33.1* 33.3*  PLT 190 256 243   BMET Recent Labs    12/11/17 0255 12/12/17 0850 12/13/17 0833  NA 137 135 139  K 3.7 4.1 3.5  CL 107 102 103  CO2 23 22 23   GLUCOSE 121* 138* 146*  BUN <5* 5* <5*  CREATININE 1.00 1.03 0.90  CALCIUM 8.5* 8.6* 8.8*   LFT Recent Labs    12/13/17 0833  PROT 6.3*  ALBUMIN 2.8*  AST 28  ALT 38  ALKPHOS 99  BILITOT 0.7   Assessment / Plan: 1) Pancreatic Pseudocyst/Necrotizing pancreatitis: - Continue Abx per ID - IR guided perc drain with fluid sampling for culture and sensitivities, which they are going to try to do today. - General Surgery service aware and have seen patient.  I spoke with them and let them know IR's plan for per drain today.   LOS: 5 days   Laban Emperor. Zehr  12/13/2017, 10:12 AM   Attending physician's note   I have taken an interval history, reviewed the chart and examined the patient. I agree with the Advanced Practitioner's note, impression, and  recommendations as outlined.  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  Attempted EUS with cyst gastrostomy on 9/13, but unfortunately unable to gain adequate windows for [facility] stent placement.  He was continued on IV antibiotics with a plan for IR guided percutaneous drain placement.  He is otherwise remained clinically stable and afebrile for the last 48 hours.  General surgery service consulted earlier in the admission and aware of plan for IR today.  - Changed to Unasyn yesterday by ID with plan to tailor Abx per culture results and sensitivities - To IR today for sampling and percutaneous drain placement - Blood cultures with no growth to date   -Sincerely appreciate coordinated care by multiple consulting services and primary hospitalist service -Will continue to follow  Gerrit Heck, [place], [place] [phone] office

## 2017-12-14 ENCOUNTER — Inpatient Hospital Stay: Admission: RE | Admit: 2017-12-14 | Payer: 59 | Source: Ambulatory Visit

## 2017-12-14 ENCOUNTER — Encounter: Payer: Self-pay | Admitting: Gastroenterology

## 2017-12-14 ENCOUNTER — Telehealth: Payer: Self-pay | Admitting: Gastroenterology

## 2017-12-14 ENCOUNTER — Encounter (HOSPITAL_COMMUNITY): Payer: Self-pay | Admitting: Internal Medicine

## 2017-12-14 DIAGNOSIS — D649 Anemia, unspecified: Secondary | ICD-10-CM

## 2017-12-14 DIAGNOSIS — E46 Unspecified protein-calorie malnutrition: Secondary | ICD-10-CM

## 2017-12-14 DIAGNOSIS — Z978 Presence of other specified devices: Secondary | ICD-10-CM

## 2017-12-14 LAB — CBC
HCT: 34.9 % — ABNORMAL LOW (ref 39.0–52.0)
HEMOGLOBIN: 11.4 g/dL — AB (ref 13.0–17.0)
MCH: 27.6 pg (ref 26.0–34.0)
MCHC: 32.7 g/dL (ref 30.0–36.0)
MCV: 84.5 fL (ref 78.0–100.0)
Platelets: 303 10*3/uL (ref 150–400)
RBC: 4.13 MIL/uL — ABNORMAL LOW (ref 4.22–5.81)
RDW: 13.2 % (ref 11.5–15.5)
WBC: 6.7 10*3/uL (ref 4.0–10.5)

## 2017-12-14 LAB — CULTURE, BLOOD (ROUTINE X 2)
CULTURE: NO GROWTH
CULTURE: NO GROWTH
Special Requests: ADEQUATE

## 2017-12-14 LAB — GLUCOSE, CAPILLARY
GLUCOSE-CAPILLARY: 144 mg/dL — AB (ref 70–99)
GLUCOSE-CAPILLARY: 154 mg/dL — AB (ref 70–99)
Glucose-Capillary: 157 mg/dL — ABNORMAL HIGH (ref 70–99)
Glucose-Capillary: 146 mg/dL — ABNORMAL HIGH (ref 70–99)
Glucose-Capillary: 190 mg/dL — ABNORMAL HIGH (ref 70–99)

## 2017-12-14 LAB — COMPREHENSIVE METABOLIC PANEL
ALT: 37 U/L (ref 0–44)
ANION GAP: 9 (ref 5–15)
AST: 27 U/L (ref 15–41)
Albumin: 3 g/dL — ABNORMAL LOW (ref 3.5–5.0)
Alkaline Phosphatase: 103 U/L (ref 38–126)
BUN: 5 mg/dL — ABNORMAL LOW (ref 6–20)
CALCIUM: 9 mg/dL (ref 8.9–10.3)
CO2: 25 mmol/L (ref 22–32)
Chloride: 103 mmol/L (ref 98–111)
Creatinine, Ser: 0.97 mg/dL (ref 0.61–1.24)
Glucose, Bld: 150 mg/dL — ABNORMAL HIGH (ref 70–99)
POTASSIUM: 3.9 mmol/L (ref 3.5–5.1)
Sodium: 137 mmol/L (ref 135–145)
TOTAL PROTEIN: 6.9 g/dL (ref 6.5–8.1)
Total Bilirubin: 0.7 mg/dL (ref 0.3–1.2)

## 2017-12-14 MED ORDER — ENSURE ENLIVE PO LIQD
237.0000 mL | Freq: Two times a day (BID) | ORAL | Status: DC
  Administered 2017-12-16: 237 mL via ORAL

## 2017-12-14 NOTE — Progress Notes (Signed)
PROGRESS NOTE    [name]  QQV:[id number] DOB: 1980-10-31 DOA: 12/08/2017 PCP: Karleen Hampshire., MD    Brief Narrative:Paul [name] is a 37 y.o. male with history of renal cell carcinoma status post left-sided nephrectomy in 2012 who was admitted for idiopathic pancreatitis last month and had necrotizing pancreatitis eventually discharged home on IV antibiotics last dose was on December 02, 2017 last week presents to the ER with complaints of fever and chills for last 2 days.  Denies any nausea vomiting abdominal pain diarrhea able to eat well denies any chest pain productive cough headache visual symptoms.  Patient also was discharged on insulin for hyperglycemia which patient states has improved and has stopped using long-acting insulin but last 2 days his blood sugar started increasing again.  ED Course: In the ER patient had a fever of 102 F chest x-ray and urine were unremarkable.  CT of the abdomen shows features of necrotizing pancreatitis with no definite abscess.  Blood cultures are obtained started on fluid per sepsis protocol and admitted for possible developing sepsis.  Likely source could be intra-abdominal.  Assessment & Plan:   Principal Problem:   Sepsis ([place]) Active Problems:   Essential hypertension   Necrotizing pancreatitis   Hyperglycemia   Pancreatic abscess   Pancreatic necrosis  1-Sepsis; Presents with fevers, leukocytosis. Source of infection probably necrotizing pancreatitis. Sepsis confirmed.  Now Unasyn , tolerating so far.  UA negative Blood culture. No growth to date. Elevated lactic acid, pro-calcitonin level.  NSL.  Afebrile. Day 4 Unasyn.   2-Necrotizing pancreatitis;  CT shows inflammatory changes. GI consulted.  Will consult ID.  Continue with meropenem.  Mild elevation of bilirubin.  Bili trending down,  Unable to get adequate window for stent during EUS.  Underwent percutaneous drain placement into pancreatic pseudocyst by IR  Follow culture;  gram positive cocci cluster.   3-HTN; hold Norvasc. Continue with atenolol. decrease dose.   4-Hyperglycemia; Continue with SSI. Might need long acting.   History of nephrectomy for left-sided renal cell carcinoma in 2012.  Hypokalemia; replaced.   Anemia; no melena. Iron deficiency.  Check occult blood. Negative  Need iron pills at discharge  Hb increasing   Hypoxemia; transiently. NSL  Incentive spirometry   Bradycardia; reduce dose of atenolol.   DVT prophylaxis: SCD.  Code Status: full code.  Family Communication: care discussed with patient  Disposition Plan: remain inpatient for IV antibiotics.    Consultants:   GI   ID   Procedures:  \none  Antimicrobials: meropenem.   Subjective: He report pain at site of catheter when he takes deep breath.  He denies abdominal pain, chest pain or dyspnea.     Objective: Vitals:   12/13/17 1257 12/13/17 1714 12/13/17 2346 12/14/17 0756  BP: (!) 137/97 (!) 138/95 (!) 149/96 (!) 133/95  Pulse: 76 (!) 58 73 62  Resp: 15 16 10    Temp:  98.4 F (36.9 C) 98.8 F (37.1 C) 98.1 F (36.7 C)  TempSrc:  Oral Oral Oral  SpO2: 100% 97% 96% 95%  Weight:      Height:        Intake/Output Summary (Last 24 hours) at 12/14/2017 1236 Last data filed at 12/14/2017 1000 Gross per 24 hour  Intake 751.11 ml  Output -  Net 751.11 ml   Filed Weights   12/09/17 0302  Weight: 77.1 kg    Examination:  General exam: NAD Respiratory system: CTA Cardiovascular system; S 1, S 2 RRR Gastrointestinal system:  BS present, soft, nt catheter place left side, clean dressing  Central nervous system: Non focal.  Extremities:  Symmetric power.  Skin: No rashes.    Data Reviewed: I have personally reviewed following labs and imaging studies  CBC: Recent Labs  Lab 12/09/17 0313 12/10/17 0618 12/11/17 0255 12/12/17 0850 12/13/17 0833 12/14/17 0832  WBC 7.3 5.4 4.3 5.6 5.4 6.7  NEUTROABS 4.9  --   --   --   --   --   HGB 10.6*  9.3* 9.6* 10.8* 10.8* 11.4*  HCT 33.1* 29.0* 29.8* 33.1* 33.3* 34.9*  MCV 85.8 85.8 85.6 85.1 85.2 84.5  PLT 162 165 190 256 243 559   Basic Metabolic Panel: Recent Labs  Lab 12/10/17 0618 12/11/17 0255 12/12/17 0850 12/13/17 0833 12/14/17 0832  NA 135 137 135 139 137  K 3.2* 3.7 4.1 3.5 3.9  CL 107 107 102 103 103  CO2 19* 23 22 23 25   GLUCOSE 133* 121* 138* 146* 150*  BUN 8 <5* 5* <5* 5*  CREATININE 1.10 1.00 1.03 0.90 0.97  CALCIUM 7.9* 8.5* 8.6* 8.8* 9.0   GFR: Estimated Creatinine Clearance: 108.7 mL/min (by C-G formula based on SCr of 0.97 mg/dL). Liver Function Tests: Recent Labs  Lab 12/08/17 1659 12/09/17 0313 12/10/17 0618 12/13/17 0833 12/14/17 0832  AST 42* 34 26 28 27   ALT 41 38 31 38 37  ALKPHOS 113 97 88 99 103  BILITOT 1.5* 2.0* 1.3* 0.7 0.7  PROT 7.2 6.1* 5.6* 6.3* 6.9  ALBUMIN 3.4* 2.9* 2.4* 2.8* 3.0*   Recent Labs  Lab 12/08/17 1659  LIPASE 41   No results for input(s): AMMONIA in the last 168 hours. Coagulation Profile: Recent Labs  Lab 12/09/17 0313  INR 1.37   Cardiac Enzymes: No results for input(s): CKTOTAL, CKMB, CKMBINDEX, TROPONINI in the last 168 hours. BNP (last 3 results) No results for input(s): PROBNP in the last 8760 hours. HbA1C: No results for input(s): HGBA1C in the last 72 hours. CBG: Recent Labs  Lab 12/13/17 1329 12/13/17 1616 12/13/17 2342 12/14/17 0754 12/14/17 1210  GLUCAP 126* 157* 197* 144* 146*   Lipid Profile: No results for input(s): CHOL, HDL, LDLCALC, TRIG, CHOLHDL, LDLDIRECT in the last 72 hours. Thyroid Function Tests: No results for input(s): TSH, T4TOTAL, FREET4, T3FREE, THYROIDAB in the last 72 hours. Anemia Panel: No results for input(s): VITAMINB12, FOLATE, FERRITIN, TIBC, IRON, RETICCTPCT in the last 72 hours. Sepsis Labs: Recent Labs  Lab 12/08/17 1719 12/08/17 2011 12/09/17 0313 12/09/17 0615  PROCALCITON  --   --  1.29  --   LATICACIDVEN 1.96* 2.56* 1.9 1.1    Recent Results  (from the past 240 hour(s))  Culture, blood (routine x 2)     Status: None   Collection Time: 12/08/17 11:59 PM  Result Value Ref Range Status   Specimen Description BLOOD LEFT ANTECUBITAL  Final   Special Requests   Final    BOTTLES DRAWN AEROBIC AND ANAEROBIC Blood Culture adequate volume   Culture   Final    NO GROWTH 5 DAYS Performed at [facility], [place] [address] [place], [place] [address]    Report Status 12/14/2017 FINAL  Final  Culture, blood (routine x 2)     Status: None   Collection Time: 12/09/17 12:00 AM  Result Value Ref Range Status   Specimen Description BLOOD RIGHT ANTECUBITAL  Final   Special Requests   Final    BOTTLES DRAWN AEROBIC AND ANAEROBIC Blood Culture  results may not be optimal due to an inadequate volume of blood received in culture bottles   Culture   Final    NO GROWTH 5 DAYS Performed at [facility], [place] [address], [place], [place] [address]    Report Status 12/14/2017 FINAL  Final  MRSA PCR Screening     Status: None   Collection Time: 12/09/17  5:45 AM  Result Value Ref Range Status   MRSA by PCR NEGATIVE NEGATIVE Final    Comment:        The GeneXpert MRSA Assay (FDA approved for NASAL specimens only), is one component of a comprehensive MRSA colonization surveillance program. It is not intended to diagnose MRSA infection nor to guide or monitor treatment for MRSA infections. Performed at [facility], [place] [address] [place], [place] [address]   Aerobic/Anaerobic Culture (surgical/deep wound)     Status: None (Preliminary result)   Collection Time: 12/13/17  1:13 PM  Result Value Ref Range Status   Specimen Description WOUND  Final   Special Requests Normal  Final   Gram Stain   Final    ABUNDANT WBC PRESENT, PREDOMINANTLY PMN MODERATE GRAM POSITIVE COCCI    Culture   Final    CULTURE REINCUBATED FOR BETTER GROWTH Performed at [facility], [place] [address], [place], [place] [address]    Report Status  PENDING  Incomplete         Radiology Studies: Ct Image Guided Drainage By Percutaneous Catheter  Result Date: 12/13/2017 INDICATION: History of pancreatic pseudocyst post failed attempted cyst-gastrostomy tube placement. Patient has been evaluated by the surgical service and request has been made for placement of a percutaneous drainage catheter for infection source control purposes. EXAM: CT IMAGE GUIDED DRAINAGE BY PERCUTANEOUS CATHETER COMPARISON:  CT abdomen and pelvis - 12/08/2017; 11/16/2017; 11/04/2017 MEDICATIONS: The patient is currently admitted to the hospital and receiving intravenous antibiotics. The antibiotics were administered within an appropriate time frame prior to the initiation of the procedure. ANESTHESIA/SEDATION: Moderate (conscious) sedation was employed during this procedure. A total of Versed 4 mg and Fentanyl 150 mcg was administered intravenously. Moderate Sedation Time: 22 minutes. The patient's level of consciousness and vital signs were monitored continuously by radiology nursing throughout the procedure under my direct supervision. CONTRAST:  None COMPLICATIONS: None immediate. PROCEDURE: Informed written consent was obtained from the patient after a discussion of the risks, benefits and alternatives to treatment. The patient was placed supine, slightly RPO on the CT gantry and a pre procedural CT was performed re-demonstrating the known abscess/fluid collection within the pancreatic bed with dominant air and fluid containing pancreatic pseudocyst appearing slightly smaller since undergoing attempted cyst gastrostomy placement but with residual component measuring at least 10.9 x 4.0 cm (26, series 3). The procedure was planned. A timeout was performed prior to the initiation of the procedure. The skin overlying the left lateral abdomen was prepped and draped in the usual sterile fashion. The overlying soft tissues were anesthetized with 1% lidocaine with epinephrine.  Appropriate trajectory was planned with the use of a 22 gauge spinal needle. An 18 gauge trocar needle was advanced into the abscess/fluid collection however the wire was noted to be coiled within the likely loculated lateral component of the collection. As such, the trocar needle was exchanged for a 15 cm Yueh sheath catheter which was utilized to manipulate the Amplatz wire to the medial aspect of the pancreatic pseudocyst. Appropriate position was confirmed with CT imaging. The  tract was serially dilated allowing placement of a 10 [place] all-purpose multi side-hole biliary drainage catheter. Appropriate positioning was confirmed with a limited postprocedural CT scan. 25 ml of brown-colored purulent appearing fluid was aspirated. The tube was connected to a JP bulb and sutured in place. A dressing was placed. The patient tolerated the procedure well without immediate post procedural complication. IMPRESSION: Successful CT guided placement of a 10 French all purpose multi side-hole biliary drain catheter into the pancreatic pseudocyst with aspiration of 25 cc of brown colored, purulent appearing fluid. Samples were sent to the laboratory as requested by the ordering clinical team. Electronically Signed   By: Sandi Mariscal M.D.   On: 12/13/2017 16:18        Scheduled Meds: . allopurinol  100 mg Oral BID  . atenolol  25 mg Oral Daily  . feeding supplement (ENSURE ENLIVE)  237 mL Oral BID BM  . insulin aspart  0-9 Units Subcutaneous TID WC  . pantoprazole  40 mg Oral Q0600  . sodium chloride flush  5 mL Intracatheter Q8H  . vitamin B-12  1,000 mcg Oral Daily   Continuous Infusions: . ampicillin-sulbactam (UNASYN) IV 3 g (12/14/17 0927)     LOS: 6 days    Time spent: 35 minutes.     Elmarie Shiley, MD [facility] Pager [phone]  If 7PM-7AM, please contact night-coverage [email or web address] Password [facility] 12/14/2017, 12:36 PM

## 2017-12-14 NOTE — Telephone Encounter (Signed)
The pt was advised to speak with the nursing staff at the hospital, he is currently admitted.

## 2017-12-14 NOTE — Progress Notes (Signed)
INFECTIOUS DISEASE PROGRESS NOTE  ID: Paul [name] is a 37 y.o. male with  Principal Problem:   Sepsis ([place]) Active Problems:   Essential hypertension   Necrotizing pancreatitis   Hyperglycemia   Pancreatic abscess   Pancreatic necrosis  Subjective: Having upper abd pain with deep breathing.  No rash, no diarrhea Eating well.   Abtx:  Anti-infectives (From admission, onward)   Start     Dose/Rate Route Frequency Ordered Stop   12/12/17 1500  Ampicillin-Sulbactam (UNASYN) 3 g in sodium chloride 0.9 % 100 mL IVPB     3 g 200 mL/hr over 30 Minutes Intravenous Every 6 hours 12/12/17 1135     12/09/17 1200  vancomycin (VANCOCIN) IVPB 750 mg/150 ml premix  Status:  Discontinued     750 mg 150 mL/hr over 60 Minutes Intravenous Every 8 hours 12/09/17 0401 12/09/17 1142   12/09/17 0600  meropenem (MERREM) 1 g in sodium chloride 0.9 % 100 mL IVPB  Status:  Discontinued     1 g 200 mL/hr over 30 Minutes Intravenous Every 8 hours 12/09/17 0254 12/12/17 1020   12/09/17 0415  vancomycin (VANCOCIN) 1,500 mg in sodium chloride 0.9 % 500 mL IVPB     1,500 mg 250 mL/hr over 120 Minutes Intravenous  Once 12/09/17 0401 12/09/17 1905   12/08/17 2330  piperacillin-tazobactam (ZOSYN) IVPB 3.375 g     3.375 g 100 mL/hr over 30 Minutes Intravenous  Once 12/08/17 2323 12/09/17 0038      Medications:  Scheduled: . allopurinol  100 mg Oral BID  . atenolol  25 mg Oral Daily  . feeding supplement (ENSURE ENLIVE)  237 mL Oral BID BM  . insulin aspart  0-9 Units Subcutaneous TID WC  . pantoprazole  40 mg Oral Q0600  . sodium chloride flush  5 mL Intracatheter Q8H  . vitamin B-12  1,000 mcg Oral Daily    Objective: Vital signs in last 24 hours: Temp:  [98.1 F (36.7 C)-98.8 F (37.1 C)] 98.1 F (36.7 C) (09/16 0756) Pulse Rate:  [58-76] 62 (09/16 0756) Resp:  [10-21] 10 (09/15 2346) BP: (122-149)/(91-97) 133/95 (09/16 0756) SpO2:  [95 %-100 %] 95 % (09/16 0756)   General appearance:  alert, cooperative and no distress Resp: clear to auscultation bilaterally Cardio: regular rate and rhythm GI: normal findings: bowel sounds normal, soft, non-tender and LUQ drain.   Lab Results Recent Labs    12/12/17 0850 12/13/17 0833  WBC 5.6 5.4  HGB 10.8* 10.8*  HCT 33.1* 33.3*  NA 135 139  K 4.1 3.5  CL 102 103  CO2 22 23  BUN 5* <5*  CREATININE 1.03 0.90   Liver Panel Recent Labs    12/13/17 0833  PROT 6.3*  ALBUMIN 2.8*  AST 28  ALT 38  ALKPHOS 99  BILITOT 0.7   Sedimentation Rate No results for input(s): ESRSEDRATE in the last 72 hours. C-Reactive Protein No results for input(s): CRP in the last 72 hours.  Microbiology: Recent Results (from the past 240 hour(s))  Culture, blood (routine x 2)     Status: None   Collection Time: 12/08/17 11:59 PM  Result Value Ref Range Status   Specimen Description BLOOD LEFT ANTECUBITAL  Final   Special Requests   Final    BOTTLES DRAWN AEROBIC AND ANAEROBIC Blood Culture adequate volume   Culture   Final    NO GROWTH 5 DAYS Performed at [facility], [address], [place], [place] [address]  Report Status 12/14/2017 FINAL  Final  Culture, blood (routine x 2)     Status: None   Collection Time: 12/09/17 12:00 AM  Result Value Ref Range Status   Specimen Description BLOOD RIGHT ANTECUBITAL  Final   Special Requests   Final    BOTTLES DRAWN AEROBIC AND ANAEROBIC Blood Culture results may not be optimal due to an inadequate volume of blood received in culture bottles   Culture   Final    NO GROWTH 5 DAYS Performed at [facility], [place] [address], [place], [place] [address]    Report Status 12/14/2017 FINAL  Final  MRSA PCR Screening     Status: None   Collection Time: 12/09/17  5:45 AM  Result Value Ref Range Status   MRSA by PCR NEGATIVE NEGATIVE Final    Comment:        The GeneXpert MRSA Assay (FDA approved for NASAL specimens only), is one component of a comprehensive MRSA  colonization surveillance program. It is not intended to diagnose MRSA infection nor to guide or monitor treatment for MRSA infections. Performed at [facility], [place] [address], [place], [place] [address]   Aerobic/Anaerobic Culture (surgical/deep wound)     Status: None (Preliminary result)   Collection Time: 12/13/17  1:13 PM  Result Value Ref Range Status   Specimen Description WOUND  Final   Special Requests Normal  Final   Gram Stain   Final    ABUNDANT WBC PRESENT, PREDOMINANTLY PMN MODERATE GRAM POSITIVE COCCI Performed at [facility], [place] [address] [place],  [address]    Culture PENDING  Incomplete   Report Status PENDING  Incomplete    Studies/Results: Ct Image Guided Drainage By Percutaneous Catheter  Result Date: 12/13/2017 INDICATION: History of pancreatic pseudocyst post failed attempted cyst-gastrostomy tube placement. Patient has been evaluated by the surgical service and request has been made for placement of a percutaneous drainage catheter for infection source control purposes. EXAM: CT IMAGE GUIDED DRAINAGE BY PERCUTANEOUS CATHETER COMPARISON:  CT abdomen and pelvis - 12/08/2017; 11/16/2017; 11/04/2017 MEDICATIONS: The patient is currently admitted to the hospital and receiving intravenous antibiotics. The antibiotics were administered within an appropriate time frame prior to the initiation of the procedure. ANESTHESIA/SEDATION: Moderate (conscious) sedation was employed during this procedure. A total of Versed 4 mg and Fentanyl 150 mcg was administered intravenously. Moderate Sedation Time: 22 minutes. The patient's level of consciousness and vital signs were monitored continuously by radiology nursing throughout the procedure under my direct supervision. CONTRAST:  None COMPLICATIONS: None immediate. PROCEDURE: Informed written consent was obtained from the patient after a discussion of the risks, benefits and alternatives to treatment. The patient  was placed supine, slightly RPO on the CT gantry and a pre procedural CT was performed re-demonstrating the known abscess/fluid collection within the pancreatic bed with dominant air and fluid containing pancreatic pseudocyst appearing slightly smaller since undergoing attempted cyst gastrostomy placement but with residual component measuring at least 10.9 x 4.0 cm (26, series 3). The procedure was planned. A timeout was performed prior to the initiation of the procedure. The skin overlying the left lateral abdomen was prepped and draped in the usual sterile fashion. The overlying soft tissues were anesthetized with 1% lidocaine with epinephrine. Appropriate trajectory was planned with the use of a 22 gauge spinal needle. An 18 gauge trocar needle was advanced into the abscess/fluid collection however the wire was noted to be coiled within the likely loculated lateral  component of the collection. As such, the trocar needle was exchanged for a 15 cm Yueh sheath catheter which was utilized to manipulate the Amplatz wire to the medial aspect of the pancreatic pseudocyst. Appropriate position was confirmed with CT imaging. The tract was serially dilated allowing placement of a 10 [place] all-purpose multi side-hole biliary drainage catheter. Appropriate positioning was confirmed with a limited postprocedural CT scan. 25 ml of brown-colored purulent appearing fluid was aspirated. The tube was connected to a JP bulb and sutured in place. A dressing was placed. The patient tolerated the procedure well without immediate post procedural complication. IMPRESSION: Successful CT guided placement of a 10 French all purpose multi side-hole biliary drain catheter into the pancreatic pseudocyst with aspiration of 25 cc of brown colored, purulent appearing fluid. Samples were sent to the laboratory as requested by the ordering clinical team. Electronically Signed   By: Sandi Mariscal M.D.   On: 12/13/2017 16:18      Assessment/Plan: Pancreatic pseudocyst  Total days of antibiotics: 6 (unasyn day 2)  Await results of his Cx (g/s shows GPC) He's afebrile and his WBC is normal.  I was unable to give a clear answer as to when his LUQ pain would improve when he asked.          Bobby Rumpf MD, FACP Infectious Diseases (pager) [phone] [email or web address] 12/14/2017, 8:17 AM  LOS: 6 days

## 2017-12-14 NOTE — Telephone Encounter (Signed)
See alternate note regarding pt care per Dr Rush Landmark

## 2017-12-14 NOTE — Progress Notes (Signed)
Referring Physician(s): Azucena Freed, PA-C  Supervising Physician: Markus Daft  Patient Status:  [facility] - In-pt  Chief Complaint: Follow-up percutaneous pancreatic drain placement 12/13/17 by Dr. Pascal Lux  Subjective:  Paul [name] is a 37 y.o. male with a past medical history of hypertension, high cholesterol, migraines, GERD, pancreatitis, and renal cell carcinoma. He was admitted 11/04/17 - 11/18/17 for idiopathic pancreatitis and nectrotizing pancreatitis for which he was discharged home on IV antibiotics.   He presented to [facility] ED on 12/08/2017 with complaints of fever and nausea. He underwent a CT abdomen/pelvis at that time which showed severe necrotizing pancreatitis, he was admitted for management. EUS with cyst gastrostomy was attempted by GI on 12/11/17 however they were unable to gain an adequate window for stent placement and IR was consulted on 12/13/17 for aspiration and drain placement of large pancreatic pseudocyst which was successfully performed that same day.  Patient reports some tugging type pain at insertion site with deep breathing only; he denies any other abdominal pain, nausea, vomiting, diarrhea, dyspnea, chest pain, fever or chills. He is concerned about how he will shower and return to work with drain in place. He is tolerating solid foods without issue.  Allergies: Honey  Medications: Prior to Admission medications   Medication Sig Start Date End Date Taking? Authorizing Provider  allopurinol (ZYLOPRIM) 100 MG tablet Take 100 mg by mouth 2 (two) times daily. 10/07/17  Yes [provider]  amLODipine (NORVASC) 2.5 MG tablet Take 2.5 mg by mouth at bedtime.  10/07/17  Yes [provider]  atenolol (TENORMIN) 50 MG tablet Take 50 mg by mouth daily. 10/27/17  Yes [provider]  ibuprofen (ADVIL,MOTRIN) 200 MG tablet Take 200 mg by mouth every 6 (six) hours as needed for fever.   Yes [provider]  insulin aspart (NOVOLOG) 100 UNIT/ML  FlexPen Before each meal 3 times a day, 140-199 - 2 units, 200-250 - 4 units, 251-299 - 6 units,  300-349 - 8 units,  350 or above 10 units. Insulin PEN if approved, provide syringes and needles if needed. Patient taking differently: Inject 0-10 Units into the skin 3 (three) times daily before meals. Per sliding scale: 140-199 - 2 units, 200-250 - 4 units, 251-299 - 6 units,  300-349 - 8 units,  350 or above 10 units. 11/18/17  Yes Florencia Reasons, MD  insulin glargine (LANTUS) 100 unit/mL SOPN Inject 0.15 mLs (15 Units total) into the skin at bedtime. Patient taking differently: Inject 15 Units into the skin at bedtime. Do not take if bs is 80 or lower 11/18/17  Yes Florencia Reasons, MD  vitamin B-12 (CYANOCOBALAMIN) 1000 MCG tablet Take 1,000 mcg by mouth daily.   Yes [provider]  Amino Acids-Protein Hydrolys (FEEDING SUPPLEMENT, PRO-STAT SUGAR FREE 64,) LIQD Take 30 mLs by mouth 3 (three) times daily between meals. Patient not taking: Reported on 12/08/2017 11/18/17   Florencia Reasons, MD  blood glucose meter kit and supplies Dispense based on patient and insurance preference. Use up to four times daily as directed. (FOR ICD-10 E10.9, E11.9). 11/18/17   Florencia Reasons, MD  Insulin Pen Needle 31G X 5 MM MISC For insulin injection, please provide a month supply. 11/18/17   Florencia Reasons, MD  LEVEMIR FLEXTOUCH 100 UNIT/ML Pen Inject 15 Units into the skin at bedtime. 11/27/17   [provider]     Vital Signs: BP (!) 133/95   Pulse 62   Temp 98.1 F (36.7 C) (Oral)  Resp 10   Ht _0  (1.778 m)   Wt 170 lb (77.1 kg)   SpO2 95%   BMI 24.39 kg/m   Physical Exam  Constitutional: He is oriented to person, place, and time. No distress.  HENT:  Head: Normocephalic.  Cardiovascular: Normal rate, regular rhythm and normal heart sounds.  Pulmonary/Chest: Effort normal and breath sounds normal.  Abdominal: Soft. Bowel sounds are normal. He exhibits no distension. There is tenderness (mild with palpation over  insertion site).  Left sided abdominal drain in place with ~8-10 cc of brown output; insertion site clean, dry, intact without erythema or edema. Small amounts of dried blood on dressing; no active bleeding.  Neurological: He is alert and oriented to person, place, and time.  Skin: Skin is warm and dry. He is not diaphoretic.  Psychiatric: He has a normal mood and affect. His behavior is normal. Judgment and thought content normal.  Nursing note and vitals reviewed.   Imaging: Ct Image Guided Drainage By Percutaneous Catheter  Result Date: 12/13/2017 INDICATION: History of pancreatic pseudocyst post failed attempted cyst-gastrostomy tube placement. Patient has been evaluated by the surgical service and request has been made for placement of a percutaneous drainage catheter for infection source control purposes. EXAM: CT IMAGE GUIDED DRAINAGE BY PERCUTANEOUS CATHETER COMPARISON:  CT abdomen and pelvis - 12/08/2017; 11/16/2017; 11/04/2017 MEDICATIONS: The patient is currently admitted to the hospital and receiving intravenous antibiotics. The antibiotics were administered within an appropriate time frame prior to the initiation of the procedure. ANESTHESIA/SEDATION: Moderate (conscious) sedation was employed during this procedure. A total of Versed 4 mg and Fentanyl 150 mcg was administered intravenously. Moderate Sedation Time: 22 minutes. The patient's level of consciousness and vital signs were monitored continuously by radiology nursing throughout the procedure under my direct supervision. CONTRAST:  None COMPLICATIONS: None immediate. PROCEDURE: Informed written consent was obtained from the patient after a discussion of the risks, benefits and alternatives to treatment. The patient was placed supine, slightly RPO on the CT gantry and a pre procedural CT was performed re-demonstrating the known abscess/fluid collection within the pancreatic bed with dominant air and fluid containing pancreatic  pseudocyst appearing slightly smaller since undergoing attempted cyst gastrostomy placement but with residual component measuring at least 10.9 x 4.0 cm (26, series 3). The procedure was planned. A timeout was performed prior to the initiation of the procedure. The skin overlying the left lateral abdomen was prepped and draped in the usual sterile fashion. The overlying soft tissues were anesthetized with 1% lidocaine with epinephrine. Appropriate trajectory was planned with the use of a 22 gauge spinal needle. An 18 gauge trocar needle was advanced into the abscess/fluid collection however the wire was noted to be coiled within the likely loculated lateral component of the collection. As such, the trocar needle was exchanged for a 15 cm Yueh sheath catheter which was utilized to manipulate the Amplatz wire to the medial aspect of the pancreatic pseudocyst. Appropriate position was confirmed with CT imaging. The tract was serially dilated allowing placement of a 10 [place] all-purpose multi side-hole biliary drainage catheter. Appropriate positioning was confirmed with a limited postprocedural CT scan. 25 ml of brown-colored purulent appearing fluid was aspirated. The tube was connected to a JP bulb and sutured in place. A dressing was placed. The patient tolerated the procedure well without immediate post procedural complication. IMPRESSION: Successful CT guided placement of a 10 [place] all purpose multi side-hole biliary drain catheter into the pancreatic pseudocyst with aspiration of  25 cc of brown colored, purulent appearing fluid. Samples were sent to the laboratory as requested by the ordering clinical team. Electronically Signed   By: Sandi Mariscal M.D.   On: 12/13/2017 16:18    Labs:  CBC: Recent Labs    12/11/17 0255 12/12/17 0850 12/13/17 0833 12/14/17 0832  WBC 4.3 5.6 5.4 6.7  HGB 9.6* 10.8* 10.8* 11.4*  HCT 29.8* 33.1* 33.3* 34.9*  PLT 190 256 243 303    COAGS: Recent Labs     11/13/17 0414 11/17/17 0803 11/24/17 1253 12/09/17 0313  INR 1.07 1.07 1.2* 1.37  APTT  --   --   --  35    BMP: Recent Labs    12/11/17 0255 12/12/17 0850 12/13/17 0833 12/14/17 0832  NA 137 135 139 137  K 3.7 4.1 3.5 3.9  CL 107 102 103 103  CO2 _0 GLUCOSE 121* 138* 146* 150*  BUN <5* 5* <5* 5*  CALCIUM 8.5* 8.6* 8.8* 9.0  CREATININE 1.00 1.03 0.90 0.97  GFRNONAA >60 >60 >60 >60  GFRAA >60 >60 >60 >60    LIVER FUNCTION TESTS: Recent Labs    12/09/17 0313 12/10/17 0618 12/13/17 0833 12/14/17 0832  BILITOT 2.0* 1.3* 0.7 0.7  AST 34 _1 ALT 38 31 38 37  ALKPHOS 97 88 99 103  PROT 6.1* 5.6* 6.3* 6.9  ALBUMIN 2.9* 2.4* 2.8* 3.0*    Assessment and Plan:  Pancreatic pseudocyst s/p percutaneous aspiration and drainage in IR yesterday for infection source control purposes. GI attempted EUS with cyst gastrostomy on 12/11/17 which was unsuccessful for stent placement.   Patient doing well, tolerating solid foods, minimal abdominal pain with deep breaths at drain insertion site. Drain output not recorded on I/O, patient states drain not yet flushed today, brown-colored drainage present in JP today. Afebrile, WBC 6.7, preliminary culture of pseudocyst fluid shows moderate gram positive cocci - he is followed by ID who has placed patient on Unasyn.   IR to continue to follow - continue TID flushes with 5 cc NS, record output daily. Discharge per admitting service, will follow up with IR in outpatient clinic 1-2 weeks after discharge from hospital.  Electronically Signed: Joaquim Nam, PA-C 12/14/2017, 3:44 PM   I spent a total of 15 Minutes at the the patient's bedside AND on the patient's hospital floor or unit, greater than 50% of which was counseling/coordinating care for pancreatic pseudocyst aspiration and drain placement.

## 2017-12-14 NOTE — Progress Notes (Addendum)
Daily Rounding Note  12/14/2017, 8:18 AM  LOS: 6 days   SUBJECTIVE:   Chief complaint: pancreatitis with necrosis     Pt continues to feel well, brief, tugging pain in region of per catheter when he breathes deep.  Continues to eat well.    OBJECTIVE:         Vital signs in last 24 hours:    Temp:  [98.1 F (36.7 C)-98.8 F (37.1 C)] 98.1 F (36.7 C) (09/16 0756) Pulse Rate:  [58-76] 62 (09/16 0756) Resp:  [10-21] 10 (09/15 2346) BP: (122-149)/(91-97) 133/95 (09/16 0756) SpO2:  [95 %-100 %] 95 % (09/16 0756) Last BM Date: 12/12/17 Filed Weights   12/09/17 0302  Weight: 77.1 kg   General: looks well, comfortable   Heart: RRR Chest: clear bil.  No cough or dyspnea Abdomen: soft, NT, ND.  JP bulb with opaque brown liquid.    Extremities: no CCE Neuro/Psych:  Oriented x 3.  Fully alert.  No weakness or deficits.  Relaxed.     Studies/Results: Ct Image Guided Drainage By Percutaneous Catheter  Result Date: 12/13/2017 INDICATION: History of pancreatic pseudocyst post failed attempted cyst-gastrostomy tube placement. Patient has been evaluated by the surgical service and request has been made for placement of a percutaneous drainage catheter for infection source control purposes. EXAM: CT IMAGE GUIDED DRAINAGE BY PERCUTANEOUS CATHETER COMPARISON:  CT abdomen and pelvis - 12/08/2017; 11/16/2017; 11/04/2017 MEDICATIONS: The patient is currently admitted to the hospital and receiving intravenous antibiotics. The antibiotics were administered within an appropriate time frame prior to the initiation of the procedure. ANESTHESIA/SEDATION: Moderate (conscious) sedation was employed during this procedure. A total of Versed 4 mg and Fentanyl 150 mcg was administered intravenously. Moderate Sedation Time: 22 minutes. The patient's level of consciousness and vital signs were monitored continuously by radiology nursing throughout the  procedure under my direct supervision. CONTRAST:  None COMPLICATIONS: None immediate. PROCEDURE: Informed written consent was obtained from the patient after a discussion of the risks, benefits and alternatives to treatment. The patient was placed supine, slightly RPO on the CT gantry and a pre procedural CT was performed re-demonstrating the known abscess/fluid collection within the pancreatic bed with dominant air and fluid containing pancreatic pseudocyst appearing slightly smaller since undergoing attempted cyst gastrostomy placement but with residual component measuring at least 10.9 x 4.0 cm (26, series 3). The procedure was planned. A timeout was performed prior to the initiation of the procedure. The skin overlying the left lateral abdomen was prepped and draped in the usual sterile fashion. The overlying soft tissues were anesthetized with 1% lidocaine with epinephrine. Appropriate trajectory was planned with the use of a 22 gauge spinal needle. An 18 gauge trocar needle was advanced into the abscess/fluid collection however the wire was noted to be coiled within the likely loculated lateral component of the collection. As such, the trocar needle was exchanged for a 15 cm Yueh sheath catheter which was utilized to manipulate the Amplatz wire to the medial aspect of the pancreatic pseudocyst. Appropriate position was confirmed with CT imaging. The tract was serially dilated allowing placement of a 10 [place] all-purpose multi side-hole biliary drainage catheter. Appropriate positioning was confirmed with a limited postprocedural CT scan. 25 ml of brown-colored purulent appearing fluid was aspirated. The tube was connected to a JP bulb and sutured in place. A dressing was placed. The patient tolerated the procedure well without immediate post procedural complication. IMPRESSION: Successful CT  guided placement of a 10 [place] all purpose multi side-hole biliary drain catheter into the pancreatic pseudocyst with  aspiration of 25 cc of brown colored, purulent appearing fluid. Samples were sent to the laboratory as requested by the ordering clinical team. Electronically Signed   By: Sandi Mariscal M.D.   On: 12/13/2017 16:18     ASSESMENT:   *  Necrotizing pancreatitis, pseudocyst.   9/13 unsuccesful EUS-cyst gastrostomy attempt.   9/15 IR perc drain placement.  Cyst fluid clx in progress, moderate GPC on stain.   Vanc/Meropenem x 4 d>> Unasyn day 2.  ID guiding abx.  Clinically doing well.  Afebrile for > 72 hours, WBCs normal for 6 d.     *   Normocytic anemia.  Low iron, low TIBC, ferritin 862.  B12 and Folate at or above normal.    *   DM 2.  New diagnosis as of 10/2017.     *  Hypoalbuminemia.  Eating solid food well   PLAN   *   ? When can he discharge, ? Timing of next repeat CT.  Will clarify this with Dr Carlean Purl and Mansouraty.    *  Await bug ID and fine tune abx if necessary.  *  Ask staff to measure/record drain output.      Azucena Freed  12/14/2017, 8:18 AM Phone [place] Attending   I have taken an interval history, reviewed the chart and examined the patient. I agree with the Advanced Practitioner's note, impression and recommendations.     We need to (hopefully) get an ID on the GPC and narrow the Abx and pick appropriate Abx for outpatient (vs IV at home) Hopeful DC 1-2 days Will get outpt GI f/u Dr. Rush Landmark arranged also Will need IR f/u  Needs CFTR gene testing as that is linked to renal cell and pancreatitis. Cannot order in hospital  Gatha Mayer, MD, [facility] 12/14/2017 4:02 PM

## 2017-12-15 ENCOUNTER — Telehealth: Payer: Self-pay | Admitting: Gastroenterology

## 2017-12-15 LAB — GLUCOSE, CAPILLARY
GLUCOSE-CAPILLARY: 191 mg/dL — AB (ref 70–99)
GLUCOSE-CAPILLARY: 157 mg/dL — AB (ref 70–99)
Glucose-Capillary: 152 mg/dL — ABNORMAL HIGH (ref 70–99)
Glucose-Capillary: 168 mg/dL — ABNORMAL HIGH (ref 70–99)

## 2017-12-15 LAB — CBC
HEMATOCRIT: 33 % — AB (ref 39.0–52.0)
HEMOGLOBIN: 10.7 g/dL — AB (ref 13.0–17.0)
MCH: 27.5 pg (ref 26.0–34.0)
MCHC: 32.4 g/dL (ref 30.0–36.0)
MCV: 84.8 fL (ref 78.0–100.0)
Platelets: 334 10*3/uL (ref 150–400)
RBC: 3.89 MIL/uL — ABNORMAL LOW (ref 4.22–5.81)
RDW: 13.4 % (ref 11.5–15.5)
WBC: 8 10*3/uL (ref 4.0–10.5)

## 2017-12-15 MED ORDER — AMLODIPINE BESYLATE 5 MG PO TABS
2.5000 mg | ORAL_TABLET | Freq: Every day | ORAL | Status: DC
  Administered 2017-12-15 – 2017-12-17 (×3): 2.5 mg via ORAL
  Filled 2017-12-15 (×2): qty 1

## 2017-12-15 NOTE — Progress Notes (Signed)
Pharmacy Antibiotic Note  Paul [name] is a 37 y.o. male admitted on 12/08/2017 with severe necrotizing pancreatitis and sepsis.  Pharmacy has been consulted for Unasyn dosing.  Patient was previously admitted for idiopathic pancreatitis last month and had necrotizing pancreatitis, eventually discharged home on IV antibiotics with last dose on December 01, 2017.   Renal function is stable. S/P perc pancreatic drain placement 9/15 - 20 cc drain output yesterday. Awaiting culture results.  Plan: Continue Unasyn 3g IV q6h Monitor renal function and C/S   Height: 5\' 10"  (177.8 cm) Weight: 166 lb 14.2 oz (75.7 kg) IBW/kg (Calculated) : 73  Temp (24hrs), Avg:98.3 F (36.8 C), Min:98.2 F (36.8 C), Max:98.5 F (36.9 C)  Recent Labs  Lab 12/08/17 1719 12/08/17 2011 12/09/17 0313 12/09/17 0615 12/10/17 0618 12/11/17 0255 12/12/17 0850 12/13/17 0833 12/14/17 0832 12/15/17 0753  WBC  --   --  7.3  --  5.4 4.3 5.6 5.4 6.7 8.0  CREATININE  --   --  1.17  --  1.10 1.00 1.03 0.90 0.97  --   LATICACIDVEN 1.96* 2.56* 1.9 1.1  --   --   --   --   --   --     Estimated Creatinine Clearance: 108.7 mL/min (by C-G formula based on SCr of 0.97 mg/dL).    Allergies  Allergen Reactions  . Honey Diarrhea and Nausea And Vomiting    Antimicrobials this admission: 9/11 Zosyn x 1 9/11 vancomycin >>9/11 9/11 Merrem >>9/14 9/14 Unasyn >>   Microbiology results: 9/11 MRSA PCR negative 9/11 Bcx: neg 9/10 Bcx: neg 9/15 Pseduocyst aspiration cx: mod GPC, reinc   Thank you for allowing pharmacy to be a part of this patient's care.  Renold Genta, PharmD, BCPS Clinical Pharmacist Clinical phone for 12/15/2017 until 3p is [phone] Please check AMION for all Pharmacist numbers by unit 12/15/2017 9:56 AM

## 2017-12-15 NOTE — Progress Notes (Signed)
PROGRESS NOTE    Paul [name]  FMB:[id number] DOB: 1980/05/04 DOA: 12/08/2017 PCP: Karleen Hampshire., MD    Brief Narrative:Grover Burnley is a 37 y.o. male with history of renal cell carcinoma status post left-sided nephrectomy in 2012 who was admitted for idiopathic pancreatitis last month and had necrotizing pancreatitis eventually discharged home on IV antibiotics last dose was on December 02, 2017 last week presents to the ER with complaints of fever and chills for last 2 days.  Denies any nausea vomiting abdominal pain diarrhea able to eat well denies any chest pain productive cough headache visual symptoms.  Patient also was discharged on insulin for hyperglycemia which patient states has improved and has stopped using long-acting insulin but last 2 days his blood sugar started increasing again.  ED Course: In the ER patient had a fever of 102 F chest x-ray and urine were unremarkable.  CT of the abdomen shows features of necrotizing pancreatitis with no definite abscess.  Blood cultures are obtained started on fluid per sepsis protocol and admitted for possible developing sepsis.  Likely source could be intra-abdominal.  Patient readmitted with necrotizing pancreatitis. He was restarted on meropenem. GI and ID consulted.  Patient underwent EUS, but unable to get adequate window for stent during EUS. IR consulted, he underwent  percutaneous drain placement into pancreatic pseudocyst by IR . Patient will be discharge with percutaneous drain and follow up with IR and Dr Rush Landmark. We are waiting for culture results to determine oral antibiotics for discharge.   Assessment & Plan:   Principal Problem:   Sepsis ([place]) Active Problems:   Essential hypertension   Necrotizing pancreatitis   Hyperglycemia   Pancreatic abscess   Pancreatic necrosis  1-Sepsis; Presents with fevers, leukocytosis. Source of infection probably necrotizing pancreatitis. Sepsis confirmed. UA negative Now Unasyn , tolerating  so far.  Blood culture. No growth to date. Elevated lactic acid, pro-calcitonin level.  Afebrile. Day 5 Unasyn.   2-Necrotizing pancreatitis;  CT shows inflammatory changes. GI consulted.  Treated with IV meropenem, now on Unasyn.  Unable to get adequate window for stent during EUS.  Underwent percutaneous drain placement into pancreatic pseudocyst by IR  Follow culture; gram positive cocci cluster. Results pending.   3-HTN; resume Norvasc. Continue with atenolol. decrease dose due to bradycardia.    4-Hyperglycemia; Continue with SSI. Might need long acting.   History of nephrectomy for left-sided renal cell carcinoma in 2012.  Hypokalemia; replaced.   Anemia; no melena. Iron deficiency.  Check occult blood. Negative  Need iron pills at discharge  Hb increasing   Hypoxemia; transiently. NSL  Incentive spirometry   Bradycardia; reduce dose of atenolol.   DVT prophylaxis: SCD.  Code Status: full code.  Family Communication: care discussed with patient  Disposition Plan: remain inpatient for IV antibiotics.    Consultants:   GI   ID   Procedures:  \none  Antimicrobials: meropenem.   Subjective: Report pain site of catheter has improved.  Denies abdominal pain.      Objective: Vitals:   12/14/17 2110 12/15/17 0500 12/15/17 0740 12/15/17 1000  BP: (!) 136/93  (!) 141/97 (!) 139/95  Pulse: 63  (!) 57 65  Resp: 12  20 18   Temp: 98.3 F (36.8 C)  98.2 F (36.8 C) 98 F (36.7 C)  TempSrc: Oral  Oral Oral  SpO2: 96%  96% 97%  Weight:  75.7 kg    Height:        Intake/Output Summary (Last 24 hours)  at 12/15/2017 1322 Last data filed at 12/15/2017 0600 Gross per 24 hour  Intake 450 ml  Output 20 ml  Net 430 ml   Filed Weights   12/09/17 0302 12/15/17 0500  Weight: 77.1 kg 75.7 kg    Examination:  General exam: NAD Respiratory system: CTA Cardiovascular system; S 1, S 2 RRR Gastrointestinal system: BS present, soft, nt, catheter in place.  Cover.  Central nervous system: Non focal.  Extremities: Symmetric power.  Skin: no rashes.    Data Reviewed: I have personally reviewed following labs and imaging studies  CBC: Recent Labs  Lab 12/09/17 0313  12/11/17 0255 12/12/17 0850 12/13/17 0833 12/14/17 0832 12/15/17 0753  WBC 7.3   < > 4.3 5.6 5.4 6.7 8.0  NEUTROABS 4.9  --   --   --   --   --   --   HGB 10.6*   < > 9.6* 10.8* 10.8* 11.4* 10.7*  HCT 33.1*   < > 29.8* 33.1* 33.3* 34.9* 33.0*  MCV 85.8   < > 85.6 85.1 85.2 84.5 84.8  PLT 162   < > 190 256 243 303 334   < > = values in this interval not displayed.   Basic Metabolic Panel: Recent Labs  Lab 12/10/17 0618 12/11/17 0255 12/12/17 0850 12/13/17 0833 12/14/17 0832  NA 135 137 135 139 137  K 3.2* 3.7 4.1 3.5 3.9  CL 107 107 102 103 103  CO2 19* 23 22 23 25   GLUCOSE 133* 121* 138* 146* 150*  BUN 8 <5* 5* <5* 5*  CREATININE 1.10 1.00 1.03 0.90 0.97  CALCIUM 7.9* 8.5* 8.6* 8.8* 9.0   GFR: Estimated Creatinine Clearance: 108.7 mL/min (by C-G formula based on SCr of 0.97 mg/dL). Liver Function Tests: Recent Labs  Lab 12/08/17 1659 12/09/17 0313 12/10/17 0618 12/13/17 0833 12/14/17 0832  AST 42* 34 26 28 27   ALT 41 38 31 38 37  ALKPHOS 113 97 88 99 103  BILITOT 1.5* 2.0* 1.3* 0.7 0.7  PROT 7.2 6.1* 5.6* 6.3* 6.9  ALBUMIN 3.4* 2.9* 2.4* 2.8* 3.0*   Recent Labs  Lab 12/08/17 1659  LIPASE 41   No results for input(s): AMMONIA in the last 168 hours. Coagulation Profile: Recent Labs  Lab 12/09/17 0313  INR 1.37   Cardiac Enzymes: No results for input(s): CKTOTAL, CKMB, CKMBINDEX, TROPONINI in the last 168 hours. BNP (last 3 results) No results for input(s): PROBNP in the last 8760 hours. HbA1C: No results for input(s): HGBA1C in the last 72 hours. CBG: Recent Labs  Lab 12/14/17 1210 12/14/17 1714 12/14/17 2107 12/15/17 0737 12/15/17 1147  GLUCAP 146* 154* 190* 152* 191*   Lipid Profile: No results for input(s): CHOL, HDL,  LDLCALC, TRIG, CHOLHDL, LDLDIRECT in the last 72 hours. Thyroid Function Tests: No results for input(s): TSH, T4TOTAL, FREET4, T3FREE, THYROIDAB in the last 72 hours. Anemia Panel: No results for input(s): VITAMINB12, FOLATE, FERRITIN, TIBC, IRON, RETICCTPCT in the last 72 hours. Sepsis Labs: Recent Labs  Lab 12/08/17 1719 12/08/17 2011 12/09/17 0313 12/09/17 0615  PROCALCITON  --   --  1.29  --   LATICACIDVEN 1.96* 2.56* 1.9 1.1    Recent Results (from the past 240 hour(s))  Culture, blood (routine x 2)     Status: None   Collection Time: 12/08/17 11:59 PM  Result Value Ref Range Status   Specimen Description BLOOD LEFT ANTECUBITAL  Final   Special Requests   Final    BOTTLES DRAWN  AEROBIC AND ANAEROBIC Blood Culture adequate volume   Culture   Final    NO GROWTH 5 DAYS Performed at [facility], [place] [address], [place], [place] [address]    Report Status 12/14/2017 FINAL  Final  Culture, blood (routine x 2)     Status: None   Collection Time: 12/09/17 12:00 AM  Result Value Ref Range Status   Specimen Description BLOOD RIGHT ANTECUBITAL  Final   Special Requests   Final    BOTTLES DRAWN AEROBIC AND ANAEROBIC Blood Culture results may not be optimal due to an inadequate volume of blood received in culture bottles   Culture   Final    NO GROWTH 5 DAYS Performed at [facility], [place] [address], [place], [place] [address]    Report Status 12/14/2017 FINAL  Final  MRSA PCR Screening     Status: None   Collection Time: 12/09/17  5:45 AM  Result Value Ref Range Status   MRSA by PCR NEGATIVE NEGATIVE Final    Comment:        The GeneXpert MRSA Assay (FDA approved for NASAL specimens only), is one component of a comprehensive MRSA colonization surveillance program. It is not intended to diagnose MRSA infection nor to guide or monitor treatment for MRSA infections. Performed at [facility], [place] [address], [place], [place] [address]     Aerobic/Anaerobic Culture (surgical/deep wound)     Status: None (Preliminary result)   Collection Time: 12/13/17  1:13 PM  Result Value Ref Range Status   Specimen Description WOUND  Final   Special Requests Normal  Final   Gram Stain   Final    ABUNDANT WBC PRESENT, PREDOMINANTLY PMN MODERATE GRAM POSITIVE COCCI    Culture   Final    CULTURE REINCUBATED FOR BETTER GROWTH Performed at [facility], [place] [address] [place], [place] [address]    Report Status PENDING  Incomplete         Radiology Studies: Ct Image Guided Drainage By Percutaneous Catheter  Result Date: 12/13/2017 INDICATION: History of pancreatic pseudocyst post failed attempted cyst-gastrostomy tube placement. Patient has been evaluated by the surgical service and request has been made for placement of a percutaneous drainage catheter for infection source control purposes. EXAM: CT IMAGE GUIDED DRAINAGE BY PERCUTANEOUS CATHETER COMPARISON:  CT abdomen and pelvis - 12/08/2017; 11/16/2017; 11/04/2017 MEDICATIONS: The patient is currently admitted to the hospital and receiving intravenous antibiotics. The antibiotics were administered within an appropriate time frame prior to the initiation of the procedure. ANESTHESIA/SEDATION: Moderate (conscious) sedation was employed during this procedure. A total of Versed 4 mg and Fentanyl 150 mcg was administered intravenously. Moderate Sedation Time: 22 minutes. The patient's level of consciousness and vital signs were monitored continuously by radiology nursing throughout the procedure under my direct supervision. CONTRAST:  None COMPLICATIONS: None immediate. PROCEDURE: Informed written consent was obtained from the patient after a discussion of the risks, benefits and alternatives to treatment. The patient was placed supine, slightly RPO on the CT gantry and a pre procedural CT was performed re-demonstrating the known abscess/fluid collection within the pancreatic bed with  dominant air and fluid containing pancreatic pseudocyst appearing slightly smaller since undergoing attempted cyst gastrostomy placement but with residual component measuring at least 10.9 x 4.0 cm (26, series 3). The procedure was planned. A timeout was performed prior to the initiation of the procedure. The skin overlying the left lateral abdomen was prepped and draped in  the usual sterile fashion. The overlying soft tissues were anesthetized with 1% lidocaine with epinephrine. Appropriate trajectory was planned with the use of a 22 gauge spinal needle. An 18 gauge trocar needle was advanced into the abscess/fluid collection however the wire was noted to be coiled within the likely loculated lateral component of the collection. As such, the trocar needle was exchanged for a 15 cm Yueh sheath catheter which was utilized to manipulate the Amplatz wire to the medial aspect of the pancreatic pseudocyst. Appropriate position was confirmed with CT imaging. The tract was serially dilated allowing placement of a 10 [place] all-purpose multi side-hole biliary drainage catheter. Appropriate positioning was confirmed with a limited postprocedural CT scan. 25 ml of brown-colored purulent appearing fluid was aspirated. The tube was connected to a JP bulb and sutured in place. A dressing was placed. The patient tolerated the procedure well without immediate post procedural complication. IMPRESSION: Successful CT guided placement of a 10 French all purpose multi side-hole biliary drain catheter into the pancreatic pseudocyst with aspiration of 25 cc of brown colored, purulent appearing fluid. Samples were sent to the laboratory as requested by the ordering clinical team. Electronically Signed   By: Sandi Mariscal M.D.   On: 12/13/2017 16:18        Scheduled Meds: . allopurinol  100 mg Oral BID  . amLODipine  2.5 mg Oral Daily  . atenolol  25 mg Oral Daily  . feeding supplement (ENSURE ENLIVE)  237 mL Oral BID BM  .  insulin aspart  0-9 Units Subcutaneous TID WC  . pantoprazole  40 mg Oral Q0600  . sodium chloride flush  5 mL Intracatheter Q8H  . vitamin B-12  1,000 mcg Oral Daily   Continuous Infusions: . ampicillin-sulbactam (UNASYN) IV 3 g (12/15/17 0940)     LOS: 7 days    Time spent: 35 minutes.     Elmarie Shiley, MD [facility] Pager [phone]  If 7PM-7AM, please contact night-coverage [email or web address] Password [email or web address] 12/15/2017, 1:22 PM

## 2017-12-15 NOTE — Progress Notes (Signed)
INFECTIOUS DISEASE PROGRESS NOTE  ID: Paul [name] is a 37 y.o. male with  Principal Problem:   Sepsis ([place]) Active Problems:   Essential hypertension   Necrotizing pancreatitis   Hyperglycemia   Pancreatic abscess   Pancreatic necrosis  Subjective: No complaints, pain better.   Abtx:  Anti-infectives (From admission, onward)   Start     Dose/Rate Route Frequency Ordered Stop   12/12/17 1500  Ampicillin-Sulbactam (UNASYN) 3 g in sodium chloride 0.9 % 100 mL IVPB     3 g 200 mL/hr over 30 Minutes Intravenous Every 6 hours 12/12/17 1135     12/09/17 1200  vancomycin (VANCOCIN) IVPB 750 mg/150 ml premix  Status:  Discontinued     750 mg 150 mL/hr over 60 Minutes Intravenous Every 8 hours 12/09/17 0401 12/09/17 1142   12/09/17 0600  meropenem (MERREM) 1 g in sodium chloride 0.9 % 100 mL IVPB  Status:  Discontinued     1 g 200 mL/hr over 30 Minutes Intravenous Every 8 hours 12/09/17 0254 12/12/17 1020   12/09/17 0415  vancomycin (VANCOCIN) 1,500 mg in sodium chloride 0.9 % 500 mL IVPB     1,500 mg 250 mL/hr over 120 Minutes Intravenous  Once 12/09/17 0401 12/09/17 1905   12/08/17 2330  piperacillin-tazobactam (ZOSYN) IVPB 3.375 g     3.375 g 100 mL/hr over 30 Minutes Intravenous  Once 12/08/17 2323 12/09/17 0038      Medications:  Scheduled: . allopurinol  100 mg Oral BID  . atenolol  25 mg Oral Daily  . feeding supplement (ENSURE ENLIVE)  237 mL Oral BID BM  . insulin aspart  0-9 Units Subcutaneous TID WC  . pantoprazole  40 mg Oral Q0600  . sodium chloride flush  5 mL Intracatheter Q8H  . vitamin B-12  1,000 mcg Oral Daily    Objective: Vital signs in last 24 hours: Temp:  [98.2 F (36.8 C)-98.5 F (36.9 C)] 98.2 F (36.8 C) (09/17 0740) Pulse Rate:  [57-63] 57 (09/17 0740) Resp:  [12-20] 20 (09/17 0740) BP: (136-142)/(82-101) 141/97 (09/17 0740) SpO2:  [95 %-96 %] 96 % (09/17 0740) Weight:  [75.7 kg] 75.7 kg (09/17 0500)   General appearance: alert,  cooperative and no distress Resp: clear to auscultation bilaterally Cardio: regular rate and rhythm GI: normal findings: bowel sounds normal and soft, non-tender Extremities: edema none  Lab Results Recent Labs    12/13/17 0833 12/14/17 0832  WBC 5.4 6.7  HGB 10.8* 11.4*  HCT 33.3* 34.9*  NA 139 137  K 3.5 3.9  CL 103 103  CO2 23 25  BUN <5* 5*  CREATININE 0.90 0.97   Liver Panel Recent Labs    12/13/17 0833 12/14/17 0832  PROT 6.3* 6.9  ALBUMIN 2.8* 3.0*  AST 28 27  ALT 38 37  ALKPHOS 99 103  BILITOT 0.7 0.7   Sedimentation Rate No results for input(s): ESRSEDRATE in the last 72 hours. C-Reactive Protein No results for input(s): CRP in the last 72 hours.  Microbiology: Recent Results (from the past 240 hour(s))  Culture, blood (routine x 2)     Status: None   Collection Time: 12/08/17 11:59 PM  Result Value Ref Range Status   Specimen Description BLOOD LEFT ANTECUBITAL  Final   Special Requests   Final    BOTTLES DRAWN AEROBIC AND ANAEROBIC Blood Culture adequate volume   Culture   Final    NO GROWTH 5 DAYS Performed at [facility], [address] [place], [place] [address]    Report Status 12/14/2017 FINAL  Final  Culture, blood (routine x 2)     Status: None   Collection Time: 12/09/17 12:00 AM  Result Value Ref Range Status   Specimen Description BLOOD RIGHT ANTECUBITAL  Final   Special Requests   Final    BOTTLES DRAWN AEROBIC AND ANAEROBIC Blood Culture results may not be optimal due to an inadequate volume of blood received in culture bottles   Culture   Final    NO GROWTH 5 DAYS Performed at [facility], [place] [address], [place], [place] [address]    Report Status 12/14/2017 FINAL  Final  MRSA PCR Screening     Status: None   Collection Time: 12/09/17  5:45 AM  Result Value Ref Range Status   MRSA by PCR NEGATIVE NEGATIVE Final    Comment:        The GeneXpert MRSA Assay (FDA approved for NASAL specimens only), is one  component of a comprehensive MRSA colonization surveillance program. It is not intended to diagnose MRSA infection nor to guide or monitor treatment for MRSA infections. Performed at [facility], [place] [address], [place], [place] [address]   Aerobic/Anaerobic Culture (surgical/deep wound)     Status: None (Preliminary result)   Collection Time: 12/13/17  1:13 PM  Result Value Ref Range Status   Specimen Description WOUND  Final   Special Requests Normal  Final   Gram Stain   Final    ABUNDANT WBC PRESENT, PREDOMINANTLY PMN MODERATE GRAM POSITIVE COCCI    Culture   Final    CULTURE REINCUBATED FOR BETTER GROWTH Performed at [facility], [place] [address] [place], [place] [address]    Report Status PENDING  Incomplete    Studies/Results: Ct Image Guided Drainage By Percutaneous Catheter  Result Date: 12/13/2017 INDICATION: History of pancreatic pseudocyst post failed attempted cyst-gastrostomy tube placement. Patient has been evaluated by the surgical service and request has been made for placement of a percutaneous drainage catheter for infection source control purposes. EXAM: CT IMAGE GUIDED DRAINAGE BY PERCUTANEOUS CATHETER COMPARISON:  CT abdomen and pelvis - 12/08/2017; 11/16/2017; 11/04/2017 MEDICATIONS: The patient is currently admitted to the hospital and receiving intravenous antibiotics. The antibiotics were administered within an appropriate time frame prior to the initiation of the procedure. ANESTHESIA/SEDATION: Moderate (conscious) sedation was employed during this procedure. A total of Versed 4 mg and Fentanyl 150 mcg was administered intravenously. Moderate Sedation Time: 22 minutes. The patient's level of consciousness and vital signs were monitored continuously by radiology nursing throughout the procedure under my direct supervision. CONTRAST:  None COMPLICATIONS: None immediate. PROCEDURE: Informed written consent was obtained from the patient after a  discussion of the risks, benefits and alternatives to treatment. The patient was placed supine, slightly RPO on the CT gantry and a pre procedural CT was performed re-demonstrating the known abscess/fluid collection within the pancreatic bed with dominant air and fluid containing pancreatic pseudocyst appearing slightly smaller since undergoing attempted cyst gastrostomy placement but with residual component measuring at least 10.9 x 4.0 cm (26, series 3). The procedure was planned. A timeout was performed prior to the initiation of the procedure. The skin overlying the left lateral abdomen was prepped and draped in the usual sterile fashion. The overlying soft tissues were anesthetized with 1% lidocaine with epinephrine. Appropriate trajectory was planned with the use of a 22 gauge spinal needle. An 18 gauge trocar needle was  advanced into the abscess/fluid collection however the wire was noted to be coiled within the likely loculated lateral component of the collection. As such, the trocar needle was exchanged for a 15 cm Yueh sheath catheter which was utilized to manipulate the Amplatz wire to the medial aspect of the pancreatic pseudocyst. Appropriate position was confirmed with CT imaging. The tract was serially dilated allowing placement of a 10 [place] all-purpose multi side-hole biliary drainage catheter. Appropriate positioning was confirmed with a limited postprocedural CT scan. 25 ml of brown-colored purulent appearing fluid was aspirated. The tube was connected to a JP bulb and sutured in place. A dressing was placed. The patient tolerated the procedure well without immediate post procedural complication. IMPRESSION: Successful CT guided placement of a 10 French all purpose multi side-hole biliary drain catheter into the pancreatic pseudocyst with aspiration of 25 cc of brown colored, purulent appearing fluid. Samples were sent to the laboratory as requested by the ordering clinical team. Electronically  Signed   By: Sandi Mariscal M.D.   On: 12/13/2017 16:18     Assessment/Plan: Pancreatic pseudocyst  Total days of antibiotics: 7 (unasyn day 3)  Doing well Cx from 9-15 gram stain shows GPC. Awaiting Cx data.  Continue unasyn Will continue to watch.          Bobby Rumpf MD, FACP Infectious Diseases (pager) [phone] [email or web address] 12/15/2017, 8:13 AM  LOS: 7 days

## 2017-12-15 NOTE — Progress Notes (Addendum)
          Daily Rounding Note  12/15/2017, 8:00 AM  LOS: 7 days   SUBJECTIVE:   Chief complaint: pancreatic pseudocyst     Drainage of 20 ml recorded in last 24 hours.   Still some tugging type pain at region of drain insertion and with deeper breathing.  no nausea.  Feels well.    Continues to eat very well.    OBJECTIVE:         Vital signs in last 24 hours:    Temp:  [98.2 F (36.8 C)-98.5 F (36.9 C)] 98.2 F (36.8 C) (09/17 0740) Pulse Rate:  [57-63] 57 (09/17 0740) Resp:  [12-20] 20 (09/17 0740) BP: (136-142)/(82-101) 141/97 (09/17 0740) SpO2:  [95 %-96 %] 96 % (09/17 0740) Weight:  [75.7 kg] 75.7 kg (09/17 0500) Last BM Date: 12/14/17 Filed Weights   12/09/17 0302 12/15/17 0500  Weight: 77.1 kg 75.7 kg   General: pleasant, looks well.     Heart: RRR  Chest: clear bil.  No dyspnea or cough Abdomen: soft, NT. ND.  + BS.    Extremities: no CCE Neuro/Psych:  Oriented x 3.  Pleasant.    Intake/Output from previous day: 09/16 0701 - 09/17 0700 In: 28 [P.O.:480; I.V.:5; IV Piggyback:400] Out: 20 [Drains:20]    ASSESMENT:   *  Necrotizing pancreatitis, pseudocyst.  Source of pancreatitis not clear but did have GB sludge on ultrasound.  9/13 unsuccesful EUS-cyst gastrostomy attempt.   9/15 IR perc drain placement.  Cyst fluid with moderate GPC on stain, final clx still pending.   Vanc/Meropenem x 4 d >> Unasyn day 3.  ID guiding abx.  Clinically doing well.  Afebrile for > 4 days, WBCs normal for 7 d.   *   Normocytic anemia.  Steadily improving.  Has not required PRBCs.     PLAN   *  GI ROV with Dr Rush Landmark 10/11 at 10:30.   *  IR planning outpt fup within 1-2 weeks of discharge.     *   Outpt CFTR gene.    *  Hopefully will soon have clear id of organism in pseudocyst to fine tune abx.    Azucena Freed  12/15/2017, 8:00 AM Phone [phone]     [facility] Attending   I have taken an interval  history, reviewed the chart and examined the patient. I agree with the Advanced Practitioner's note, impression and recommendations.   20 cc+ out so far today so drainage increasing  Need to arrange a CT scan f/u before he sees Dr. Rush Landmark - suspect IR will do that  But will make sure  Gatha Mayer, MD, [facility] 12/15/2017 6:30 PM

## 2017-12-15 NOTE — Telephone Encounter (Signed)
Note faxed.

## 2017-12-15 NOTE — Progress Notes (Signed)
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 Pascal Lux. Patient awake and alert laying in bed with no complaints at this time. Accompanied by wife at bedside. Pancreatic cyst drain c/d/i with minimal brown fluid in JP drain.   Allergies: Honey  Medications: Prior to Admission medications   Medication Sig Start Date End Date Taking? Authorizing Provider  allopurinol (ZYLOPRIM) 100 MG tablet Take 100 mg by mouth 2 (two) times daily. 10/07/17  Yes [provider]  amLODipine (NORVASC) 2.5 MG tablet Take 2.5 mg by mouth at bedtime.  10/07/17  Yes [provider]  atenolol (TENORMIN) 50 MG tablet Take 50 mg by mouth daily. 10/27/17  Yes [provider]  ibuprofen (ADVIL,MOTRIN) 200 MG tablet Take 200 mg by mouth every 6 (six) hours as needed for fever.   Yes [provider]  insulin aspart (NOVOLOG) 100 UNIT/ML FlexPen Before each meal 3 times a day, 140-199 - 2 units, 200-250 - 4 units, 251-299 - 6 units,  300-349 - 8 units,  350 or above 10 units. Insulin PEN if approved, provide syringes and needles if needed. Patient taking differently: Inject 0-10 Units into the skin 3 (three) times daily before meals. Per sliding scale: 140-199 - 2 units, 200-250 - 4 units, 251-299 - 6 units,  300-349 - 8 units,  350 or above 10 units. 11/18/17  Yes Florencia Reasons, MD  insulin glargine (LANTUS) 100 unit/mL SOPN Inject 0.15 mLs (15 Units total) into the skin at bedtime. Patient taking differently: Inject 15 Units into the skin at bedtime. Do not take if bs is 80 or lower 11/18/17  Yes Florencia Reasons, MD  vitamin B-12 (CYANOCOBALAMIN) 1000 MCG tablet Take 1,000 mcg by mouth daily.   Yes [provider]  Amino Acids-Protein Hydrolys (FEEDING SUPPLEMENT, PRO-STAT SUGAR FREE 64,) LIQD Take 30 mLs by mouth 3 (three)  times daily between meals. Patient not taking: Reported on 12/08/2017 11/18/17   Florencia Reasons, MD  blood glucose meter kit and supplies Dispense based on patient and insurance preference. Use up to four times daily as directed. (FOR ICD-10 E10.9, E11.9). 11/18/17   Florencia Reasons, MD  Insulin Pen Needle 31G X 5 MM MISC For insulin injection, please provide a month supply. 11/18/17   Florencia Reasons, MD  LEVEMIR FLEXTOUCH 100 UNIT/ML Pen Inject 15 Units into the skin at bedtime. 11/27/17   [provider]     Vital Signs: BP (!) 139/95 (BP Location: Right Arm)   Pulse 65   Temp 98 F (36.7 C) (Oral)   Resp 18   Ht '5\' 10"'$  (1.778 m)   Wt 166 lb 14.2 oz (75.7 kg)   SpO2 97%   BMI 23.95 kg/m   Physical Exam  Constitutional: He is oriented to person, place, and time. He appears well-developed and well-nourished. No distress.  Pulmonary/Chest: Effort normal. No respiratory distress.  Abdominal: Soft. There is no tenderness.  Pancreatic drain incision site without erythema, drainage, or tenderness; minimal brown fluid in JP drain; drain flushes/aspirates without resistance.  Neurological: He is alert and oriented to person, place, and time.  Skin: Skin is warm and dry.  Psychiatric: He has a normal mood and affect. His behavior is normal. Judgment and thought content normal.  Nursing note and vitals reviewed.   Imaging: Ct Image Guided Drainage By Percutaneous Catheter  Result  Date: 12/13/2017 INDICATION: History of pancreatic pseudocyst post failed attempted cyst-gastrostomy tube placement. Patient has been evaluated by the surgical service and request has been made for placement of a percutaneous drainage catheter for infection source control purposes. EXAM: CT IMAGE GUIDED DRAINAGE BY PERCUTANEOUS CATHETER COMPARISON:  CT abdomen and pelvis - 12/08/2017; 11/16/2017; 11/04/2017 MEDICATIONS: The patient is currently admitted to the hospital and receiving intravenous antibiotics. The antibiotics were  administered within an appropriate time frame prior to the initiation of the procedure. ANESTHESIA/SEDATION: Moderate (conscious) sedation was employed during this procedure. A total of Versed 4 mg and Fentanyl 150 mcg was administered intravenously. Moderate Sedation Time: 22 minutes. The patient's level of consciousness and vital signs were monitored continuously by radiology nursing throughout the procedure under my direct supervision. CONTRAST:  None COMPLICATIONS: None immediate. PROCEDURE: Informed written consent was obtained from the patient after a discussion of the risks, benefits and alternatives to treatment. The patient was placed supine, slightly RPO on the CT gantry and a pre procedural CT was performed re-demonstrating the known abscess/fluid collection within the pancreatic bed with dominant air and fluid containing pancreatic pseudocyst appearing slightly smaller since undergoing attempted cyst gastrostomy placement but with residual component measuring at least 10.9 x 4.0 cm (26, series 3). The procedure was planned. A timeout was performed prior to the initiation of the procedure. The skin overlying the left lateral abdomen was prepped and draped in the usual sterile fashion. The overlying soft tissues were anesthetized with 1% lidocaine with epinephrine. Appropriate trajectory was planned with the use of a 22 gauge spinal needle. An 18 gauge trocar needle was advanced into the abscess/fluid collection however the wire was noted to be coiled within the likely loculated lateral component of the collection. As such, the trocar needle was exchanged for a 15 cm Yueh sheath catheter which was utilized to manipulate the Amplatz wire to the medial aspect of the pancreatic pseudocyst. Appropriate position was confirmed with CT imaging. The tract was serially dilated allowing placement of a 10 [place] all-purpose multi side-hole biliary drainage catheter. Appropriate positioning was confirmed with a  limited postprocedural CT scan. 25 ml of brown-colored purulent appearing fluid was aspirated. The tube was connected to a JP bulb and sutured in place. A dressing was placed. The patient tolerated the procedure well without immediate post procedural complication. IMPRESSION: Successful CT guided placement of a 10 French all purpose multi side-hole biliary drain catheter into the pancreatic pseudocyst with aspiration of 25 cc of brown colored, purulent appearing fluid. Samples were sent to the laboratory as requested by the ordering clinical team. Electronically Signed   By: Sandi Mariscal M.D.   On: 12/13/2017 16:18    Labs:  CBC: Recent Labs    12/12/17 0850 12/13/17 0833 12/14/17 0832 12/15/17 0753  WBC 5.6 5.4 6.7 8.0  HGB 10.8* 10.8* 11.4* 10.7*  HCT 33.1* 33.3* 34.9* 33.0*  PLT 256 243 303 334    COAGS: Recent Labs    11/13/17 0414 11/17/17 0803 11/24/17 1253 12/09/17 0313  INR 1.07 1.07 1.2* 1.37  APTT  --   --   --  35    BMP: Recent Labs    12/11/17 0255 12/12/17 0850 12/13/17 0833 12/14/17 0832  NA 137 135 139 137  K 3.7 4.1 3.5 3.9  CL 107 102 103 103  CO2 '23 22 23 25  '$ GLUCOSE 121* 138* 146* 150*  BUN <5* 5* <5* 5*  CALCIUM 8.5* 8.6* 8.8* 9.0  CREATININE 1.00  1.03 0.90 0.97  GFRNONAA >60 >60 >60 >60  GFRAA >60 >60 >60 >60    LIVER FUNCTION TESTS: Recent Labs    12/09/17 0313 12/10/17 0618 12/13/17 0833 12/14/17 0832  BILITOT 2.0* 1.3* 0.7 0.7  AST 34 '26 28 27  '$ ALT 38 31 38 37  ALKPHOS 97 88 99 103  PROT 6.1* 5.6* 6.3* 6.9  ALBUMIN 2.9* 2.4* 2.8* 3.0*    Assessment and Plan:  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 Pascal Lux. Pancreatic drain stable with minimal brown fluid in JP drain. Continue with Qshift flushes and monitoring of output. IR to follow.   Electronically Signed: Earley Abide, PA-C 12/15/2017, 10:55 AM   I spent a total of 15 Minutes at the the patient's bedside AND on the patient's hospital floor or  unit, greater than 50% of which was counseling/coordinating care for pancreatic necrosis s/p drain placement.

## 2017-12-16 ENCOUNTER — Ambulatory Visit: Payer: 59 | Admitting: Gastroenterology

## 2017-12-16 DIAGNOSIS — A419 Sepsis, unspecified organism: Principal | ICD-10-CM

## 2017-12-16 DIAGNOSIS — I10 Essential (primary) hypertension: Secondary | ICD-10-CM

## 2017-12-16 LAB — CBC
HCT: 35.2 % — ABNORMAL LOW (ref 39.0–52.0)
HEMOGLOBIN: 11.3 g/dL — AB (ref 13.0–17.0)
MCH: 27.2 pg (ref 26.0–34.0)
MCHC: 32.1 g/dL (ref 30.0–36.0)
MCV: 84.8 fL (ref 78.0–100.0)
Platelets: 355 10*3/uL (ref 150–400)
RBC: 4.15 MIL/uL — AB (ref 4.22–5.81)
RDW: 13.3 % (ref 11.5–15.5)
WBC: 7.1 10*3/uL (ref 4.0–10.5)

## 2017-12-16 LAB — GLUCOSE, CAPILLARY
GLUCOSE-CAPILLARY: 203 mg/dL — AB (ref 70–99)
Glucose-Capillary: 146 mg/dL — ABNORMAL HIGH (ref 70–99)
Glucose-Capillary: 166 mg/dL — ABNORMAL HIGH (ref 70–99)
Glucose-Capillary: 147 mg/dL — ABNORMAL HIGH (ref 70–99)

## 2017-12-16 LAB — BASIC METABOLIC PANEL
Anion gap: 12 (ref 5–15)
BUN: 6 mg/dL (ref 6–20)
CHLORIDE: 102 mmol/L (ref 98–111)
CO2: 23 mmol/L (ref 22–32)
CREATININE: 0.89 mg/dL (ref 0.61–1.24)
Calcium: 9 mg/dL (ref 8.9–10.3)
GFR calc Af Amer: >60 mL/min (ref >60)
GFR calc non Af Amer: >60 mL/min (ref >60)
GLUCOSE: 148 mg/dL — AB (ref 70–99)
POTASSIUM: 3.6 mmol/L (ref 3.5–5.1)
Sodium: 137 mmol/L (ref 135–145)

## 2017-12-16 NOTE — Progress Notes (Signed)
PROGRESS NOTE    Welcome Paul [name]  HYQ:[id number] DOB: [birth year] DOA: 12/08/2017 PCP: Karleen Hampshire., MD    Brief Narrative:Paul [name] is a 37 y.o. male with history of renal cell carcinoma status post left-sided nephrectomy in 2012 who was admitted for idiopathic pancreatitis last month and had necrotizing pancreatitis eventually discharged home on IV antibiotics last dose was on December 02, 2017 last week presents to the ER with complaints of fever and chills for last 2 days.  Denies any nausea vomiting abdominal pain diarrhea able to eat well denies any chest pain productive cough headache visual symptoms.  Patient also was discharged on insulin for hyperglycemia which patient states has improved and has stopped using long-acting insulin but last 2 days his blood sugar started increasing again.  ED Course: In the ER patient had a fever of 102 F chest x-ray and urine were unremarkable.  CT of the abdomen shows features of necrotizing pancreatitis with no definite abscess.  Blood cultures are obtained started on fluid per sepsis protocol and admitted for possible developing sepsis.  Likely source could be intra-abdominal.  Patient readmitted with necrotizing pancreatitis. He was restarted on meropenem. GI and ID consulted.  Patient underwent EUS, but unable to get adequate window for stent during EUS. IR consulted, he underwent  percutaneous drain placement into pancreatic pseudocyst by IR . Patient will be discharge with percutaneous drain and follow up with IR and Dr Rush Landmark. We are waiting for culture results to determine oral antibiotics for discharge.   Assessment & Plan:   1-Sepsis due to infected pseudocyst complicating necrotizing pancreatitis -GI, infectious disease consulting -completed a three-week course of IV meropenem as outpatient 1 week prior to admission - 9/13 underwent unsuccessful attempt at EUS guided cystogastrostomy -IR consulted, underwent percutaneous drain into the  pancreatic pseudocyst, fluid culture with moderate gram-positive cocci, final cultures pending, reintubated -Clinically improving, currently on IV vancomycin and meropenem -Discharged home on IV antibiotics per infectious disease recommendations -Close follow-up with GI on 10/11, and interventional radiology in 1-2 weeks at the drain clinic  2-Necrotizing pancreatitis;  -Improving, as above  3-HTN;  -continue Norvasc, atenolol dose decreased due to bradycardia  4-Hyperglycemia;  -continue sliding scale insulin  History of nephrectomy for left-sided renal cell carcinoma in 2012.  Hypokalemia; replaced.   Anemia; no melena. Iron deficiency.  -due to chronic disease  DVT prophylaxis: SCD.  Code Status: full code.  Family Communication: care discussed with patient  Disposition Plan: home pending culture data    Consultants:   GI   ID   Procedures:  \none  Antimicrobials: meropenem.   Subjective: -little concerned about his blood pressure this morning, no other complaints, tolerating diet, denies any abdominal pain nausea vomiting    Objective: Vitals:   12/15/17 1000 12/15/17 1606 12/15/17 2307 12/16/17 0739  BP: (!) 139/95 (!) 135/94 (!) 149/91 (!) 140/101  Pulse: 65 60 (!) 56 69  Resp: 18 18 16 12   Temp: 98 F (36.7 C) 98.3 F (36.8 C) 98.4 F (36.9 C) 98.2 F (36.8 C)  TempSrc: Oral Oral Oral Oral  SpO2: 97% 97% 97% 98%  Weight:      Height:        Intake/Output Summary (Last 24 hours) at 12/16/2017 1303 Last data filed at 12/16/2017 0915 Gross per 24 hour  Intake 245 ml  Output 50 ml  Net 195 ml   Filed Weights   12/09/17 0302 12/15/17 0500  Weight: 77.1 kg 75.7 kg  Examination:  Gen: Awake, Alert, Oriented X 3,  HEENT: PERRLA, Neck supple, no JVD Lungs: Good air movement bilaterally, CTAB CVS: RRR,No Gallops,Rubs or new Murmurs Abd: soft, Non tender, non distended, BS present Extremities: No Cyanosis, Clubbing or edema Skin: no new  rashes   Data Reviewed: I have personally reviewed following labs and imaging studies  CBC: Recent Labs  Lab 12/12/17 0850 12/13/17 0833 12/14/17 0832 12/15/17 0753 12/16/17 0315  WBC 5.6 5.4 6.7 8.0 7.1  HGB 10.8* 10.8* 11.4* 10.7* 11.3*  HCT 33.1* 33.3* 34.9* 33.0* 35.2*  MCV 85.1 85.2 84.5 84.8 84.8  PLT 256 243 303 334 694   Basic Metabolic Panel: Recent Labs  Lab 12/11/17 0255 12/12/17 0850 12/13/17 0833 12/14/17 0832 12/16/17 0315  NA 137 135 139 137 137  K 3.7 4.1 3.5 3.9 3.6  CL 107 102 103 103 102  CO2 23 22 23 25 23   GLUCOSE 121* 138* 146* 150* 148*  BUN <5* 5* <5* 5* 6  CREATININE 1.00 1.03 0.90 0.97 0.89  CALCIUM 8.5* 8.6* 8.8* 9.0 9.0   GFR: Estimated Creatinine Clearance: 118.5 mL/min (by C-G formula based on SCr of 0.89 mg/dL). Liver Function Tests: Recent Labs  Lab 12/10/17 0618 12/13/17 0833 12/14/17 0832  AST 26 28 27   ALT 31 38 37  ALKPHOS 88 99 103  BILITOT 1.3* 0.7 0.7  PROT 5.6* 6.3* 6.9  ALBUMIN 2.4* 2.8* 3.0*   No results for input(s): LIPASE, AMYLASE in the last 168 hours. No results for input(s): AMMONIA in the last 168 hours. Coagulation Profile: No results for input(s): INR, PROTIME in the last 168 hours. Cardiac Enzymes: No results for input(s): CKTOTAL, CKMB, CKMBINDEX, TROPONINI in the last 168 hours. BNP (last 3 results) No results for input(s): PROBNP in the last 8760 hours. HbA1C: No results for input(s): HGBA1C in the last 72 hours. CBG: Recent Labs  Lab 12/15/17 1147 12/15/17 1713 12/15/17 2121 12/16/17 0741 12/16/17 1149  GLUCAP 191* 157* 168* 146* 166*   Lipid Profile: No results for input(s): CHOL, HDL, LDLCALC, TRIG, CHOLHDL, LDLDIRECT in the last 72 hours. Thyroid Function Tests: No results for input(s): TSH, T4TOTAL, FREET4, T3FREE, THYROIDAB in the last 72 hours. Anemia Panel: No results for input(s): VITAMINB12, FOLATE, FERRITIN, TIBC, IRON, RETICCTPCT in the last 72 hours. Sepsis Labs: No  results for input(s): PROCALCITON, LATICACIDVEN in the last 168 hours.  Recent Results (from the past 240 hour(s))  Culture, blood (routine x 2)     Status: None   Collection Time: 12/08/17 11:59 PM  Result Value Ref Range Status   Specimen Description BLOOD LEFT ANTECUBITAL  Final   Special Requests   Final    BOTTLES DRAWN AEROBIC AND ANAEROBIC Blood Culture adequate volume   Culture   Final    NO GROWTH 5 DAYS Performed at [facility], [address], [place], [place] [address]    Report Status 12/14/2017 FINAL  Final  Culture, blood (routine x 2)     Status: None   Collection Time: 12/09/17 12:00 AM  Result Value Ref Range Status   Specimen Description BLOOD RIGHT ANTECUBITAL  Final   Special Requests   Final    BOTTLES DRAWN AEROBIC AND ANAEROBIC Blood Culture results may not be optimal due to an inadequate volume of blood received in culture bottles   Culture   Final    NO GROWTH 5 DAYS Performed at [facility], [place] [address], [place], [place] [address]  Report Status 12/14/2017 FINAL  Final  MRSA PCR Screening     Status: None   Collection Time: 12/09/17  5:45 AM  Result Value Ref Range Status   MRSA by PCR NEGATIVE NEGATIVE Final    Comment:        The GeneXpert MRSA Assay (FDA approved for NASAL specimens only), is one component of a comprehensive MRSA colonization surveillance program. It is not intended to diagnose MRSA infection nor to guide or monitor treatment for MRSA infections. Performed at [facility], [place] [address], [place], [place] [address]   Aerobic/Anaerobic Culture (surgical/deep wound)     Status: None (Preliminary result)   Collection Time: 12/13/17  1:13 PM  Result Value Ref Range Status   Specimen Description WOUND  Final   Special Requests Normal  Final   Gram Stain   Final    ABUNDANT WBC PRESENT, PREDOMINANTLY PMN MODERATE GRAM POSITIVE COCCI Performed at [facility], [place] [address], [place], [place]  [address]    Culture RARE LEUCONOSTOC PSEUDOMESENTEROIDES  Final   Report Status PENDING  Incomplete         Radiology Studies: No results found.      Scheduled Meds: . allopurinol  100 mg Oral BID  . amLODipine  2.5 mg Oral Daily  . atenolol  25 mg Oral Daily  . feeding supplement (ENSURE ENLIVE)  237 mL Oral BID BM  . insulin aspart  0-9 Units Subcutaneous TID WC  . pantoprazole  40 mg Oral Q0600  . sodium chloride flush  5 mL Intracatheter Q8H  . vitamin B-12  1,000 mcg Oral Daily   Continuous Infusions: . ampicillin-sulbactam (UNASYN) IV 3 g (12/16/17 0923)     LOS: 8 days    Time spent: 25 minutes.     Domenic Polite, MD [facility] Page via [email or web address]   If 7PM-7AM, please contact night-coverage [email or web address] Password [email or web address] 12/16/2017, 1:03 PM

## 2017-12-16 NOTE — Plan of Care (Signed)
Discussed plan of care with patient.  Emphasized pain management and using the call light when assistance is needed.  Minimal teach back displayed.

## 2017-12-16 NOTE — Progress Notes (Signed)
INFECTIOUS DISEASE PROGRESS NOTE  ID: Paul [name] is a 37 y.o. male with  Principal Problem:   Sepsis ([place]) Active Problems:   Essential hypertension   Necrotizing pancreatitis   Hyperglycemia   Pancreatic abscess   Pancreatic necrosis  Subjective: Denies pain, eating well, normal BM.   Abtx:  Anti-infectives (From admission, onward)   Start     Dose/Rate Route Frequency Ordered Stop   12/12/17 1500  Ampicillin-Sulbactam (UNASYN) 3 g in sodium chloride 0.9 % 100 mL IVPB     3 g 200 mL/hr over 30 Minutes Intravenous Every 6 hours 12/12/17 1135     12/09/17 1200  vancomycin (VANCOCIN) IVPB 750 mg/150 ml premix  Status:  Discontinued     750 mg 150 mL/hr over 60 Minutes Intravenous Every 8 hours 12/09/17 0401 12/09/17 1142   12/09/17 0600  meropenem (MERREM) 1 g in sodium chloride 0.9 % 100 mL IVPB  Status:  Discontinued     1 g 200 mL/hr over 30 Minutes Intravenous Every 8 hours 12/09/17 0254 12/12/17 1020   12/09/17 0415  vancomycin (VANCOCIN) 1,500 mg in sodium chloride 0.9 % 500 mL IVPB     1,500 mg 250 mL/hr over 120 Minutes Intravenous  Once 12/09/17 0401 12/09/17 1905   12/08/17 2330  piperacillin-tazobactam (ZOSYN) IVPB 3.375 g     3.375 g 100 mL/hr over 30 Minutes Intravenous  Once 12/08/17 2323 12/09/17 0038      Medications:  Scheduled: . allopurinol  100 mg Oral BID  . amLODipine  2.5 mg Oral Daily  . atenolol  25 mg Oral Daily  . feeding supplement (ENSURE ENLIVE)  237 mL Oral BID BM  . insulin aspart  0-9 Units Subcutaneous TID WC  . pantoprazole  40 mg Oral Q0600  . sodium chloride flush  5 mL Intracatheter Q8H  . vitamin B-12  1,000 mcg Oral Daily    Objective: Vital signs in last 24 hours: Temp:  [98 F (36.7 C)-98.4 F (36.9 C)] 98.2 F (36.8 C) (09/18 0739) Pulse Rate:  [56-69] 69 (09/18 0739) Resp:  [12-18] 12 (09/18 0739) BP: (135-149)/(91-101) 140/101 (09/18 0739) SpO2:  [97 %-98 %] 98 % (09/18 0739)   General appearance: alert,  cooperative and no distress Resp: clear to auscultation bilaterally Cardio: regular rate and rhythm GI: normal findings: bowel sounds normal and soft, non-tender and abnormal findings:  mild distension  Lab Results Recent Labs    12/14/17 0832 12/15/17 0753 12/16/17 0315  WBC 6.7 8.0 7.1  HGB 11.4* 10.7* 11.3*  HCT 34.9* 33.0* 35.2*  NA 137  --  137  K 3.9  --  3.6  CL 103  --  102  CO2 25  --  23  BUN 5*  --  6  CREATININE 0.97  --  0.89   Liver Panel Recent Labs    12/14/17 0832  PROT 6.9  ALBUMIN 3.0*  AST 27  ALT 37  ALKPHOS 103  BILITOT 0.7   Sedimentation Rate No results for input(s): ESRSEDRATE in the last 72 hours. C-Reactive Protein No results for input(s): CRP in the last 72 hours.  Microbiology: Recent Results (from the past 240 hour(s))  Culture, blood (routine x 2)     Status: None   Collection Time: 12/08/17 11:59 PM  Result Value Ref Range Status   Specimen Description BLOOD LEFT ANTECUBITAL  Final   Special Requests   Final    BOTTLES DRAWN AEROBIC AND ANAEROBIC Blood Culture adequate volume  Culture   Final    NO GROWTH 5 DAYS Performed at [facility], [place] [address], [place], [place] [address]    Report Status 12/14/2017 FINAL  Final  Culture, blood (routine x 2)     Status: None   Collection Time: 12/09/17 12:00 AM  Result Value Ref Range Status   Specimen Description BLOOD RIGHT ANTECUBITAL  Final   Special Requests   Final    BOTTLES DRAWN AEROBIC AND ANAEROBIC Blood Culture results may not be optimal due to an inadequate volume of blood received in culture bottles   Culture   Final    NO GROWTH 5 DAYS Performed at [facility], [place] [address], [place], [place] [address]    Report Status 12/14/2017 FINAL  Final  MRSA PCR Screening     Status: None   Collection Time: 12/09/17  5:45 AM  Result Value Ref Range Status   MRSA by PCR NEGATIVE NEGATIVE Final    Comment:        The GeneXpert MRSA Assay (FDA approved for  NASAL specimens only), is one component of a comprehensive MRSA colonization surveillance program. It is not intended to diagnose MRSA infection nor to guide or monitor treatment for MRSA infections. Performed at [facility], [place] [address], [place], [place] [address]   Aerobic/Anaerobic Culture (surgical/deep wound)     Status: None (Preliminary result)   Collection Time: 12/13/17  1:13 PM  Result Value Ref Range Status   Specimen Description WOUND  Final   Special Requests Normal  Final   Gram Stain   Final    ABUNDANT WBC PRESENT, PREDOMINANTLY PMN MODERATE GRAM POSITIVE COCCI    Culture   Final    CULTURE REINCUBATED FOR BETTER GROWTH Performed at [facility], [place] [address], [place], [place] [address]    Report Status PENDING  Incomplete    Studies/Results: No results found.   Assessment/Plan: Pancreatic pseudocyst  Total days of antibiotics:8 (unasyn day 4)  Await Cx data Continue unasyn, possibly home with augmentin (vs continued IV)          Bobby Rumpf MD, FACP Infectious Diseases (pager) [phone] [email or web address] 12/16/2017, 9:34 AM  LOS: 8 days

## 2017-12-16 NOTE — Care Management Note (Addendum)
Case Management Note  Patient Details  Name: Santhiago Collingsworth MRN: [id number] Date of Birth: 12-Apr-1980  Subjective/Objective:  From home with spouse, necrotizing pancreatitis, sepsis, awaiting cx results in order to determine abx.  May need long term iv abx at discharge, patient has had [facility] in the past and would like [facility] again if need for iv abx. NCM notified Pam with [facility] of this information.    9/19 Tomi Bamberger RN, BSN- patient for dc today, will be on po abx.  He knows how to do his own drain care per [facility].                  Action/Plan: NCM will follow for transition of care needs.  Expected Discharge Date:                  Expected Discharge Plan:  [place]  In-House Referral:     Discharge planning Services  CM Consult  Post Acute Care Choice:    Choice offered to:     DME Arranged:    DME Agency:     HH Arranged:    [place] Agency:     Status of Service:  In process, will continue to follow  If discussed at Long Length of Stay Meetings, dates discussed:    Additional Comments:  Zenon Mayo, RN 12/16/2017, 8:59 AM

## 2017-12-16 NOTE — Progress Notes (Signed)
Awaiting final bug ID from clx of cyst fluid.   Day 8 abx, day 4 unasyn.   Cystostomy tube output 45 ml yesterday.  Flushing with 27mL q 8 hours.    Follow ID lead on all abx decisions.    Pt has ROV with Dr Rush Landmark on 10/11.  Will see IR and likely undergo repeat CT imaging within 1 to 2 weeks of discharge.    Azucena Freed PA-C.

## 2017-12-16 NOTE — H&P (View-Only) (Signed)
Awaiting final bug ID from clx of cyst fluid.   Day 8 abx, day 4 unasyn.   Cystostomy tube output 45 ml yesterday.  Flushing with 33mL q 8 hours.    Follow ID lead on all abx decisions.    Pt has ROV with Dr Rush Landmark on 10/11.  Will see IR and likely undergo repeat CT imaging within 1 to 2 weeks of discharge.    Azucena Freed PA-C.

## 2017-12-17 DIAGNOSIS — R739 Hyperglycemia, unspecified: Secondary | ICD-10-CM

## 2017-12-17 DIAGNOSIS — B9689 Other specified bacterial agents as the cause of diseases classified elsewhere: Secondary | ICD-10-CM

## 2017-12-17 MED ORDER — LEVEMIR FLEXTOUCH 100 UNIT/ML ~~LOC~~ SOPN: 8.0000 [IU] | PEN_INJECTOR | Freq: Every day | SUBCUTANEOUS | Status: AC

## 2017-12-17 MED ORDER — AMOXICILLIN-POT CLAVULANATE 875-125 MG PO TABS
1.0000 | ORAL_TABLET | Freq: Two times a day (BID) | ORAL | Status: DC
  Administered 2017-12-17: 1 via ORAL
  Filled 2017-12-17: qty 1

## 2017-12-17 MED ORDER — ATENOLOL 50 MG PO TABS: 25.0000 mg | ORAL_TABLET | Freq: Every day | ORAL | Status: DC

## 2017-12-17 MED ORDER — AMOXICILLIN-POT CLAVULANATE 875-125 MG PO TABS: 1.0000 | ORAL_TABLET | Freq: Two times a day (BID) | ORAL | 0 refills | Status: DC

## 2017-12-17 MED FILL — AMOX-CLAV 875-125 MG TABLET: 875-125 | 28 days supply | Qty: 28 | Fill #0

## 2017-12-17 NOTE — Progress Notes (Signed)
Referring Physician(s): Dr Fanny Bien  Supervising Physician: Aletta Edouard  Patient Status:  [facility] - In-pt  Chief Complaint:  Pancreatic abscess drain placed in IR 9/15  Subjective:  Better daily 'OP is dark color 45 cc or so daily Up in bed Eating well  Allergies: Honey  Medications: Prior to Admission medications   Medication Sig Start Date End Date Taking? Authorizing Provider  allopurinol (ZYLOPRIM) 100 MG tablet Take 100 mg by mouth 2 (two) times daily. 10/07/17  Yes [provider]  amLODipine (NORVASC) 2.5 MG tablet Take 2.5 mg by mouth at bedtime.  10/07/17  Yes [provider]  atenolol (TENORMIN) 50 MG tablet Take 50 mg by mouth daily. 10/27/17  Yes [provider]  ibuprofen (ADVIL,MOTRIN) 200 MG tablet Take 200 mg by mouth every 6 (six) hours as needed for fever.   Yes [provider]  insulin aspart (NOVOLOG) 100 UNIT/ML FlexPen Before each meal 3 times a day, 140-199 - 2 units, 200-250 - 4 units, 251-299 - 6 units,  300-349 - 8 units,  350 or above 10 units. Insulin PEN if approved, provide syringes and needles if needed. Patient taking differently: Inject 0-10 Units into the skin 3 (three) times daily before meals. Per sliding scale: 140-199 - 2 units, 200-250 - 4 units, 251-299 - 6 units,  300-349 - 8 units,  350 or above 10 units. 11/18/17  Yes Florencia Reasons, MD  insulin glargine (LANTUS) 100 unit/mL SOPN Inject 0.15 mLs (15 Units total) into the skin at bedtime. Patient taking differently: Inject 15 Units into the skin at bedtime. Do not take if bs is 80 or lower 11/18/17  Yes Florencia Reasons, MD  vitamin B-12 (CYANOCOBALAMIN) 1000 MCG tablet Take 1,000 mcg by mouth daily.   Yes [provider]  Amino Acids-Protein Hydrolys (FEEDING SUPPLEMENT, PRO-STAT SUGAR FREE 64,) LIQD Take 30 mLs by mouth 3 (three) times daily between meals. Patient not taking: Reported on 12/08/2017 11/18/17   Florencia Reasons, MD  blood glucose meter kit and  supplies Dispense based on patient and insurance preference. Use up to four times daily as directed. (FOR ICD-10 E10.9, E11.9). 11/18/17   Florencia Reasons, MD  Insulin Pen Needle 31G X 5 MM MISC For insulin injection, please provide a month supply. 11/18/17   Florencia Reasons, MD  LEVEMIR FLEXTOUCH 100 UNIT/ML Pen Inject 15 Units into the skin at bedtime. 11/27/17   [provider]     Vital Signs: BP (!) 136/95   Pulse 64   Temp 97.9 F (36.6 C) (Oral)   Resp 14   Ht '5\' 10"'$  (1.778 m)   Wt 166 lb 14.2 oz (75.7 kg)   SpO2 98%   BMI 23.95 kg/m   Physical Exam  Constitutional: He is oriented to person, place, and time.  Abdominal: Soft. Bowel sounds are normal. There is no tenderness.  Musculoskeletal: Normal range of motion.  Neurological: He is alert and oriented to person, place, and time.  Skin: Skin is warm and dry.  Site of drain is clean and dry NT No bleeding OP 45 cc yesterday 10 cc in JP RARE LEUCONOSTOC PSEUDOMESENTEROIDES  Vitals reviewed.   Imaging: Ct Image Guided Drainage By Percutaneous Catheter  Result Date: 12/13/2017 INDICATION: History of pancreatic pseudocyst post failed attempted cyst-gastrostomy tube placement. Patient has been evaluated by the surgical service and request has been made for placement of a percutaneous drainage catheter for infection source control purposes. EXAM: CT IMAGE GUIDED DRAINAGE  BY PERCUTANEOUS CATHETER COMPARISON:  CT abdomen and pelvis - 12/08/2017; 11/16/2017; 11/04/2017 MEDICATIONS: The patient is currently admitted to the hospital and receiving intravenous antibiotics. The antibiotics were administered within an appropriate time frame prior to the initiation of the procedure. ANESTHESIA/SEDATION: Moderate (conscious) sedation was employed during this procedure. A total of Versed 4 mg and Fentanyl 150 mcg was administered intravenously. Moderate Sedation Time: 22 minutes. The patient's level of consciousness and vital signs were monitored  continuously by radiology nursing throughout the procedure under my direct supervision. CONTRAST:  None COMPLICATIONS: None immediate. PROCEDURE: Informed written consent was obtained from the patient after a discussion of the risks, benefits and alternatives to treatment. The patient was placed supine, slightly RPO on the CT gantry and a pre procedural CT was performed re-demonstrating the known abscess/fluid collection within the pancreatic bed with dominant air and fluid containing pancreatic pseudocyst appearing slightly smaller since undergoing attempted cyst gastrostomy placement but with residual component measuring at least 10.9 x 4.0 cm (26, series 3). The procedure was planned. A timeout was performed prior to the initiation of the procedure. The skin overlying the left lateral abdomen was prepped and draped in the usual sterile fashion. The overlying soft tissues were anesthetized with 1% lidocaine with epinephrine. Appropriate trajectory was planned with the use of a 22 gauge spinal needle. An 18 gauge trocar needle was advanced into the abscess/fluid collection however the wire was noted to be coiled within the likely loculated lateral component of the collection. As such, the trocar needle was exchanged for a 15 cm Yueh sheath catheter which was utilized to manipulate the Amplatz wire to the medial aspect of the pancreatic pseudocyst. Appropriate position was confirmed with CT imaging. The tract was serially dilated allowing placement of a 10 [place] all-purpose multi side-hole biliary drainage catheter. Appropriate positioning was confirmed with a limited postprocedural CT scan. 25 ml of brown-colored purulent appearing fluid was aspirated. The tube was connected to a JP bulb and sutured in place. A dressing was placed. The patient tolerated the procedure well without immediate post procedural complication. IMPRESSION: Successful CT guided placement of a 10 [place] all purpose multi side-hole biliary  drain catheter into the pancreatic pseudocyst with aspiration of 25 cc of brown colored, purulent appearing fluid. Samples were sent to the laboratory as requested by the ordering clinical team. Electronically Signed   By: Sandi Mariscal M.D.   On: 12/13/2017 16:18    Labs:  CBC: Recent Labs    12/13/17 0833 12/14/17 0832 12/15/17 0753 12/16/17 0315  WBC 5.4 6.7 8.0 7.1  HGB 10.8* 11.4* 10.7* 11.3*  HCT 33.3* 34.9* 33.0* 35.2*  PLT 243 303 334 355    COAGS: Recent Labs    11/13/17 0414 11/17/17 0803 11/24/17 1253 12/09/17 0313  INR 1.07 1.07 1.2* 1.37  APTT  --   --   --  35    BMP: Recent Labs    12/12/17 0850 12/13/17 0833 12/14/17 0832 12/16/17 0315  NA 135 139 137 137  K 4.1 3.5 3.9 3.6  CL 102 103 103 102  CO2 '22 23 25 23  '$ GLUCOSE 138* 146* 150* 148*  BUN 5* <5* 5* 6  CALCIUM 8.6* 8.8* 9.0 9.0  CREATININE 1.03 0.90 0.97 0.89  GFRNONAA >60 >60 >60 >60  GFRAA >60 >60 >60 >60    LIVER FUNCTION TESTS: Recent Labs    12/09/17 0313 12/10/17 0618 12/13/17 0833 12/14/17 0832  BILITOT 2.0* 1.3* 0.7 0.7  AST 34  $'26 28 27  'i$ ALT 38 31 38 37  ALKPHOS 97 88 99 103  PROT 6.1* 5.6* 6.3* 6.9  ALBUMIN 2.9* 2.4* 2.8* 3.0*    Assessment and Plan:  Pancreatic abscess Drain placed 9/15 Pt will hear from [facility] for drain check Orders in place Need to flush at home 10 cc sterile saline daily-- record OP Pleas give Rx for flushes upon DC  Electronically Signed: Lakara Weiland A, PA-C 12/17/2017, 7:58 AM   I spent a total of 15 Minutes at the the patient's bedside AND on the patient's hospital floor or unit, greater than 50% of which was counseling/coordinating care for pancreatic abscess

## 2017-12-17 NOTE — Progress Notes (Addendum)
INFECTIOUS DISEASE PROGRESS NOTE  ID: Paul [name] is a 37 y.o. male with  Principal Problem:   Sepsis ([place]) Active Problems:   Essential hypertension   Necrotizing pancreatitis   Hyperglycemia   Pancreatic abscess   Pancreatic necrosis  Subjective: No complaints.   Abtx:  Anti-infectives (From admission, onward)   Start     Dose/Rate Route Frequency Ordered Stop   12/12/17 1500  Ampicillin-Sulbactam (UNASYN) 3 g in sodium chloride 0.9 % 100 mL IVPB     3 g 200 mL/hr over 30 Minutes Intravenous Every 6 hours 12/12/17 1135     12/09/17 1200  vancomycin (VANCOCIN) IVPB 750 mg/150 ml premix  Status:  Discontinued     750 mg 150 mL/hr over 60 Minutes Intravenous Every 8 hours 12/09/17 0401 12/09/17 1142   12/09/17 0600  meropenem (MERREM) 1 g in sodium chloride 0.9 % 100 mL IVPB  Status:  Discontinued     1 g 200 mL/hr over 30 Minutes Intravenous Every 8 hours 12/09/17 0254 12/12/17 1020   12/09/17 0415  vancomycin (VANCOCIN) 1,500 mg in sodium chloride 0.9 % 500 mL IVPB     1,500 mg 250 mL/hr over 120 Minutes Intravenous  Once 12/09/17 0401 12/09/17 1905   12/08/17 2330  piperacillin-tazobactam (ZOSYN) IVPB 3.375 g     3.375 g 100 mL/hr over 30 Minutes Intravenous  Once 12/08/17 2323 12/09/17 0038      Medications:  Scheduled: . allopurinol  100 mg Oral BID  . amLODipine  2.5 mg Oral Daily  . atenolol  25 mg Oral Daily  . feeding supplement (ENSURE ENLIVE)  237 mL Oral BID BM  . insulin aspart  0-9 Units Subcutaneous TID WC  . pantoprazole  40 mg Oral Q0600  . sodium chloride flush  5 mL Intracatheter Q8H  . vitamin B-12  1,000 mcg Oral Daily    Objective: Vital signs in last 24 hours: Temp:  [97.9 F (36.6 C)-98.2 F (36.8 C)] 97.9 F (36.6 C) (09/19 0726) Pulse Rate:  [59-71] 64 (09/19 0726) Resp:  [12-16] 14 (09/19 0726) BP: (126-136)/(87-95) 136/95 (09/19 0726) SpO2:  [98 %] 98 % (09/19 0726)   General appearance: alert, cooperative and no distress Resp:  clear to auscultation bilaterally Cardio: regular rate and rhythm GI: normal findings: bowel sounds normal and soft, non-tender  Lab Results Recent Labs    12/15/17 0753 12/16/17 0315  WBC 8.0 7.1  HGB 10.7* 11.3*  HCT 33.0* 35.2*  NA  --  137  K  --  3.6  CL  --  102  CO2  --  23  BUN  --  6  CREATININE  --  0.89   Liver Panel No results for input(s): PROT, ALBUMIN, AST, ALT, ALKPHOS, BILITOT, BILIDIR, IBILI in the last 72 hours. Sedimentation Rate No results for input(s): ESRSEDRATE in the last 72 hours. C-Reactive Protein No results for input(s): CRP in the last 72 hours.  Microbiology: Recent Results (from the past 240 hour(s))  Culture, blood (routine x 2)     Status: None   Collection Time: 12/08/17 11:59 PM  Result Value Ref Range Status   Specimen Description BLOOD LEFT ANTECUBITAL  Final   Special Requests   Final    BOTTLES DRAWN AEROBIC AND ANAEROBIC Blood Culture adequate volume   Culture   Final    NO GROWTH 5 DAYS Performed at [facility], [address], [place], [place] [address]    Report Status 12/14/2017  FINAL  Final  Culture, blood (routine x 2)     Status: None   Collection Time: 12/09/17 12:00 AM  Result Value Ref Range Status   Specimen Description BLOOD RIGHT ANTECUBITAL  Final   Special Requests   Final    BOTTLES DRAWN AEROBIC AND ANAEROBIC Blood Culture results may not be optimal due to an inadequate volume of blood received in culture bottles   Culture   Final    NO GROWTH 5 DAYS Performed at [facility], [place] [address] [place], [place] [address]    Report Status 12/14/2017 FINAL  Final  MRSA PCR Screening     Status: None   Collection Time: 12/09/17  5:45 AM  Result Value Ref Range Status   MRSA by PCR NEGATIVE NEGATIVE Final    Comment:        The GeneXpert MRSA Assay (FDA approved for NASAL specimens only), is one component of a comprehensive MRSA colonization surveillance program. It is not intended to diagnose  MRSA infection nor to guide or monitor treatment for MRSA infections. Performed at [facility], [place] [address], [place], [place] [address]   Aerobic/Anaerobic Culture (surgical/deep wound)     Status: None (Preliminary result)   Collection Time: 12/13/17  1:13 PM  Result Value Ref Range Status   Specimen Description WOUND  Final   Special Requests Normal  Final   Gram Stain   Final    ABUNDANT WBC PRESENT, PREDOMINANTLY PMN MODERATE GRAM POSITIVE COCCI Performed at [facility], [place] [address] [place], [place] [address]    Culture RARE LEUCONOSTOC PSEUDOMESENTEROIDES  Final   Report Status PENDING  Incomplete    Studies/Results: No results found.   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 It is also usually sensitive to imipenem.  Spoke with lab- they will set up amoxil, amox-clav sensi. Result out in 1 week, being sent to [facility], [place] Case Reports in [facility]e 2016 [phone])) MICs for susceptibility as follows Amp 2 Pen 1 He can be d/c on augmentin in the mean time.  D/i Dr Aileen Pilot MD, FACP Infectious Diseases (pager) [phone] [email or web address] 12/17/2017, 10:20 AM  LOS: 9 days

## 2017-12-18 ENCOUNTER — Telehealth: Payer: Self-pay | Admitting: Infectious Diseases

## 2017-12-18 ENCOUNTER — Other Ambulatory Visit: Payer: Self-pay | Admitting: Gastroenterology

## 2017-12-18 DIAGNOSIS — K859 Acute pancreatitis without necrosis or infection, unspecified: Secondary | ICD-10-CM

## 2017-12-18 LAB — GLUCOSE, CAPILLARY
GLUCOSE-CAPILLARY: 157 mg/dL — AB (ref 70–99)
GLUCOSE-CAPILLARY: 168 mg/dL — AB (ref 70–99)
Glucose-Capillary: 192 mg/dL — ABNORMAL HIGH (ref 70–99)

## 2017-12-18 MED ORDER — FLUCONAZOLE 100 MG PO TABS: 100.0000 mg | ORAL_TABLET | Freq: Every day | ORAL | 0 refills | Status: DC

## 2017-12-18 NOTE — Telephone Encounter (Signed)
Spoke with lab about his Cx. He is also growing a yeast. Will send in diflucan rx.  Called and left VM

## 2017-12-21 ENCOUNTER — Other Ambulatory Visit: Payer: Self-pay | Admitting: Behavioral Health

## 2017-12-21 ENCOUNTER — Other Ambulatory Visit: Payer: Self-pay

## 2017-12-21 DIAGNOSIS — K859 Acute pancreatitis without necrosis or infection, unspecified: Secondary | ICD-10-CM

## 2017-12-21 LAB — SUSCEPTIBILITY, AER + ANAEROB

## 2017-12-21 LAB — SUSCEPTIBILITY RESULT

## 2017-12-21 MED ORDER — FLUCONAZOLE 100 MG PO TABS: 100.0000 mg | ORAL_TABLET | Freq: Every day | ORAL | 0 refills | Status: AC

## 2017-12-23 ENCOUNTER — Ambulatory Visit
Admission: RE | Admit: 2017-12-23 | Discharge: 2017-12-23 | Disposition: A | Discharge status: Home / Self Care | Payer: 59 | Source: Ambulatory Visit | Attending: Gastroenterology | Admitting: Gastroenterology

## 2017-12-23 ENCOUNTER — Ambulatory Visit
Admission: RE | Admit: 2017-12-23 | Discharge: 2017-12-23 | Disposition: A | Discharge status: Home / Self Care | Payer: 59 | Source: Ambulatory Visit | Attending: Radiology | Admitting: Radiology

## 2017-12-23 ENCOUNTER — Encounter: Payer: Self-pay | Admitting: Radiology

## 2017-12-23 DIAGNOSIS — K859 Acute pancreatitis without necrosis or infection, unspecified: Secondary | ICD-10-CM

## 2017-12-23 HISTORY — PX: IR RADIOLOGIST EVAL & MGMT: IMG5224

## 2017-12-23 LAB — MISC LABCORP TEST (SEND OUT): Labcorp test code: 182220

## 2017-12-23 MED ORDER — IOPAMIDOL (ISOVUE-300) INJECTION 61%
100.0000 mL | Freq: Once | INTRAVENOUS | Status: AC | PRN
  Administered 2017-12-23: 100 mL via INTRAVENOUS

## 2017-12-23 NOTE — Progress Notes (Signed)
Chief Complaint: Patient was seen in consultation today for  Chief Complaint  Patient presents with  . Follow-up    Drain follow up     at the request of Turpin,Pamela  Referring Physician(s): Dr. Rush Landmark ([facility])  History of Present Illness: Paul [name] is a 37 y.o. 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  Patient developed persistent recurrent fevers every time antibiotic therapy was discontinued, therefore he underwent an attempt at percutaneous cyst gastrostomy creation.  Unfortunately, the distance between the pseudocyst in the gastric wall was too great and the procedure could not be completed.  Ultimately, as a last resort, a percutaneous drainage catheter was placed on 12/13/2017.  Patient presents today for follow-up imaging and clinical evaluation.  While the size of the pseudocyst has significantly decreased compared to the prior imaging, there is still a relatively large persistent fluid and gas collection in the pancreatic bed along with apparent fistulization with the descending duodenum.  Clinically, this gentleman is asymptomatic.  He denies abdominal pain, nausea, vomiting, diarrhea, hematemesis, fever, chills or other clinical symptoms.  He was having approximately 20 mL of fluid output daily until this past weekend.  Now, he has essentially no drain output for the past 5 days.  His wife is with him.    Past Medical History:  Diagnosis Date  . Cancer of kidney [facility]) 2011   "left"  . Elevated blood uric acid level    "I take RX for it; Allopurinol" (11/04/2017)  . GERD (gastroesophageal reflux disease)   . High cholesterol   . Hypertension   . Migraine    "only in my teens" (11/04/2017)  . Pancreatitis     Past Surgical History:  Procedure Laterality Date  . BIOPSY  12/11/2017   Procedure: BIOPSY;  Surgeon:  Rush Landmark Telford Nab., MD;  Location: [place];  Service: Gastroenterology;;  . ESOPHAGOGASTRODUODENOSCOPY (EGD) WITH PROPOFOL N/A 12/11/2017   Procedure: ESOPHAGOGASTRODUODENOSCOPY (EGD) WITH PROPOFOL;  Surgeon: Irving Copas., MD;  Location: [place];  Service: Gastroenterology;  Laterality: N/A;  . IR RADIOLOGIST EVAL & MGMT  12/23/2017  . NEPHRECTOMY Left 2011   "kidney cancer"  . UPPER ESOPHAGEAL ENDOSCOPIC ULTRASOUND (EUS) N/A 12/11/2017   Procedure: UPPER ESOPHAGEAL ENDOSCOPIC ULTRASOUND (EUS);  Surgeon: Irving Copas., MD;  Location: [place];  Service: Gastroenterology;  Laterality: N/A;  . UPPER GASTROINTESTINAL ENDOSCOPY  02/2017    Allergies: Honey and Other  Medications: Prior to Admission medications   Medication Sig Start Date End Date Taking? Authorizing Provider  allopurinol (ZYLOPRIM) 100 MG tablet Take 100 mg by mouth 2 (two) times daily. 10/07/17   [provider]  amLODipine (NORVASC) 2.5 MG tablet Take 2.5 mg by mouth at bedtime.  10/07/17   [provider]  amoxicillin-clavulanate (AUGMENTIN) 875-125 MG tablet Take 1 tablet by mouth 2 (two) times daily for 14 days. 12/17/17 12/31/17  Domenic Polite, MD  atenolol (TENORMIN) 50 MG tablet Take 0.5 tablets (25 mg total) by mouth daily. 12/17/17   Domenic Polite, MD  blood glucose meter kit and supplies Dispense based on patient and insurance preference. Use up to four times daily as directed. (FOR ICD-10 E10.9, E11.9). 11/18/17   Florencia Reasons, MD  fluconazole (DIFLUCAN) 100 MG tablet Take 1 tablet (100 mg total) by mouth daily. 12/21/17 01/20/18  Campbell Riches, MD  ibuprofen (ADVIL,MOTRIN) 200 MG tablet  Take 200 mg by mouth every 6 (six) hours as needed for fever.    [provider]  insulin aspart (NOVOLOG) 100 UNIT/ML FlexPen Before each meal 3 times a day, 140-199 - 2 units, 200-250 - 4 units, 251-299 - 6 units,  300-349 - 8 units,  350 or above 10 units. Insulin PEN  if approved, provide syringes and needles if needed. Patient taking differently: Inject 0-10 Units into the skin 3 (three) times daily before meals. Per sliding scale: 140-199 - 2 units, 200-250 - 4 units, 251-299 - 6 units,  300-349 - 8 units,  350 or above 10 units. 11/18/17   Florencia Reasons, MD  Insulin Pen Needle 31G X 5 MM MISC For insulin injection, please provide a month supply. 11/18/17   Florencia Reasons, MD  LEVEMIR FLEXTOUCH 100 UNIT/ML Pen Inject 8 Units into the skin at bedtime. 12/17/17   Domenic Polite, MD  vitamin B-12 (CYANOCOBALAMIN) 1000 MCG tablet Take 1,000 mcg by mouth daily.    [provider]     Family History  Problem Relation Age of Onset  . Hypertension Mother   . Hypertension Father   . Pancreatitis Paternal Uncle        twice, was a drinker    Social History   Socioeconomic History  . Marital status: Married    Spouse name: Not on file  . Number of children: Not on file  . Years of education: Not on file  . Highest education level: Not on file  Occupational History  . Not on file  Social Needs  . Financial resource strain: Not on file  . Food insecurity:    Worry: Not on file    Inability: Not on file  . Transportation needs:    Medical: Not on file    Non-medical: Not on file  Tobacco Use  . Smoking status: Former Smoker    Packs/day: 0.50    Years: 11.00    Pack years: 5.50    Types: Cigarettes    Last attempt to quit: 2011    Years since quitting: 8.7  . Smokeless tobacco: Never Used  Substance and Sexual Activity  . Alcohol use: Yes    Alcohol/week: 4.0 standard drinks    Types: 4 Cans of beer per week  . Drug use: Never  . Sexual activity: Yes  Lifestyle  . Physical activity:    Days per week: Not on file    Minutes per session: Not on file  . Stress: Not on file  Relationships  . Social connections:    Talks on phone: Not on file    Gets together: Not on file    Attends religious service: Not on file    Active member of club or  organization: Not on file    Attends meetings of clubs or organizations: Not on file    Relationship status: Not on file  Other Topics Concern  . Not on file  Social History Narrative   Married, 1 child, wife expecting   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[employer]   Former smoker   4 beers/week   No drugs    Review of Systems: A 12 point ROS discussed and pertinent positives are indicated in the HPI above.  All other systems are negative.  Review of Systems  Vital Signs: BP (!) 139/91   Pulse 80   Temp 98.3 F (36.8 C) (Oral)   Resp 14   Ht '5\' 10"'$  (1.778 m)   Wt 72.6 kg  SpO2 98%   BMI 22.96 kg/m   Physical Exam  Constitutional: He is oriented to person, place, and time. He appears well-developed and well-nourished. No distress.  HENT:  Head: Normocephalic and atraumatic.  Eyes: No scleral icterus.  Cardiovascular: Normal rate and regular rhythm.  Pulmonary/Chest: Effort normal.  Abdominal: Soft. He exhibits no distension. There is no tenderness. There is no guarding.    Neurological: He is alert and oriented to person, place, and time.  Skin: Skin is warm and dry.  Psychiatric: He has a normal mood and affect. His behavior is normal.  Nursing note and vitals reviewed.    Imaging: Dg Chest 2 View  Result Date: 12/08/2017 CLINICAL DATA:  Pancreatitis EXAM: CHEST - 2 VIEW COMPARISON:  11/12/2017, CT 11/16/2017 FINDINGS: Low lung volumes with atelectasis at the right base. No consolidation or effusion. Cardiomediastinal silhouette within normal limits. No pneumothorax. IMPRESSION: No active cardiopulmonary disease. Low lung volumes with atelectasis at the right base. Electronically Signed   By: Donavan Foil M.D.   On: 12/08/2017 21:04   Ct Abdomen Pelvis W Contrast  Result Date: 12/23/2017 CLINICAL DATA:  37 year old male with say history of severe pancreatitis complicated by large pancreatic pseudocyst formation and a failed attempted cyst gastrostomy (distance between  the gastric antrum and the pseudocyst was greater than 1 cm which is the maximal length of the cyst gastrostomy stent prosthesis). Patient was developing persistent fevers following antibiotic therapy and therefore request was made for percutaneous drain placement which occurred on 12/13/2017. Patient presents today for follow-up evaluation of percutaneous drainage. Drain output is currently minimal and patient is asymptomatic other than minor discomfort at the drain entry site. EXAM: CT ABDOMEN AND PELVIS WITH CONTRAST TECHNIQUE: Multidetector CT imaging of the abdomen and pelvis was performed using the standard protocol following bolus administration of intravenous contrast. CONTRAST:  166m ISOVUE-300 IOPAMIDOL (ISOVUE-300) INJECTION 61% COMPARISON:  Prior CT scan of the abdomen and pelvis 12/08/2017 FINDINGS: Lower chest: The lung bases are clear. Visualized cardiac structures are within normal limits for size. No pericardial effusion. Unremarkable visualized distal thoracic esophagus. Hepatobiliary: Normal hepatic contour and morphology. No discrete hepatic lesions. Normal appearance of the gallbladder. No intra or extrahepatic biliary ductal dilatation. Pancreas: An elongated, lobular peripherally enhancing fluid and gas collection replaces nearly the entirety of the pancreatic parenchyma consistent with the patient's known large pancreatic pseudocyst in diffuse pancreatic necrosis. Overall, the pancreatic pseudocyst has decreased in size presently measuring approximately 9.8 x 6.9 x 3.7 cm (volume = 130 cm^3) compared to 12.1 x 12.3 x 9.3 cm (volume = 720 cm^3) previously. The drainage catheter remains well positioned within the fluid collection although it has slightly pulled back resulting in the most proximal several sideholes in the retroperitoneal fat rather than within the pseudocyst. Additionally, there now appears to be a fistulous communication between the proximal descending duodenum and the  pancreatic pseudocyst in the region of the pancreatic head. Spleen: Normal in size without focal abnormality. Adrenals/Urinary Tract: The left adrenal gland is not well seen and may be surgically absent or obscured by the pancreatic pseudocyst and associated inflammatory changes. The left kidney is surgically absent. The right adrenal gland and right kidney remain normal in appearance. No hydronephrosis or nephrolithiasis. Unremarkable ureters and bladder. Stomach/Bowel: The stomach is within normal limits. There is mild hyperenhancement of the duodenal mucosa in the D2 segment where it passes closely adjacent to the pseudocyst. As mentioned above, there is a short segment fistulous communication that appears  to communicate between the proximal D2 segment of the descending duodenum and the adjacent pseudocyst in the pancreatic head. Secondary inflammatory changes are present in the left colon both at the splenic flexure and in the proximal descending colon. Vascular/Lymphatic: No significant vascular findings are present. No enlarged abdominal or pelvic lymph nodes. Reproductive: Prostate is unremarkable. Other: No abdominal wall hernia or abnormality. No abdominopelvic ascites. Musculoskeletal: No acute fracture or aggressive appearing lytic or blastic osseous lesion. IMPRESSION: 1. Persistent, but significantly decreased pancreatic pseudocyst compared to 12/08/2017. The residual pseudocyst is thick walled, peripherally enhancing and contains low attenuation material and gas with an estimated volume of approximately 130 mL. The drainage catheter remains well positioned, however drain output is scant. 2. New apparent fistulous communication between the proximal descending duodenum and the pseudocyst in the region of the pancreatic head. 3. Persistent secondary inflammatory changes of the colon in the region of the splenic flexure and proximal descending colon. 4. No new or acute abnormality identified.  Electronically Signed   By: Jacqulynn Cadet M.D.   On: 12/23/2017 13:35   Ct Abdomen Pelvis W Contrast  Result Date: 12/08/2017 CLINICAL DATA:  Sepsis and fever.  Recent pancreatitis. EXAM: CT ABDOMEN AND PELVIS WITH CONTRAST TECHNIQUE: Multidetector CT imaging of the abdomen and pelvis was performed using the standard protocol following bolus administration of intravenous contrast. CONTRAST:  177m ISOVUE-300 IOPAMIDOL (ISOVUE-300) INJECTION 61% COMPARISON:  Most recent CT 11/16/2017 FINDINGS: Lower chest: Resolved left pleural effusion with mild residual atelectasis. Minimal right lung base atelectasis. Small paraesophageal node, unchanged. Hepatobiliary: No focal liver abnormality is seen. No gallstones, gallbladder wall thickening, or biliary dilatation. Pancreas: Again seen sequela of necrotizing pancreatitis with near complete fluid replacement of pancreatic parenchyma. No internal air. Scattered irregular foci of soft tissue attenuation within the pancreas for example image 31 and 35 series 3 likely represent residual pancreatic parenchyma. Surrounding peripancreatic inflammatory stranding persists but is improved in the interim. The previous lesser sac fluid collection now appears contiguous with the necrotic pancreatic parenchyma, and is not separately distinguished. Small amount of fluid insinuates superiorly in the lesser sac image 28 series 3 with slight increase from prior exam. Fluid in the right pericolic gutter has diminished but appears organizing and is lobular in appearance. No discrete extra pancreatic fluid collection. Spleen: Normal in size. Fluid or pace pancreatic tail abuts the splenic hilum. The splenic vein slightly narrowed but remains patent. Adrenals/Urinary Tract: Post left nephrectomy. Compensatory hypertrophy of the right kidney. No right hydronephrosis. Urinary bladder is physiologically distended without wall thickening. Stomach/Bowel: Bowel evaluation is limited in the  absence of enteric contrast. Mild gastric wall enhancement and wall thickening likely reactive due to adjacent pancreatic inflammation. No evidence of bowel obstruction. Borderline wall thickening of the ascending colon is likely reactive. Air-filled appendix is prominent diameter proximally but without periappendiceal inflammation. Vascular/Lymphatic: Splenic vein, portal vein, and mesenteric vessels are patent. Normal caliber abdominal aorta. Multiple small retroperitoneal nodes are likely reactive and not enlarged by size criteria. Reproductive: Prostate is unremarkable. Other: No free air. Persistent soft tissue density in the right pericolic gutter with decreased fluid but increasing lobular soft tissue components. Musculoskeletal: Bone islands in the pelvis and proximal femora. There are no acute or suspicious osseous abnormalities. IMPRESSION: 1. Severe necrotizing pancreatitis. Persistent but slight decreased peripancreatic inflammation since CT 3 weeks ago. The previous lesser sac fluid collection is now contiguous with the necrotic pancreatic parenchyma and not separately distinguished. Slight increased fluid in the lesser sac  but no new organizing fluid collection. 2. Decreasing fluid in the right pericolic gutter with residual soft tissue nodular densities, no internal air to suggest superimposed infection. 3. Persistent wall thickening of the ascending colon, likely reactive. 4. Resolved left pleural effusion. Electronically Signed   By: Keith Rake M.D.   On: 12/08/2017 23:11   Ct Image Guided Drainage By Percutaneous Catheter  Result Date: 12/13/2017 INDICATION: History of pancreatic pseudocyst post failed attempted cyst-gastrostomy tube placement. Patient has been evaluated by the surgical service and request has been made for placement of a percutaneous drainage catheter for infection source control purposes. EXAM: CT IMAGE GUIDED DRAINAGE BY PERCUTANEOUS CATHETER COMPARISON:  CT abdomen  and pelvis - 12/08/2017; 11/16/2017; 11/04/2017 MEDICATIONS: The patient is currently admitted to the hospital and receiving intravenous antibiotics. The antibiotics were administered within an appropriate time frame prior to the initiation of the procedure. ANESTHESIA/SEDATION: Moderate (conscious) sedation was employed during this procedure. A total of Versed 4 mg and Fentanyl 150 mcg was administered intravenously. Moderate Sedation Time: 22 minutes. The patient's level of consciousness and vital signs were monitored continuously by radiology nursing throughout the procedure under my direct supervision. CONTRAST:  None COMPLICATIONS: None immediate. PROCEDURE: Informed written consent was obtained from the patient after a discussion of the risks, benefits and alternatives to treatment. The patient was placed supine, slightly RPO on the CT gantry and a pre procedural CT was performed re-demonstrating the known abscess/fluid collection within the pancreatic bed with dominant air and fluid containing pancreatic pseudocyst appearing slightly smaller since undergoing attempted cyst gastrostomy placement but with residual component measuring at least 10.9 x 4.0 cm (26, series 3). The procedure was planned. A timeout was performed prior to the initiation of the procedure. The skin overlying the left lateral abdomen was prepped and draped in the usual sterile fashion. The overlying soft tissues were anesthetized with 1% lidocaine with epinephrine. Appropriate trajectory was planned with the use of a 22 gauge spinal needle. An 18 gauge trocar needle was advanced into the abscess/fluid collection however the wire was noted to be coiled within the likely loculated lateral component of the collection. As such, the trocar needle was exchanged for a 15 cm Yueh sheath catheter which was utilized to manipulate the Amplatz wire to the medial aspect of the pancreatic pseudocyst. Appropriate position was confirmed with CT imaging.  The tract was serially dilated allowing placement of a 10 [place] all-purpose multi side-hole biliary drainage catheter. Appropriate positioning was confirmed with a limited postprocedural CT scan. 25 ml of brown-colored purulent appearing fluid was aspirated. The tube was connected to a JP bulb and sutured in place. A dressing was placed. The patient tolerated the procedure well without immediate post procedural complication. IMPRESSION: Successful CT guided placement of a 10 [place] all purpose multi side-hole biliary drain catheter into the pancreatic pseudocyst with aspiration of 25 cc of brown colored, purulent appearing fluid. Samples were sent to the laboratory as requested by the ordering clinical team. Electronically Signed   By: Sandi Mariscal M.D.   On: 12/13/2017 16:18   Ir Radiologist Eval & Mgmt  Result Date: 12/23/2017 Please refer to notes tab for details about interventional procedure. (Op Note)   Labs:  CBC: Recent Labs    12/13/17 0833 12/14/17 0832 12/15/17 0753 12/16/17 0315  WBC 5.4 6.7 8.0 7.1  HGB 10.8* 11.4* 10.7* 11.3*  HCT 33.3* 34.9* 33.0* 35.2*  PLT 243 303 334 355    COAGS: Recent Labs  11/13/17 0414 11/17/17 0803 11/24/17 1253 12/09/17 0313  INR 1.07 1.07 1.2* 1.37  APTT  --   --   --  35    BMP: Recent Labs    12/12/17 0850 12/13/17 0833 12/14/17 0832 12/16/17 0315  NA 135 139 137 137  K 4.1 3.5 3.9 3.6  CL 102 103 103 102  CO2 '22 23 25 23  '$ GLUCOSE 138* 146* 150* 148*  BUN 5* <5* 5* 6  CALCIUM 8.6* 8.8* 9.0 9.0  CREATININE 1.03 0.90 0.97 0.89  GFRNONAA >60 >60 >60 >60  GFRAA >60 >60 >60 >60    LIVER FUNCTION TESTS: Recent Labs    12/09/17 0313 12/10/17 0618 12/13/17 0833 12/14/17 0832  BILITOT 2.0* 1.3* 0.7 0.7  AST 34 '26 28 27  '$ ALT 38 31 38 37  ALKPHOS 97 88 99 103  PROT 6.1* 5.6* 6.3* 6.9  ALBUMIN 2.9* 2.4* 2.8* 3.0*    TUMOR MARKERS: No results for input(s): AFPTM, CEA, CA199, CHROMGRNA in the last 8760  hours.  Assessment and Plan:  Clinically, Mr. Kemmerer is doing considerably better and is asymptomatic.  Unfortunately, while his infected pseudocyst has certainly decreased in size, there is still a relatively large persistent fluid and gas collection in the pancreatic bed that appears to have now fistulized into the descending duodenum.  His existing drainage catheter is no longer draining and may either be clogged or simply be too small to drain the remaining fluid.  I briefly considered removal of the drainage catheter, however with the volume of fluid and gas remaining on the CT imaging, I feel that we need to at least attempt an exchange and upsize to a larger tube in an effort to get out to the remaining material if possible.  The fistulous connection with the duodenum may actually be clinically beneficial if this provides a spontaneous auto cystoenterotomy for internal drainage.  I discussed this patient with Dr. Rush Landmark at length.  He agrees that drain upsize is a worthwhile endeavor.  If that is unsuccessful, the question remains whether the patient will require pancreatic necrosectomy.  1.) schedule for drain exchange and upsize (largest possible drainage catheter).  At the time of drain upsize, if there is not adequate drainage/aspiration from the larger drainage catheter, careful consideration should be given to removing the tube entirely and contacting Dr. Rush Landmark of gastroenterology to discuss surgical referral.    Electronically Signed: Jacqulynn Cadet 12/23/2017, 3:50 PM   I spent a total of  15 Minutes in face to face in clinical consultation, greater than 50% of which was counseling/coordinating care for percutaneous drain.

## 2017-12-24 ENCOUNTER — Telehealth: Payer: Self-pay

## 2017-12-24 ENCOUNTER — Other Ambulatory Visit (HOSPITAL_COMMUNITY): Payer: Self-pay | Admitting: Interventional Radiology

## 2017-12-24 ENCOUNTER — Other Ambulatory Visit: Payer: Self-pay

## 2017-12-24 DIAGNOSIS — K863 Pseudocyst of pancreas: Secondary | ICD-10-CM

## 2017-12-24 DIAGNOSIS — K8591 Acute pancreatitis with uninfected necrosis, unspecified: Secondary | ICD-10-CM

## 2017-12-24 NOTE — Telephone Encounter (Signed)
-----   Message from Irving Copas., MD sent at 12/24/2017 11:01 AM EDT ----- Myrle Sheng, That does seem like a good plan. The earliest I can get him in for a procedure with me is also Wednesday which would be planned at 1200 PM for an EGD/EUS (Junah Yam my Advanced RN has worked on this). I could look endoscopically and see if we see a fistulous tract and if I can place a wire into the collection under fluoroscopy and place a stent into the region and enlarge the tract then we can leave stents in place to keep it open. If I can't then it would be reasonable to see if you all could fish a wire through a fistulous connection and place larger drain. What time would you be planning for procedure? Do you think repeat imaging the day prior would help you and I? Thanks. Gabe ----- Message ----- From: Jacqulynn Cadet, MD Sent: 12/24/2017   8:53 AM EDT To: Marcelyn Bruins, Irving Copas., MD  Gabe,  We can definitely keep the drain in until you have a chance to evaluate further, no problem there.  I do think he needs that drain replaced though, his out-put has stopped and the drain is pulled back a bit on the CT.  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    We'll get him in and exchange for a fresh and slightly larger drain which should not cause any ill-effect.  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    If we can confirm that fistulous track, that gives [place] even more options.  If you can't see it from the endoscopic side, we could try to cross it with a wire from the cyst side.  Could even use a small balloon to try and widen it to allow drainage by flushing the catheter.   I will ask my staff Jocelyn Lamer, CC'd) to try and schedule the procedure for next Wed 10/2 at [facility] so I can perform it.  Jocelyn Lamer, can we do  that?  Thanks,  Heath  ----- Message ----- From: Irving Copas., MD Sent: 12/24/2017   8:34 AM EDT To: Marcelyn Bruins, Jacqulynn Cadet, MD  Heath, I hope you are well. I thought a bit more about patient. What if we just plan to repeat a CT scan in 1-week to see what things look like in regards to the fluid collection before we plan a repeat procedure or drain exchange. I'm looking at trying to find a way to get in a potential procedure in the next 1-2 weeks. However, I am a bit uncomfortable without the drain. However, if we just get a CT scan on Tuesday and see that the fluid collection has continued to decrease in size, maybe we are gaining some information about the potential of the fistulous-appearing connection having some effect. I spoke with Linna Hoff about this patient and he thought this might be reasonable. I'm going to talk with patient later today about my potential plans. Do you think that is OK Heath, plus I didn't see when the repeat procedure would be scheduled for yet either? Thanks again for the help.  Gabe ----- Message ----- From: Jacqulynn Cadet, MD Sent: 12/23/2017   4:04 PM EDT To: Marcelyn Bruins, Irving Copas., MD

## 2017-12-24 NOTE — Telephone Encounter (Signed)
EUS scheduled, pt instructed and medications reviewed.  Patient instructions mailed to home.  Patient to call with any questions or concerns.  FYI:  Dr Rush Landmark the pt was scheduled for 12/30/17 at 12 noon [facility].  I have blocked your 1:30 and 2:00 pm [facility] spots.  I moved your 1:30 pm spot to 2:30 pm.  IR is suppose to move the appt at their office to a later time so that the case can be done.

## 2017-12-24 NOTE — Telephone Encounter (Signed)
-----   Message from Irving Copas., MD sent at 12/24/2017  3:43 AM EDT ----- Regarding: Patient needs a procedure Dear Chong Sicilian, I hope you are well. This patient is going to need an EUS/EGD 90 minute slot in the hospital. I need to see about finding a way to add this on next week. Unfortunately, I cannot do it on Friday as I am out of town. Can you check about a potential procedure time on 10/2 in the afternoon with Anesthesia at the hospital. If there is room for this, what I would like to do is see if it is time that is available at lunch time, that we try and move my 130 procedure to later in the day so I can go to the hospital and do the procedure. If however, it is available and later in the day, we may need to reschedule the 2 afternoon patients to be done another day.  If there is no time next week for this, then please let me know and we will have to expedite a referral to [facility] so they can help with this patient. Please let me know as soon as you can and I can reach out to my [facility] colleages in case we need to. Thanks. Chester Holstein

## 2017-12-27 NOTE — Anesthesia Postprocedure Evaluation (Signed)
Anesthesia Post Note  Patient: Leeum Sankey  Procedure(s) Performed: UPPER ESOPHAGEAL ENDOSCOPIC ULTRASOUND (EUS) (N/A ) ESOPHAGOGASTRODUODENOSCOPY (EGD) WITH PROPOFOL (N/A ) BIOPSY     Patient location during evaluation: PACU Anesthesia Type: General Level of consciousness: awake Pain management: pain level controlled Vital Signs Assessment: post-procedure vital signs reviewed and stable Respiratory status: spontaneous breathing Cardiovascular status: stable Postop Assessment: no apparent nausea or vomiting Anesthetic complications: no    Last Vitals:  Vitals:   12/17/17 0726 12/17/17 1546  BP: (!) 136/95 (!) 139/101  Pulse: 64 68  Resp: 14 15  Temp: 36.6 C 36.9 C  SpO2: 98% 98%    Last Pain:  Vitals:   12/17/17 1546  TempSrc: Oral  PainSc:    Pain Goal: Patients Stated Pain Goal: 0 (12/15/17 0800)               Keshon Markovitz JR,JOHN Mateo Flow

## 2017-12-27 NOTE — Telephone Encounter (Signed)
I had called and spoke with patient on 9/26 to go over options and next steps in plan. He is doing and feeling well currently and we hope to maintain this. We have arranged for an EGD/EUS this coming Wednesday. We will attempt to have IR be available after procedure for placement of a larger bore drain. 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  With a larger drain we could be more aggressive with flushing. If the location is good enough for placement, this could be access point for Necrosectomy, but these areas are difficult when in the duodenum. He agrees with this plan of action.  Justice Britain, MD [facility] Office # [phone]

## 2017-12-28 ENCOUNTER — Telehealth (HOSPITAL_COMMUNITY): Payer: Self-pay

## 2017-12-28 NOTE — Telephone Encounter (Signed)
Called to schedule drain exchange, no answer, left vm. AW 

## 2017-12-29 ENCOUNTER — Encounter (HOSPITAL_COMMUNITY): Payer: Self-pay | Admitting: *Deleted

## 2017-12-29 ENCOUNTER — Other Ambulatory Visit: Payer: Self-pay

## 2017-12-29 ENCOUNTER — Other Ambulatory Visit: Payer: 59

## 2017-12-29 NOTE — Progress Notes (Signed)
Spoke with pt for pre-op call. Pt denies cardiac history. 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 Pt's last A1C was 5.5 on 11/04/17. He states his fasting blood sugar is usually between 120-140. Instructed pt to take 1/2 of his regular dose of Levemir Insulin this evening, will take 7 units. Instructed pt to check his blood sugar in the AM when he wakes up and every 2 hours until he leaves for the hospital. If blood sugar is >220 take 1/2 of usual correction dose of Novolog insulin. If blood sugar is 70 or below, treat with 1/2 cup of clear juice (apple or cranberry) and recheck blood sugar 15 minutes after drinking juice.  EKG - 11/13/17 - in Epic

## 2017-12-30 ENCOUNTER — Encounter (HOSPITAL_COMMUNITY): Payer: Self-pay

## 2017-12-30 ENCOUNTER — Ambulatory Visit (HOSPITAL_COMMUNITY): Payer: 59 | Admitting: Anesthesiology

## 2017-12-30 ENCOUNTER — Encounter (HOSPITAL_COMMUNITY): Payer: Self-pay | Admitting: Gastroenterology

## 2017-12-30 ENCOUNTER — Other Ambulatory Visit (HOSPITAL_COMMUNITY): Payer: 59

## 2017-12-30 ENCOUNTER — Encounter (HOSPITAL_COMMUNITY)
Admission: RE | Disposition: A | Discharge status: Home / Self Care | Payer: Self-pay | Source: Ambulatory Visit | Attending: Gastroenterology

## 2017-12-30 ENCOUNTER — Ambulatory Visit (HOSPITAL_COMMUNITY)
Admission: RE | Admit: 2017-12-30 | Discharge: 2017-12-30 | Disposition: A | Discharge status: Home / Self Care | Payer: 59 | Source: Ambulatory Visit | Attending: Gastroenterology | Admitting: Gastroenterology

## 2017-12-30 ENCOUNTER — Ambulatory Visit (HOSPITAL_COMMUNITY)
Admission: RE | Admit: 2017-12-30 | Discharge: 2017-12-30 | Disposition: A | Discharge status: Home / Self Care | Payer: 59 | Source: Ambulatory Visit | Attending: Interventional Radiology | Admitting: Interventional Radiology

## 2017-12-30 ENCOUNTER — Other Ambulatory Visit: Payer: Self-pay

## 2017-12-30 DIAGNOSIS — K2980 Duodenitis without bleeding: Secondary | ICD-10-CM | POA: Insufficient documentation

## 2017-12-30 DIAGNOSIS — I10 Essential (primary) hypertension: Secondary | ICD-10-CM | POA: Insufficient documentation

## 2017-12-30 DIAGNOSIS — Z87891 Personal history of nicotine dependence: Secondary | ICD-10-CM | POA: Diagnosis not present

## 2017-12-30 DIAGNOSIS — Z4803 Encounter for change or removal of drains: Secondary | ICD-10-CM

## 2017-12-30 DIAGNOSIS — Z85528 Personal history of other malignant neoplasm of kidney: Secondary | ICD-10-CM | POA: Insufficient documentation

## 2017-12-30 DIAGNOSIS — Z905 Acquired absence of kidney: Secondary | ICD-10-CM

## 2017-12-30 DIAGNOSIS — K316 Fistula of stomach and duodenum: Secondary | ICD-10-CM | POA: Insufficient documentation

## 2017-12-30 DIAGNOSIS — K8689 Other specified diseases of pancreas: Secondary | ICD-10-CM | POA: Diagnosis present

## 2017-12-30 DIAGNOSIS — K219 Gastro-esophageal reflux disease without esophagitis: Secondary | ICD-10-CM | POA: Diagnosis not present

## 2017-12-30 DIAGNOSIS — K3189 Other diseases of stomach and duodenum: Secondary | ICD-10-CM | POA: Insufficient documentation

## 2017-12-30 DIAGNOSIS — K8591 Acute pancreatitis with uninfected necrosis, unspecified: Secondary | ICD-10-CM | POA: Insufficient documentation

## 2017-12-30 DIAGNOSIS — K863 Pseudocyst of pancreas: Secondary | ICD-10-CM | POA: Insufficient documentation

## 2017-12-30 HISTORY — DX: Pneumonia, unspecified organism: J18.9

## 2017-12-30 HISTORY — DX: Type 2 diabetes mellitus without complications: E11.9

## 2017-12-30 HISTORY — PX: ESOPHAGOGASTRODUODENOSCOPY (EGD) WITH PROPOFOL: SHX5813

## 2017-12-30 HISTORY — PX: IR CATHETER TUBE CHANGE: IMG717

## 2017-12-30 LAB — GLUCOSE, CAPILLARY: Glucose-Capillary: 115 mg/dL — ABNORMAL HIGH (ref 70–99)

## 2017-12-30 SURGERY — ESOPHAGOGASTRODUODENOSCOPY (EGD) WITH PROPOFOL
Anesthesia: General

## 2017-12-30 MED ORDER — LIDOCAINE HCL (PF) 1 % IJ SOLN
INTRAMUSCULAR | Status: AC | PRN
  Administered 2017-12-30: 5 mL

## 2017-12-30 MED ORDER — AMOXICILLIN-POT CLAVULANATE 875-125 MG PO TABS: 1.0000 | ORAL_TABLET | Freq: Two times a day (BID) | ORAL | 0 refills | Status: AC

## 2017-12-30 MED ORDER — IOPAMIDOL (ISOVUE-300) INJECTION 61%
INTRAVENOUS | Status: AC
  Administered 2017-12-30: 15 mL
  Filled 2017-12-30: qty 50

## 2017-12-30 MED ORDER — ALBUMIN HUMAN 5 % IV SOLN
INTRAVENOUS | Status: DC | PRN
  Administered 2017-12-30: 13:00:00 via INTRAVENOUS

## 2017-12-30 MED ORDER — MIDAZOLAM HCL 5 MG/5ML IJ SOLN
INTRAMUSCULAR | Status: DC | PRN
  Administered 2017-12-30: 2 mg via INTRAVENOUS

## 2017-12-30 MED ORDER — MIDAZOLAM HCL 2 MG/2ML IJ SOLN
INTRAMUSCULAR | Status: AC | PRN
  Administered 2017-12-30: 0.5 mg via INTRAVENOUS

## 2017-12-30 MED ORDER — PHENYLEPHRINE HCL 10 MG/ML IJ SOLN
INTRAMUSCULAR | Status: DC | PRN
  Administered 2017-12-30: 240 ug via INTRAVENOUS
  Administered 2017-12-30 (×2): 80 ug via INTRAVENOUS

## 2017-12-30 MED ORDER — MIDAZOLAM HCL 2 MG/2ML IJ SOLN
INTRAMUSCULAR | Status: AC
  Filled 2017-12-30: qty 2

## 2017-12-30 MED ORDER — SODIUM CHLORIDE 0.9 % IV SOLN: INTRAVENOUS | Status: DC

## 2017-12-30 MED ORDER — FENTANYL CITRATE (PF) 100 MCG/2ML IJ SOLN
INTRAMUSCULAR | Status: AC
  Filled 2017-12-30: qty 2

## 2017-12-30 MED ORDER — ONDANSETRON HCL 4 MG/2ML IJ SOLN
INTRAMUSCULAR | Status: DC | PRN
  Administered 2017-12-30: 4 mg via INTRAVENOUS

## 2017-12-30 MED ORDER — LACTATED RINGERS IV SOLN
INTRAVENOUS | Status: DC
  Administered 2017-12-30: 11:00:00 via INTRAVENOUS

## 2017-12-30 MED ORDER — EPHEDRINE SULFATE 50 MG/ML IJ SOLN
INTRAMUSCULAR | Status: DC | PRN
  Administered 2017-12-30 (×2): 10 mg via INTRAVENOUS

## 2017-12-30 MED ORDER — FENTANYL CITRATE (PF) 100 MCG/2ML IJ SOLN
INTRAMUSCULAR | Status: AC | PRN
  Administered 2017-12-30 (×2): 25 ug via INTRAVENOUS

## 2017-12-30 MED ORDER — CIPROFLOXACIN IN D5W 400 MG/200ML IV SOLN
INTRAVENOUS | Status: AC
  Filled 2017-12-30: qty 200

## 2017-12-30 MED ORDER — LIDOCAINE 2% (20 MG/ML) 5 ML SYRINGE
INTRAMUSCULAR | Status: DC | PRN
  Administered 2017-12-30: 60 mg via INTRAVENOUS

## 2017-12-30 MED ORDER — SODIUM CHLORIDE 0.9% FLUSH: 5.0000 mL | Freq: Three times a day (TID) | INTRAVENOUS | Status: DC

## 2017-12-30 MED ORDER — LIDOCAINE HCL 1 % IJ SOLN
INTRAMUSCULAR | Status: AC
  Filled 2017-12-30: qty 20

## 2017-12-30 MED ORDER — FENTANYL CITRATE (PF) 100 MCG/2ML IJ SOLN
INTRAMUSCULAR | Status: DC | PRN
  Administered 2017-12-30: 50 ug via INTRAVENOUS
  Administered 2017-12-30: 25 ug via INTRAVENOUS
  Administered 2017-12-30: 100 ug via INTRAVENOUS

## 2017-12-30 MED ORDER — SUCCINYLCHOLINE CHLORIDE 20 MG/ML IJ SOLN
INTRAMUSCULAR | Status: DC | PRN
  Administered 2017-12-30: 100 mg via INTRAVENOUS

## 2017-12-30 MED ORDER — CIPROFLOXACIN IN D5W 400 MG/200ML IV SOLN
400.0000 mg | Freq: Once | INTRAVENOUS | Status: AC
  Administered 2017-12-30: 400 mg via INTRAVENOUS

## 2017-12-30 MED ORDER — SODIUM CHLORIDE 0.9 % IV SOLN
INTRAVENOUS | Status: AC | PRN
  Administered 2017-12-30: 10 mL/h via INTRAVENOUS

## 2017-12-30 MED ORDER — PROPOFOL 10 MG/ML IV BOLUS
INTRAVENOUS | Status: DC | PRN
  Administered 2017-12-30: 200 mg via INTRAVENOUS

## 2017-12-30 SURGICAL SUPPLY — 14 items

## 2017-12-30 NOTE — Discharge Instructions (Addendum)
Biliary Drainage Catheter Home Guide A biliary drainage catheter is a thin, flexible tube that is inserted through your skin into the bile ducts in your liver. Bile is a thick yellow or green fluid that helps digest fat in foods. The purpose of a biliary drainage catheter is to keep bile from backing up into your liver. Backup of bile can occur when there is a blockage that prevents bile from moving from the bile ducts into the small intestine as it should. The blockage can be caused by gallstones, a tumor, or scar tissue. There are three types of biliary drainage:  External biliary drainage. With this type, bile is only drained into a collection bag outside your body (external collection bag).  Internal-external biliary drainage. With this type, bile is drained to an external collection bag as well as into your small intestine.  Internal biliary drainage. With this type, bile is only drained into your small intestine.  General home care includes these daily actions:  Inspection of your drainage catheter.  Flushing your drainage catheter with saline.  Emptying drainage from the collection bag (if present).  Recording the amount of drainage.  Checking the catheter insertion site for signs of infection. Check for: ? Redness, swelling, or pain. ? Fluid or blood. ? Warmth. ? Pus or a bad smell. How do I inspect my drainage catheter?  Check the dressing to make sure that it is dry and clean.  Look at the skin around the drainage catheter when changing the dressing for any problems such as redness, rash, or skin breakdown.  Check the drainage bag to make sure that drainage fluid is flowing into the bag well. Note the color and amount compared to other days.  Check the drainage catheter and bag for any cracks or kinks in the tubing. How do I change my dressing? The dressing over the drainage catheter should be changed every other day, or more often if needed to keep the dressing dry. Your  health care provider will instruct you about how often to change your dressing. Supplies needed:  Mild soap and warm water.  Split gauze pads, 4 x 4 inches (10 x 10 cm) to use as a dressing sponge.  Gauze pads, 4 x 4 inches (10 x 10 cm) or adhesive dressing cover.  Paper tape. How to change the dressing: 1. Wash your hands with soap and water. 2. Gently remove the old dressing. Avoid using scissors to remove the dressing because they may damage the drainage catheter. 3. Wash the skin around the insertion site with mild soap and warm water, rinse well, then pat the area dry with a clean cloth. 4. Check the skin around the drainage catheter for redness or swelling, or for yellow or green discharge that has a bad smell. 5. If the drainage catheter was stitched (sutured) to the skin, inspect the suture to make sure it is still anchored in the skin. 6. Do not apply creams, ointments, or alcohol to the site. Allow the skin to air-dry completely before you apply a new dressing. 7. Place the drainage catheter through the slit in a dressing sponge. The dressing sponge should slide under the disk that holds the drainage catheter in place. 8. Cover the drainage catheter and the dressing sponge with a 4 x 4 inch (10 x 10 cm) gauze. The drainage catheter should rest on the gauze and not on the skin. 9. Tape the dressing to the skin. 10. You may be instructed to use an  adhesive dressing covering over the top of this in place of the gauze and tape. 11. Wash your hands with soap and water. How do I flush my drainage catheter? Biliary drainagecatheters should be flushed daily, or as often as told by your health care provider. The end of the drainage catheter is closed using an IV cap. A syringe can be directly connected to the IV cap. Supplies needed:  Alcohol swab.  10 mL prefilled normal saline syringe. How to flush the drainage catheter: 1. Wash your hands with soap and water. 2. If your drainage  catheter has a stopcock attached to it, turn the stopcock toward the drainage bag. This will allow the saline to flow in the direction of your body. 3. Clean the IV cap with an alcohol swab. 4. Screw the tip of a 10 mL normal saline syringe onto the IV cap. 5. Inject the saline over 5-10 seconds. If you feel resistance while injecting, stop immediately. Avoid  pulling back on the plunger. Doing that could increase your risk of infection. 6. Remove the syringe from the cap. Turn the stopcock so that fluid flows from your body into the drainage bag. You may notice more fluid flowing into the bag after you have completed the flush. 7. Flush drain 3-4 times a day How do I attach a bag to my drainage catheter? If you are having trouble with your internal biliary drain, you may be directed by your health care provider to use bag drainage until you can be seen to fix the problem. For this reason, you should always have a collection bag and connecting tubing at home. If you do not have these supplies, remember to ask for them at your next appointment. 1. Remove the bag and the connecting tubing from their packaging. 2. Connect the funnel end of the tubing to the bag's cone-shaped stem. 3. Remove the IV cap from the biliary drain. To do this, unscrew it and replace it with the screw-on end of the tubing. 4. Save the IV cap in a plastic storage bag that can be sealed.  How do I empty my collection bag? Empty the collection bag whenever it becomes 2/3 full. Also empty it before you go to sleep. Most collection bags have a drainage valve at the bottom so the bag can be that allows them to be emptied easily. 1. Wash your hands with soap and water. 2. Hold the collection bag over the toilet, basin, or collection container. Use a measuring container if your health care provider told you to measure the drainage. 3. Unscrew the valve to open it, and allow the bag to drain. 4. Close the valve securely to avoid  leakage. 5. Use a tissue or disposable napkin to wipe the valve clean. 6. Wash the measuring container with soap and water. 7. Record the amount of drainage as told by your health care provider.  Contact a health care provider if:  Your pain gets worse after it had improved, and it is not relieved with pain medicines.  You have any questions about caring for your drainage catheter or collection bag.  You have any of these around your catheter insertion site or coming from it: ? Skin breakdown. ? Redness, swelling, or pain. ? Fluid or blood. ? Warmth to the touch. ? Pus or a bad smell. Get help right away if:  You have a fever or chills.  Your redness, swelling, or pain at the catheter insertion site gets worse, even though you  are cleaning it well.  You have leakage of bile around the drainage catheter.  Your drainage catheter becomes blocked or clogged.  Your drainage catheter comes out.   Follow up at drain clinic in 2 weeks  This information is not intended to replace advice given to you by your health care provider. Make sure you discuss any questions you have with your health care provider. Document Released: 01/05/2013 Document Revised: 02/04/2016 Document Reviewed: 02/04/2016 Elsevier Interactive Patient Education  2017 [place]. Moderate Conscious Sedation, Adult, Care After These instructions provide you with information about caring for yourself after your procedure. Your health care provider may also give you more specific instructions. Your treatment has been planned according to current medical practices, but problems sometimes occur. Call your health care provider if you have any problems or questions after your procedure. What can I expect after the procedure? After your procedure, it is common:  To feel sleepy for several hours.  To feel clumsy and have poor balance for several hours.  To have poor judgment for several hours.  To vomit if you eat too  soon.  Follow these instructions at home: For at least 24 hours after the procedure:   Do not: ? Participate in activities where you could fall or become injured. ? Drive. ? Use heavy machinery. ? Drink alcohol. ? Take sleeping pills or medicines that cause drowsiness. ? Make important decisions or sign legal documents. ? Take care of children on your own.  Rest. Eating and drinking  Follow the diet recommended by your health care provider.  If you vomit: ? Drink water, juice, or soup when you can drink without vomiting. ? Make sure you have little or no nausea before eating solid foods. General instructions  Have a responsible adult stay with you until you are awake and alert.  Take over-the-counter and prescription medicines only as told by your health care provider.  If you smoke, do not smoke without supervision.  Keep all follow-up visits as told by your health care provider. This is important. Contact a health care provider if:  You keep feeling nauseous or you keep vomiting.  You feel light-headed.  You develop a rash.  You have a fever. Get help right away if:  You have trouble breathing. This information is not intended to replace advice given to you by your health care provider. Make sure you discuss any questions you have with your health care provider. Document Released: 01/05/2013 Document Revised: 08/20/2015 Document Reviewed: 07/07/2015 Elsevier Interactive Patient Education  [employer].

## 2017-12-30 NOTE — Anesthesia Postprocedure Evaluation (Signed)
Anesthesia Post Note  Patient: Paul [name]  Procedure(s) Performed: ESOPHAGOGASTRODUODENOSCOPY (EGD) WITH PROPOFOL Necrosectomy (N/A )     Patient location during evaluation: PACU Anesthesia Type: General Level of consciousness: awake and alert Pain management: pain level controlled Vital Signs Assessment: post-procedure vital signs reviewed and stable Respiratory status: spontaneous breathing, nonlabored ventilation, respiratory function stable and patient connected to nasal cannula oxygen Cardiovascular status: blood pressure returned to baseline and stable Postop Assessment: no apparent nausea or vomiting Anesthetic complications: no    Last Vitals:  Vitals:   12/30/17 1410 12/30/17 1420  BP: 128/80 125/79  Pulse: 100 93  Resp: 19 (!) 9  Temp:    SpO2: 97% 99%    Last Pain:  Vitals:   12/30/17 1420  TempSrc:   PainSc: 0-No pain                 Tiajuana Amass

## 2017-12-30 NOTE — Progress Notes (Signed)
Referring Physician(s): McCullough,Heath/ Dr. Rush Landmark  Supervising Physician: Sandi Mariscal  Patient Status:  [facility] - outpt  Chief Complaint: Pancreatic pseudocyst drain placed 12/13/17 by Dr. Pascal Lux - patient for drain exchange and upsizing today.  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 Pascal Lux during patient's admission to [facility]. He was followed by ID throughout his hospital stay and was discharged to home on Augmentin and later Diflucan was added, he continues to be followed by ID as an outpatient.   Patient was seen in outpatient IR clinic by Dr. Laurence Ferrari on 12/23/17 for follow up CT scan. Patient reported to be asymptomatic at that time and drain output had essentially ceased. CT abdomen/pelvis with contrast which showed a significant decrease in size of the pseudocyst, however there was still a relatively large persistent fluid and gas collection in the pancreatic bed along with fistulization of the pseudocyst with the descending duodenum. There was concern that the drainage catheter was clogged and/or too small to allow drainage of the rest of the fluid - this was discussed with Dr. Rush Landmark who agrees with a drain exchange and attempted upsizing for which patient is scheduled to have done in IR this afternoon.  During his hospital course a EUS guided cysogastrostomy was performed on 12/11/17 by GI however this was unsuccessful which ultimately led to the placement of percutaneous drain in IR. He continues to be followed closely by GI and is scheduled for EGD/EUS today prior to drain exchange/upsizing in IR to attempt to enlarge the tract and place stents.   Patient reports he has been feeling well, appetite has been good. He denies fever, chills, nausea,  vomiting, abdominal pain, change in bowel habits, chest pain or dyspnea.  Allergies: Honey and Other  Medications: Prior to Admission medications   Medication Sig Start Date End Date Taking? Authorizing Provider  allopurinol (ZYLOPRIM) 100 MG tablet Take 100 mg by mouth 2 (two) times daily. 10/07/17   [provider]  amLODipine (NORVASC) 2.5 MG tablet Take 2.5 mg by mouth at bedtime.  10/07/17   [provider]  amoxicillin-clavulanate (AUGMENTIN) 875-125 MG tablet Take 1 tablet by mouth 2 (two) times daily for 14 days. 12/17/17 12/31/17  Domenic Polite, MD  atenolol (TENORMIN) 50 MG tablet Take 0.5 tablets (25 mg total) by mouth daily. Patient taking differently: Take 50 mg by mouth daily.  12/17/17   Domenic Polite, MD  blood glucose meter kit and supplies Dispense based on patient and insurance preference. Use up to four times daily as directed. (FOR ICD-10 E10.9, E11.9). 11/18/17   Florencia Reasons, MD  fluconazole (DIFLUCAN) 100 MG tablet Take 1 tablet (100 mg total) by mouth daily. 12/21/17 01/20/18  Campbell Riches, MD  insulin aspart (NOVOLOG) 100 UNIT/ML FlexPen Before each meal 3 times a day, 140-199 - 2 units, 200-250 - 4 units, 251-299 - 6 units,  300-349 - 8 units,  350 or above 10 units. Insulin PEN if approved, provide syringes and needles if needed. Patient taking differently: Inject 2-4 Units into the skin 3 (three) times daily before meals.  11/18/17   Florencia Reasons, MD  Insulin Pen Needle 31G X 5 MM MISC For insulin injection, please provide a month supply. 11/18/17   Florencia Reasons, MD  LEVEMIR FLEXTOUCH 100 UNIT/ML Pen Inject  8 Units into the skin at bedtime. Patient taking differently: Inject 15 Units into the skin at bedtime.  12/17/17   Domenic Polite, MD     Vital Signs: There were no vitals taken for this visit.  Physical Exam  Constitutional: He is oriented to person, place, and time. No distress.  HENT:  Head: Normocephalic.  Cardiovascular: Normal rate, regular  rhythm and normal heart sounds.  Pulmonary/Chest: Effort normal and breath sounds normal.  Abdominal: Soft. There is no tenderness.  Left abdominal drain in place; insertion site clean, dry, intact.  Neurological: He is alert and oriented to person, place, and time.  Skin: Skin is warm and dry. He is not diaphoretic.  Psychiatric: He has a normal mood and affect. His behavior is normal. Judgment and thought content normal.   MD Evaluation Airway: WNL Heart: WNL Abdomen: WNL Abdomen: WNL Chest/ Lungs: WNL ASA  Classification: 3 Mallampati/Airway Score: One  Imaging: No results found.  Labs:  CBC: Recent Labs    12/13/17 0833 12/14/17 0832 12/15/17 0753 12/16/17 0315  WBC 5.4 6.7 8.0 7.1  HGB 10.8* 11.4* 10.7* 11.3*  HCT 33.3* 34.9* 33.0* 35.2*  PLT 243 303 334 355    COAGS: Recent Labs    11/13/17 0414 11/17/17 0803 11/24/17 1253 12/09/17 0313  INR 1.07 1.07 1.2* 1.37  APTT  --   --   --  35    BMP: Recent Labs    12/12/17 0850 12/13/17 0833 12/14/17 0832 12/16/17 0315  NA 135 139 137 137  K 4.1 3.5 3.9 3.6  CL 102 103 103 102  CO2 '22 23 25 23  '$ GLUCOSE 138* 146* 150* 148*  BUN 5* <5* 5* 6  CALCIUM 8.6* 8.8* 9.0 9.0  CREATININE 1.03 0.90 0.97 0.89  GFRNONAA >60 >60 >60 >60  GFRAA >60 >60 >60 >60    LIVER FUNCTION TESTS: Recent Labs    12/09/17 0313 12/10/17 0618 12/13/17 0833 12/14/17 0832  BILITOT 2.0* 1.3* 0.7 0.7  AST 34 '26 28 27  '$ ALT 38 31 38 37  ALKPHOS 97 88 99 103  PROT 6.1* 5.6* 6.3* 6.9  ALBUMIN 2.9* 2.4* 2.8* 3.0*    Assessment and Plan:  Pancreatic pseudocyst with drain placement in IR 12/13/17 by Dr. Pascal Lux s/p unsuccessful EUS guided cystogastrostomy by GI. Patient seen in IR outpatient clinic for repeat imaging on 12/23/17 which showed persistent fluid and gas collection in pancreatic bed with fistulization with duodenum - this was discussed with Dr. Rush Landmark who was agreeable to attempt drain exchange and upsizing in IR to  hopefully allow further drainage of collection for which patient presents today. Patient also scheduled for EGD/EUS today with GI to attempt to widen fistulous tract and place stents to for further drainage.  Patient seen in endoscopy prior to sedation medications being given, he has no complaints and is agreeable to proceed with drain exchange and upsizing later today in IR. No pre-procedure lab work performed, he has been NPO since last night, he does not take any blood thinning medications.  Risks and benefits discussed with the patient including bleeding, infection, damage to adjacent structures, bowel perforation/fistula connection, and sepsis.  All of the patient's questions were answered, patient is agreeable to proceed.  Consent signed and in chart.   Electronically Signed: Joaquim Nam, PA-C 12/30/2017, 11:05 AM   I spent a total of 25 Minutes at the the patient's bedside AND on the patient's hospital floor or unit, greater than 50% of which  was counseling/coordinating care for pancreatic pseudocyst drain exchange/upsizing.

## 2017-12-30 NOTE — Anesthesia Preprocedure Evaluation (Addendum)
Anesthesia Evaluation  Patient identified by MRN, date of birth, ID band Patient awake    Reviewed: Allergy & Precautions, NPO status , Patient's Chart, lab work & pertinent test results  Airway Mallampati: I       Dental no notable dental hx. (+) Teeth Intact, Missing, Dental Advisory Given,    Pulmonary former smoker,    Pulmonary exam normal breath sounds clear to auscultation       Cardiovascular hypertension, Pt. on medications Normal cardiovascular exam Rhythm:Regular Rate:Normal     Neuro/Psych  Headaches,    GI/Hepatic Neg liver ROS, GERD  ,pancreatitis   Endo/Other  diabetes  Renal/GU Renal disease     Musculoskeletal   Abdominal Normal abdominal exam  (+)   Peds  Hematology  (+) anemia ,   Anesthesia Other Findings   Reproductive/Obstetrics                             Anesthesia Physical  Anesthesia Plan  ASA: III  Anesthesia Plan: General   Post-op Pain Management:    Induction: Intravenous  PONV Risk Score and Plan: 2 and Propofol infusion, Ondansetron and Treatment may vary due to age or medical condition  Airway Management Planned: Oral ETT  Additional Equipment:   Intra-op Plan:   Post-operative Plan: Extubation in OR  Informed Consent: I have reviewed the patients History and Physical, chart, labs and discussed the procedure including the risks, benefits and alternatives for the proposed anesthesia with the patient or authorized representative who has indicated his/her understanding and acceptance.   Dental advisory given  Plan Discussed with: CRNA and Surgeon  Anesthesia Plan Comments:        Anesthesia Quick Evaluation

## 2017-12-30 NOTE — Anesthesia Procedure Notes (Signed)
Procedure Name: Intubation Date/Time: 12/30/2017 12:48 PM Performed by: Doran Clay, CRNA Pre-anesthesia Checklist: Patient identified, Emergency Drugs available, Suction available, Patient being monitored and Timeout performed Patient Re-evaluated:Patient Re-evaluated prior to induction Oxygen Delivery Method: Circle system utilized Preoxygenation: Pre-oxygenation with 100% oxygen Induction Type: IV induction Ventilation: Mask ventilation without difficulty Laryngoscope Size: Miller and 2 Grade View: Grade I Tube type: Oral Tube size: 7.5 mm Number of attempts: 1 Airway Equipment and Method: Stylet Placement Confirmation: ETT inserted through vocal cords under direct vision,  positive ETCO2 and breath sounds checked- equal and bilateral Secured at: 22 cm Tube secured with: Tape Dental Injury: Teeth and Oropharynx as per pre-operative assessment

## 2017-12-30 NOTE — Procedures (Signed)
Pre procedural Dx: Pancreatic pseudocyst Post procedural Dx: Same  Technically successful fluoro guided exchange, upsizing and repositioning of 14 Fr PBD with tip now terminating within the duodenum and side throughout the pancreatic pseudocyst.   EBL: None Complications: None immediate  Ronny Bacon, MD Pager #: [phone]

## 2017-12-30 NOTE — Interval H&P Note (Signed)
History and Physical Interval Note:  12/30/2017 12:39 PM  Paul [name]  has presented today for surgery, with the diagnosis of pancreatitis  The various methods of treatment have been discussed with the patient and family. After consideration of risks, benefits and other options for treatment, the patient has consented to  Procedure(s) with comments: UPPER ENDOSCOPIC ULTRASOUND (EUS) RADIAL (N/A) - 90 min spot ESOPHAGOGASTRODUODENOSCOPY (EGD) WITH PROPOFOL (N/A) as a surgical intervention .  The patient's history has been reviewed, patient examined, no change in status, stable for surgery.  I have reviewed the patient's chart and labs.  Questions were answered to the patient's satisfaction.     [employer]

## 2017-12-30 NOTE — Transfer of Care (Signed)
Immediate Anesthesia Transfer of Care Note  Patient: Paul [name]  Procedure(s) Performed: ESOPHAGOGASTRODUODENOSCOPY (EGD) WITH PROPOFOL Necrosectomy (N/A )  Patient Location: Endoscopy Unit  Anesthesia Type:General  Level of Consciousness: awake, alert  and oriented  Airway & Oxygen Therapy: Patient Spontanous Breathing  Post-op Assessment: Post -op Vital signs reviewed and stable  Post vital signs: stable  Last Vitals:  Vitals Value Taken Time  BP    Temp    Pulse    Resp    SpO2      Last Pain:  Vitals:   12/30/17 1052  TempSrc: Oral  PainSc: 0-No pain         Complications: No apparent anesthesia complications

## 2017-12-30 NOTE — Discharge Summary (Signed)
Physician Discharge Summary  Paul [name] FBP:[id number] DOB: [birth year] DOA: 12/08/2017  PCP: Karleen Hampshire., MD  Admit date: 12/08/2017 Discharge date: 12/17/2017  Time spent: 35 minutes  Recommendations for Outpatient Follow-up:  1. GI DR..Mansouraty on 10/11 2. Radiology -drain clinic in 1 week 3. ID Dr.Hatcher will FU FLuid culture and change Abx if needed   Discharge Diagnoses:  Principal Problem:   Sepsis [facility])   Pancreatic pseudocyst   Essential hypertension   Necrotizing pancreatitis   Hyperglycemia   Pancreatic abscess   Pancreatic necrosis   Discharge Condition: stable  Diet recommendation: low fat  Filed Weights   12/09/17 0302 12/15/17 0500  Weight: 77.1 kg 75.7 kg    History of present illness:  Paul [name] a 37 y.o.malewithhistory of renal cell carcinoma status post left-sided nephrectomy in 2012 who was admitted for idiopathic pancreatitis last month and had necrotizing pancreatitis eventually discharged home on IV antibiotics last dose was on December 02, 2017 last week presents to the ER with complaints of fever and chills for last 2 days. In the ER patient had a fever of 102 F CT of the abdomen shows features of necrotizing pancreatitis with no definite abscess.  Hospital Course:  1-Sepsis due to infected pseudocyst complicating necrotizing pancreatitis -GI, infectious disease consulted -just recently completed a three-week course of IV meropenem as outpatient 1 week prior to admission - 9/13 underwent unsuccessful attempt at EUS guided cystogastrostomy -IR consulted, underwent percutaneous drain into the pancreatic pseudocyst, fluid culture with moderate gram-positive cocci, final cultures pending, reincubated -ID consulted -Clinically improved on IV vancomycin and meropenem -Discharged home on Oral augmentin per DR.Hatchers recommendations, -he will FU final cultures -Close follow-up with GI on 10/11, and interventional radiology in 1-2 weeks at the  drain clinic  2-Necrotizing pancreatitis;  -Improving, as above  3-HTN;  -continue Norvasc, atenolol dose decreased due to bradycardia  4-Hyperglycemia;  -continue sliding scale insulin -last hba1c was 5.5 -but need to monitor for development of DM in seeting of pancreatic failure  History of nephrectomy for left-sided renal cell carcinoma in 2012.  Hypokalemia; replaced.   Anemia; no melena. Iron deficiency.  -due to chronic disease -stable now  Discharge Exam: Vitals:   12/17/17 0726 12/17/17 1546  BP: (!) 136/95 (!) 139/101  Pulse: 64 68  Resp: 14 15  Temp: 97.9 F (36.6 C) 98.4 F (36.9 C)  SpO2: 98% 98%    General: AAOx3 Cardiovascular: S1S2/RRR Respiratory: CTAB  Discharge Instructions   Discharge Instructions    Diet - low sodium heart healthy   Complete by:  As directed    Increase activity slowly   Complete by:  As directed      Allergies as of 12/17/2017      Reactions   Honey Diarrhea, Nausea And Vomiting      Medication List    STOP taking these medications   feeding supplement (PRO-STAT SUGAR FREE 64) Liqd   insulin glargine 100 unit/mL Sopn Commonly known as:  LANTUS     TAKE these medications   allopurinol 100 MG tablet Commonly known as:  ZYLOPRIM Take 100 mg by mouth 2 (two) times daily.   amLODipine 2.5 MG tablet Commonly known as:  NORVASC Take 2.5 mg by mouth at bedtime.   atenolol 50 MG tablet Commonly known as:  TENORMIN Take 0.5 tablets (25 mg total) by mouth daily. What changed:  how much to take   blood glucose meter kit and supplies Dispense based on patient and insurance  preference. Use up to four times daily as directed. (FOR ICD-10 E10.9, E11.9).   insulin aspart 100 UNIT/ML FlexPen Commonly known as:  NOVOLOG Before each meal 3 times a day, 140-199 - 2 units, 200-250 - 4 units, 251-299 - 6 units,  300-349 - 8 units,  350 or above 10 units. Insulin PEN if approved, provide syringes and needles if  needed. What changed:    how much to take  how to take this  when to take this  additional instructions   Insulin Pen Needle 31G X 5 MM Misc For insulin injection, please provide a month supply.   LEVEMIR FLEXTOUCH 100 UNIT/ML Pen Generic drug:  Insulin Detemir Inject 8 Units into the skin at bedtime. What changed:  how much to take      Allergies  Allergen Reactions  . Honey Diarrhea and Nausea And Vomiting  . Other Rash    EGGPLANT   Follow-up Information    Mansouraty, Telford Nab., MD Follow up on 01/08/2018.   Specialties:  Gastroenterology, Internal Medicine Why:  10:30 AM  Contact information: [place] [address] [phone]        Sandi Mariscal, MD Follow up in 2 week(s).   Specialties:  Interventional Radiology, Radiology Why:  pt will hear from scheduler for [facility] follow up date and time; call [phone] if questions Contact information: [place] [address] [phone]            The results of significant diagnostics from this hospitalization (including imaging, microbiology, ancillary and laboratory) are listed below for reference.    Significant Diagnostic Studies: Dg Chest 2 View  Result Date: 12/08/2017 CLINICAL DATA:  Pancreatitis EXAM: CHEST - 2 VIEW COMPARISON:  11/12/2017, CT 11/16/2017 FINDINGS: Low lung volumes with atelectasis at the right base. No consolidation or effusion. Cardiomediastinal silhouette within normal limits. No pneumothorax. IMPRESSION: No active cardiopulmonary disease. Low lung volumes with atelectasis at the right base. Electronically Signed   By: Donavan Foil M.D.   On: 12/08/2017 21:04   Ct Abdomen Pelvis W Contrast  Result Date: 12/23/2017 CLINICAL DATA:  37 year old male with say history of severe pancreatitis complicated by large pancreatic pseudocyst formation and a failed attempted cyst gastrostomy (distance between the gastric antrum and the pseudocyst  was greater than 1 cm which is the maximal length of the cyst gastrostomy stent prosthesis). Patient was developing persistent fevers following antibiotic therapy and therefore request was made for percutaneous drain placement which occurred on 12/13/2017. Patient presents today for follow-up evaluation of percutaneous drainage. Drain output is currently minimal and patient is asymptomatic other than minor discomfort at the drain entry site. EXAM: CT ABDOMEN AND PELVIS WITH CONTRAST TECHNIQUE: Multidetector CT imaging of the abdomen and pelvis was performed using the standard protocol following bolus administration of intravenous contrast. CONTRAST:  167m ISOVUE-300 IOPAMIDOL (ISOVUE-300) INJECTION 61% COMPARISON:  Prior CT scan of the abdomen and pelvis 12/08/2017 FINDINGS: Lower chest: The lung bases are clear. Visualized cardiac structures are within normal limits for size. No pericardial effusion. Unremarkable visualized distal thoracic esophagus. Hepatobiliary: Normal hepatic contour and morphology. No discrete hepatic lesions. Normal appearance of the gallbladder. No intra or extrahepatic biliary ductal dilatation. Pancreas: An elongated, lobular peripherally enhancing fluid and gas collection replaces nearly the entirety of the pancreatic parenchyma consistent with the patient's known large pancreatic pseudocyst in diffuse pancreatic necrosis. Overall, the pancreatic pseudocyst has decreased in size presently measuring approximately 9.8 x  6.9 x 3.7 cm (volume = 130 cm^3) compared to 12.1 x 12.3 x 9.3 cm (volume = 720 cm^3) previously. The drainage catheter remains well positioned within the fluid collection although it has slightly pulled back resulting in the most proximal several sideholes in the retroperitoneal fat rather than within the pseudocyst. Additionally, there now appears to be a fistulous communication between the proximal descending duodenum and the pancreatic pseudocyst in the region of the  pancreatic head. Spleen: Normal in size without focal abnormality. Adrenals/Urinary Tract: The left adrenal gland is not well seen and may be surgically absent or obscured by the pancreatic pseudocyst and associated inflammatory changes. The left kidney is surgically absent. The right adrenal gland and right kidney remain normal in appearance. No hydronephrosis or nephrolithiasis. Unremarkable ureters and bladder. Stomach/Bowel: The stomach is within normal limits. There is mild hyperenhancement of the duodenal mucosa in the D2 segment where it passes closely adjacent to the pseudocyst. As mentioned above, there is a short segment fistulous communication that appears to communicate between the proximal D2 segment of the descending duodenum and the adjacent pseudocyst in the pancreatic head. Secondary inflammatory changes are present in the left colon both at the splenic flexure and in the proximal descending colon. Vascular/Lymphatic: No significant vascular findings are present. No enlarged abdominal or pelvic lymph nodes. Reproductive: Prostate is unremarkable. Other: No abdominal wall hernia or abnormality. No abdominopelvic ascites. Musculoskeletal: No acute fracture or aggressive appearing lytic or blastic osseous lesion. IMPRESSION: 1. Persistent, but significantly decreased pancreatic pseudocyst compared to 12/08/2017. The residual pseudocyst is thick walled, peripherally enhancing and contains low attenuation material and gas with an estimated volume of approximately 130 mL. The drainage catheter remains well positioned, however drain output is scant. 2. New apparent fistulous communication between the proximal descending duodenum and the pseudocyst in the region of the pancreatic head. 3. Persistent secondary inflammatory changes of the colon in the region of the splenic flexure and proximal descending colon. 4. No new or acute abnormality identified. Electronically Signed   By: Jacqulynn Cadet M.D.   On:  12/23/2017 13:35   Ct Abdomen Pelvis W Contrast  Result Date: 12/08/2017 CLINICAL DATA:  Sepsis and fever.  Recent pancreatitis. EXAM: CT ABDOMEN AND PELVIS WITH CONTRAST TECHNIQUE: Multidetector CT imaging of the abdomen and pelvis was performed using the standard protocol following bolus administration of intravenous contrast. CONTRAST:  144m ISOVUE-300 IOPAMIDOL (ISOVUE-300) INJECTION 61% COMPARISON:  Most recent CT 11/16/2017 FINDINGS: Lower chest: Resolved left pleural effusion with mild residual atelectasis. Minimal right lung base atelectasis. Small paraesophageal node, unchanged. Hepatobiliary: No focal liver abnormality is seen. No gallstones, gallbladder wall thickening, or biliary dilatation. Pancreas: Again seen sequela of necrotizing pancreatitis with near complete fluid replacement of pancreatic parenchyma. No internal air. Scattered irregular foci of soft tissue attenuation within the pancreas for example image 31 and 35 series 3 likely represent residual pancreatic parenchyma. Surrounding peripancreatic inflammatory stranding persists but is improved in the interim. The previous lesser sac fluid collection now appears contiguous with the necrotic pancreatic parenchyma, and is not separately distinguished. Small amount of fluid insinuates superiorly in the lesser sac image 28 series 3 with slight increase from prior exam. Fluid in the right pericolic gutter has diminished but appears organizing and is lobular in appearance. No discrete extra pancreatic fluid collection. Spleen: Normal in size. Fluid or pace pancreatic tail abuts the splenic hilum. The splenic vein slightly narrowed but remains patent. Adrenals/Urinary Tract: Post left nephrectomy. Compensatory hypertrophy of the  right kidney. No right hydronephrosis. Urinary bladder is physiologically distended without wall thickening. Stomach/Bowel: Bowel evaluation is limited in the absence of enteric contrast. Mild gastric wall enhancement and  wall thickening likely reactive due to adjacent pancreatic inflammation. No evidence of bowel obstruction. Borderline wall thickening of the ascending colon is likely reactive. Air-filled appendix is prominent diameter proximally but without periappendiceal inflammation. Vascular/Lymphatic: Splenic vein, portal vein, and mesenteric vessels are patent. Normal caliber abdominal aorta. Multiple small retroperitoneal nodes are likely reactive and not enlarged by size criteria. Reproductive: Prostate is unremarkable. Other: No free air. Persistent soft tissue density in the right pericolic gutter with decreased fluid but increasing lobular soft tissue components. Musculoskeletal: Bone islands in the pelvis and proximal femora. There are no acute or suspicious osseous abnormalities. IMPRESSION: 1. Severe necrotizing pancreatitis. Persistent but slight decreased peripancreatic inflammation since CT 3 weeks ago. The previous lesser sac fluid collection is now contiguous with the necrotic pancreatic parenchyma and not separately distinguished. Slight increased fluid in the lesser sac but no new organizing fluid collection. 2. Decreasing fluid in the right pericolic gutter with residual soft tissue nodular densities, no internal air to suggest superimposed infection. 3. Persistent wall thickening of the ascending colon, likely reactive. 4. Resolved left pleural effusion. Electronically Signed   By: Keith Rake M.D.   On: 12/08/2017 23:11   Ir Catheter Tube Change  Result Date: 12/30/2017 CLINICAL DATA:  History of pancreatic pseudocyst, post percutaneous drainage catheter placement on 12/13/2017. Patient was seen in the interventional radiology drain Clinic on 12/23/2017 and plan of care was discussed with referring gastroenterologist, Dr. Rush Landmark. Patient underwent endoscopy earlier today with cannulization the pancreatic pseudocyst fistula to the descending portion of duodenum with subsequent internal  debridement. Request now made for fluoroscopic guided exchange, repositioning and up sizing of the percutaneous drainage catheter. EXAM: IR CATHETER TUBE CHANGE COMPARISON:  CT abdomen and pelvis - 12/23/2017; 12/08/2017; CT-guided percutaneous drainage catheter placement-12/13/2017 CONTRAST:  15 cc Isovue-300 - administered via the percutaneous drainage catheter. MEDICATIONS: None. ANESTHESIA/SEDATION: Moderate (conscious) sedation was employed during this procedure. A total of Versed 0.5 mg and Fentanyl 50 mcg was administered intravenously. Moderate Sedation Time: 12 minutes. The patient's level of consciousness and vital signs were monitored continuously by radiology nursing throughout the procedure under my direct supervision. FLUOROSCOPY TIME:  2 minutes, 12 seconds (19 mGy) FINDINGS: With the patient positioned left lateral decubitus on the fluoroscopy table, the external portion of the existing right trans gluteal approach percutaneous drainage catheter as well as the surrounding skin were prepped and draped in usual sterile fashion. A preprocedural spot fluoroscopic image was obtained of the upper abdomen existing percutaneous drainage catheter Multiple spot fluoroscopic images were obtained from the injection of a small contrast Next, the external portion of the drainage catheter was cut and drainage catheter was cannulated with a short Amplatz wire. Under intermittent fluoroscopic guidance, the drainage catheter was exchanged for a Kumpe catheter which was manipulated through the pancreatico-enteric fistula the level of duodenum. Contrast injection confirmed appropriate positioning. Next, over a Amplatz wire, the Kumpe catheter was exchanged for a new 37 [place] biliary drainage catheter with tip ultimately coiled and locked within the duodenum with multiple sideholes traversing near the entirety of the pancreatic pseudocyst. The percutaneous drainage catheter was secured in place within interrupted suture  and a StatLock device. The drain was connected to a gravity bag. A dressing was placed. The patient tolerated the procedure well without immediate postprocedural complication. IMPRESSION: Successful fluoroscopic,  repositioning and up sizing of new 74 [place] biliary drainage catheter with end coiled and locked within the duodenum and multiple side holes traversing near the entirety the pancreatic pseudocyst. PLAN: - The patient was instructed to flush the percutaneous drainage catheter with 10 cc of normal saline 4 times a day. - The patient was encouraged to maintain diligent records regarding drainage catheter output though was educated that output from drainage catheter may be variable as some debris/fluid may drain internally while other debris/fluid may draped externally to the gravity bag - patient will return to the interventional radiology drain clinic in approximately 2 weeks with repeat IV only CT scan of the abdomen. Electronically Signed   By: Sandi Mariscal M.D.   On: 12/30/2017 16:50   Ct Image Guided Drainage By Percutaneous Catheter  Result Date: 12/13/2017 INDICATION: History of pancreatic pseudocyst post failed attempted cyst-gastrostomy tube placement. Patient has been evaluated by the surgical service and request has been made for placement of a percutaneous drainage catheter for infection source control purposes. EXAM: CT IMAGE GUIDED DRAINAGE BY PERCUTANEOUS CATHETER COMPARISON:  CT abdomen and pelvis - 12/08/2017; 11/16/2017; 11/04/2017 MEDICATIONS: The patient is currently admitted to the hospital and receiving intravenous antibiotics. The antibiotics were administered within an appropriate time frame prior to the initiation of the procedure. ANESTHESIA/SEDATION: Moderate (conscious) sedation was employed during this procedure. A total of Versed 4 mg and Fentanyl 150 mcg was administered intravenously. Moderate Sedation Time: 22 minutes. The patient's level of consciousness and vital signs  were monitored continuously by radiology nursing throughout the procedure under my direct supervision. CONTRAST:  None COMPLICATIONS: None immediate. PROCEDURE: Informed written consent was obtained from the patient after a discussion of the risks, benefits and alternatives to treatment. The patient was placed supine, slightly RPO on the CT gantry and a pre procedural CT was performed re-demonstrating the known abscess/fluid collection within the pancreatic bed with dominant air and fluid containing pancreatic pseudocyst appearing slightly smaller since undergoing attempted cyst gastrostomy placement but with residual component measuring at least 10.9 x 4.0 cm (26, series 3). The procedure was planned. A timeout was performed prior to the initiation of the procedure. The skin overlying the left lateral abdomen was prepped and draped in the usual sterile fashion. The overlying soft tissues were anesthetized with 1% lidocaine with epinephrine. Appropriate trajectory was planned with the use of a 22 gauge spinal needle. An 18 gauge trocar needle was advanced into the abscess/fluid collection however the wire was noted to be coiled within the likely loculated lateral component of the collection. As such, the trocar needle was exchanged for a 15 cm Yueh sheath catheter which was utilized to manipulate the Amplatz wire to the medial aspect of the pancreatic pseudocyst. Appropriate position was confirmed with CT imaging. The tract was serially dilated allowing placement of a 10 [place] all-purpose multi side-hole biliary drainage catheter. Appropriate positioning was confirmed with a limited postprocedural CT scan. 25 ml of brown-colored purulent appearing fluid was aspirated. The tube was connected to a JP bulb and sutured in place. A dressing was placed. The patient tolerated the procedure well without immediate post procedural complication. IMPRESSION: Successful CT guided placement of a 10 [place] all purpose multi  side-hole biliary drain catheter into the pancreatic pseudocyst with aspiration of 25 cc of brown colored, purulent appearing fluid. Samples were sent to the laboratory as requested by the ordering clinical team. Electronically Signed   By: Sandi Mariscal M.D.   On: 12/13/2017 16:18  Ir Radiologist Eval & Mgmt  Result Date: 12/23/2017 Please refer to notes tab for details about interventional procedure. (Op Note)   Microbiology: No results found for this or any previous visit (from the past 240 hour(s)).   Labs: Basic Metabolic Panel: No results for input(s): NA, K, CL, CO2, GLUCOSE, BUN, CREATININE, CALCIUM, MG, PHOS in the last 168 hours. Liver Function Tests: No results for input(s): AST, ALT, ALKPHOS, BILITOT, PROT, ALBUMIN in the last 168 hours. No results for input(s): LIPASE, AMYLASE in the last 168 hours. No results for input(s): AMMONIA in the last 168 hours. CBC: No results for input(s): WBC, NEUTROABS, HGB, HCT, MCV, PLT in the last 168 hours. Cardiac Enzymes: No results for input(s): CKTOTAL, CKMB, CKMBINDEX, TROPONINI in the last 168 hours. BNP: BNP (last 3 results) No results for input(s): BNP in the last 8760 hours.  ProBNP (last 3 results) No results for input(s): PROBNP in the last 8760 hours.  CBG: Recent Labs  Lab 12/30/17 1110  GLUCAP 115*       Signed:  Domenic Polite MD.  [facility] 12/30/2017, 5:15 PM

## 2017-12-30 NOTE — Op Note (Signed)
[facility] Patient Name: Paul [name] Procedure Date : 12/30/2017 MRN: [id number] Attending MD: Justice Britain , MD Date of Birth: [birth year] CSN: [id number] Age: 37 Admit Type: Inpatient Procedure:                Upper GI endoscopy Indications:              Therapeutic procedure, Abnormal CT of the GI tract,                            Pancreatic necrosis, Personal history of digestive                            disease (unspecified), Fistulous connection between                            WON and Duodenum for assessment for possible                            Cystenterostomy stenting Providers:                Justice Britain, MD, Carlyn Reichert, RN Referring MD:              Medicines:                General Anesthesia, Cipro 086 mg IV Complications:            No immediate complications. Estimated Blood Loss:     Estimated blood loss was minimal. Procedure:                Pre-Anesthesia Assessment:                           - Prior to the procedure, a History and Physical                            was performed, and patient medications and                            allergies were reviewed. The patient's tolerance of                            previous anesthesia was also reviewed. The risks                            and benefits of the procedure and the sedation                            options and risks were discussed with the patient.                            All questions were answered, and informed consent                            was obtained. Prior Anticoagulants: The patient has  taken no previous anticoagulant or antiplatelet                            agents. ASA Grade Assessment: III - A patient with                            severe systemic disease. After reviewing the risks                            and benefits, the patient was deemed in                            satisfactory condition to undergo the procedure.                        After obtaining informed consent, the endoscope was                            passed under direct vision. Throughout the                            procedure, the patient's blood pressure, pulse, and                            oxygen saturations were monitored continuously. The                            [id number]-[id number] ([id number]) Olympus EGD Therapeutic was                            introduced through the mouth, and advanced to the                            third part of duodenum. The GIF-[id number] ([id number])                            Olympus Adult EGD was introduced through the mouth,                            and advanced to the third part of duodenum. The                            [id number]-[id number] ([id number]) Olympus ERCP was introduced                            through the mouth, and advanced to the second part                            of duodenum. The upper GI endoscopy was technically                            difficult and complex. Successful completion of the  procedure was aided by performing the maneuvers                            documented (below) in this report. The patient                            tolerated the procedure. Scope In: Scope Out: Findings:      No gross lesions were noted in the entire esophagus.      Patchy moderately erythematous mucosa was found in the gastric body and       in the gastric antrum.      No other gross lesions were noted in the entire examined stomach.      Diffuse severe inflammation characterized by congestion (edema),       erythema and granularity was found in the duodenal bulb and in the D1/D2       angle.      A 20 mm fistula (cystenterotomy) was found in the D1/D2 angle. Decision       was made not to pursue AXIOS stent placement due to size.Using the       therapeutic endoscope/adult endoscope/duodenoscope, the cyst was       partially filled with fluid and black necrotic tissue that was pasty  and       adherent to the cyst wall. Significant lavage was performed.       Necrosectomy was performed with a Raptor grasping device and Roth net,       requiring multiple intubations of the cyst. At the conclusion of the       procedure, a small amount of necrotic tissue and a medium amount of       pink, viable tissue was found within the cyst cavity on direct vision       (however, further tissue may be hiding further into the cavity since the       angle to get into position required significant torsion of the scope and       body to allow intubation.      The ampulla was normal. Impression:               - No gross lesions in esophagus.                           - Erythematous mucosa in the gastric body and                            antrum. No other gross lesions in the stomach.                           - Duodenopathy.                           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 Necrosectomy was performed.                           - Normal ampulla. Recommendation:           - The patient  will be observed post-procedure,                            until all discharge criteria are met.                           - Proceed with scheduled IR Drain exchange.                           - Continue Antibiotics for 10-more days (Rx to be                            sent to pharmacy).                           - Dependent on ability to place larger bore drain,                            hope is that we will be able in 2-weeks to allow                            continued flushing of drain and then repeat CT. If                            the cyst cavity is much smaller without significant                            amounts of necrosis, I would then advocate for                            removal of the drain and allow the patient to see                            how he does as it looks like the wall/cavity is                            well  appearing.                           - In the long-term, we would hope that with the                            drain eventually out and with the patient's                            auto-fistula, that he will be able to close off his                            fistula with time. We will have to see about how he                            does from a nutrition perspective to allow for  adequate healing. He is at risk for development of                            a cutaneous fistula, so we would want to get the                            drain out as soon as we can, but pulling now, may                            be risky to still have necrosis still in place.                           - The findings and recommendations were discussed                            with the patient.                           - The findings and recommendations were discussed                            with the patient's family.                           - Discuss with Dr. Pascal Lux from IR who will take                            patient for drain exchange today. Procedure Code(s):        --- Professional ---                           [phone], Esophagogastroduodenoscopy, flexible,                            transoral; diagnostic, including collection of                            specimen(s) by brushing or washing, when performed                            (separate procedure)                           [phone], Unlisted procedure, pancreas Diagnosis Code(s):        --- Professional ---                           K31.89, Other diseases of stomach and duodenum                           K29.80, Duodenitis without bleeding                           K31.6, Fistula of stomach and duodenum  K86.89, Other specified diseases of pancreas                           Z87.19, Personal history of other diseases of the                            digestive system                            R93.3, Abnormal findings on diagnostic imaging of                            other parts of digestive tract CPT copyright 2017 [employer]. All rights reserved. The codes documented in this report are preliminary and upon coder review may  be revised to meet current compliance requirements. Justice Britain, MD 12/30/2017 2:13:46 PM Number of Addenda: 0

## 2017-12-31 ENCOUNTER — Other Ambulatory Visit: Payer: Self-pay | Admitting: Gastroenterology

## 2017-12-31 DIAGNOSIS — K863 Pseudocyst of pancreas: Secondary | ICD-10-CM

## 2018-01-02 ENCOUNTER — Encounter (HOSPITAL_COMMUNITY): Payer: Self-pay | Admitting: Gastroenterology

## 2018-01-02 LAB — AEROBIC/ANAEROBIC CULTURE W GRAM STAIN (SURGICAL/DEEP WOUND)

## 2018-01-02 LAB — AEROBIC/ANAEROBIC CULTURE (SURGICAL/DEEP WOUND): SPECIAL REQUESTS: NORMAL

## 2018-01-05 ENCOUNTER — Other Ambulatory Visit: Payer: Self-pay | Admitting: Gastroenterology

## 2018-01-05 DIAGNOSIS — K8689 Other specified diseases of pancreas: Secondary | ICD-10-CM

## 2018-01-06 NOTE — Telephone Encounter (Signed)
Let's just plan for patient to come to clinic first.  I have placed some orders, but would rather he just come in so that we can determine next steps in plan after we talk and see him.   Let's make sure he doesn't drain his entire bulb before he comes to clinic because I may need some fluid to be obtained from it. Patty, do you know if we need to send a fluid sample from his drainage bulb, can we collect that at the Lab downstairs or while he is in clinic and send off for studies? Thanks.

## 2018-01-08 ENCOUNTER — Ambulatory Visit: Payer: 59 | Admitting: Gastroenterology

## 2018-01-08 ENCOUNTER — Encounter: Payer: Self-pay | Admitting: Gastroenterology

## 2018-01-08 ENCOUNTER — Other Ambulatory Visit (INDEPENDENT_AMBULATORY_CARE_PROVIDER_SITE_OTHER): Payer: 59

## 2018-01-08 VITALS — BP 116/74 | HR 85 | Ht 70.0 in | Wt 158.8 lb

## 2018-01-08 DIAGNOSIS — K8689 Other specified diseases of pancreas: Secondary | ICD-10-CM | POA: Diagnosis not present

## 2018-01-08 DIAGNOSIS — K8591 Acute pancreatitis with uninfected necrosis, unspecified: Secondary | ICD-10-CM | POA: Diagnosis not present

## 2018-01-08 DIAGNOSIS — K8502 Idiopathic acute pancreatitis with infected necrosis: Secondary | ICD-10-CM

## 2018-01-08 DIAGNOSIS — R935 Abnormal findings on diagnostic imaging of other abdominal regions, including retroperitoneum: Secondary | ICD-10-CM | POA: Diagnosis not present

## 2018-01-08 DIAGNOSIS — K316 Fistula of stomach and duodenum: Secondary | ICD-10-CM | POA: Diagnosis not present

## 2018-01-08 LAB — CBC WITH DIFFERENTIAL/PLATELET
Basophils Absolute: 0.1 10*3/uL (ref 0.0–0.1)
Basophils Relative: 0.7 % (ref 0.0–3.0)
EOS PCT: 2.3 % (ref 0.0–5.0)
Eosinophils Absolute: 0.2 10*3/uL (ref 0.0–0.7)
HEMATOCRIT: 37.9 % — AB (ref 39.0–52.0)
Hemoglobin: 12.3 g/dL — ABNORMAL LOW (ref 13.0–17.0)
LYMPHS ABS: 2.4 10*3/uL (ref 0.7–4.0)
LYMPHS PCT: 25.9 % (ref 12.0–46.0)
MCHC: 32.5 g/dL (ref 30.0–36.0)
MCV: 82.1 fl (ref 78.0–100.0)
MONOS PCT: 7.6 % (ref 3.0–12.0)
Monocytes Absolute: 0.7 10*3/uL (ref 0.1–1.0)
NEUTROS PCT: 63.5 % (ref 43.0–77.0)
Neutro Abs: 5.8 10*3/uL (ref 1.4–7.7)
Platelets: 452 10*3/uL — ABNORMAL HIGH (ref 150.0–400.0)
RBC: 4.61 Mil/uL (ref 4.22–5.81)
RDW: 14.6 % (ref 11.5–15.5)
WBC: 9.2 10*3/uL (ref 4.0–10.5)

## 2018-01-08 LAB — HIGH SENSITIVITY CRP: CRP HIGH SENSITIVITY: 17.15 mg/L — AB (ref 0.000–5.000)

## 2018-01-08 LAB — HEPATIC FUNCTION PANEL
ALBUMIN: 4.3 g/dL (ref 3.5–5.2)
ALK PHOS: 143 U/L — AB (ref 39–117)
ALT: 27 U/L (ref 0–53)
AST: 18 U/L (ref 0–37)
Bilirubin, Direct: 0.1 mg/dL (ref 0.0–0.3)
Total Bilirubin: 0.3 mg/dL (ref 0.2–1.2)
Total Protein: 8.5 g/dL — ABNORMAL HIGH (ref 6.0–8.3)

## 2018-01-08 LAB — LIPASE: Lipase: 55 U/L (ref 11.0–59.0)

## 2018-01-08 LAB — C-REACTIVE PROTEIN: CRP: 1.7 mg/dL (ref 0.5–20.0)

## 2018-01-08 MED FILL — NORMAL SALINE FLUSH SYRINGE: 0.9 | 15 days supply | Qty: 600 | Fill #0

## 2018-01-08 NOTE — Patient Instructions (Signed)
Your provider has requested that you go to the basement level for lab work before leaving today. Press "B" on the elevator. The lab is located at the first door on the left as you exit the elevator.  Thank you for entrusting me with your care and choosing [facility].  Dr Mansouraty  

## 2018-01-11 ENCOUNTER — Encounter: Payer: Self-pay | Admitting: Gastroenterology

## 2018-01-11 ENCOUNTER — Other Ambulatory Visit: Payer: Self-pay

## 2018-01-11 DIAGNOSIS — K8502 Idiopathic acute pancreatitis with infected necrosis: Secondary | ICD-10-CM

## 2018-01-11 DIAGNOSIS — K316 Fistula of stomach and duodenum: Secondary | ICD-10-CM | POA: Insufficient documentation

## 2018-01-11 DIAGNOSIS — R935 Abnormal findings on diagnostic imaging of other abdominal regions, including retroperitoneum: Secondary | ICD-10-CM | POA: Insufficient documentation

## 2018-01-11 NOTE — Progress Notes (Signed)
[place] VISIT   Primary Care Provider Karleen Hampshire., MD [address] [place] [address] [phone]  Referring Provider Karleen Hampshire., MD [address] [place], [place] [address] [phone]  Patient Profile: Paul [name] is a 37 y.o. male with a pmh significant for Severe Necrotizing Pancreatitis (Idiopathic v Drug-Induced) c/b DM, s/p Lt Nephrectomy for RCC, GERD, HLD, HTN, Migraines.  The patient presents to the [facility] for an evaluation and management of problem(s) noted below:  Problem List 1. Necrotizing pancreatitis   2. Idiopathic acute pancreatitis with infected necrosis   3. Duodenal fistula   4. Abnormal CT of the abdomen     History of Present Illness: This is the patient's first visit to the outpatient GI [place] clinic.  I met this patient back in August 2019 when he presented to the hospital with severe acute pancreatitis.  The patient ended up being hospitalized for 2 weeks and during his hospital stay he had imaging studies that suggested the development of necrotizing pancreatitis.  He required antibiotics and eventually was discharged home with IV antibiotics to complete a 3-week course.  Prior to his discharge he was found to have a large fluid collection peripancreatic wise.  With his hemodynamic and clinical stability as well as no fevers it recurred during his hospital stay he did well.  He completed his antibiotics as an outpatient was planned to be seen in follow-up in September.  However in September the patient developed recurrent fevers while he had been off antibiotics for a total of almost 7 days.  He was sent back to the hospital for readmission.  During that time an attempt at endoscopic cyst gastrostomy or cyst enterostomy was pursued.  At time of EUS there was not a great region for AXIOS/LAMS stenting and thus it was deferred.  The patient ended up getting a percutaneous drain  with cyst aspiration and he was started on antimicrobials based on sensitivities.  He was eventually discharged home.  On repeat imaging in September he was found to have a slowly decreasing size of fluid collection but still persistent large collection and there was concern for a new fistula that developed between the small bowel and the cyst cavity.  Decision was made to attempt endoscopy to further evaluate this possible fistula and proceed with stenting to try and aid with cyst drainage as well as a catheter enlargement.  At time of endoscopy as noted below a fistula was found at the D1-D2 angle just proximal to the ampulla with evidence of necrosis.  A necrosectomy was performed during that procedure with adequate visualization of what appeared to be healthy appearing cyst cavity wall.  The drainage catheter could not be visualized because of the angulation.  He then underwent a procedure right after our endoscopy where he had a upsized catheter placed into the cyst cavity and into the duodenum so that there could be adequate drainage and flushing.  He is now being seen for the first time since his procedures.  The patient denies any fevers or chills.  He otherwise has been feeling well.  After a few days of having his new drain in place he noticed increase in the amount of drainage that he was having from his drainage catheter.  He also noticed a change in the color over the course the last few days.  The initial drainage back showed much more black necrotic appearing material and subsequently is now  become more appearing of what he has been eating as well as some small foodstuffs that has been noted and a yellowish tinge to things but not true bile formation.  He is not having any diarrhea.  His weight is stabilized and has been slowly increasing.  He does note some mild early satiety but with a bit of time and waiting he is then able to continue eating.  He has had no fevers or chills.  He has no abdominal  pain.  GI Review of Systems Positive as above Negative for dysphasia, odynophagia, melena, hematochezia, bloating, jaundice  Review of Systems General: Denies fevers/chills/weight loss/night sweats HEENT: Denies oral lesions Cardiovascular: Denies chest pain Pulmonary: Denies shortness of breath Gastroenterological: See HPI Genitourinary: Denies darkened urine Hematological: Denies easy bruising Dermatological: Denies skin changes Psychological: Mood is stable Musculoskeletal: Denies new arthralgias   Medications Current Outpatient Medications  Medication Sig Dispense Refill  . allopurinol (ZYLOPRIM) 100 MG tablet Take 100 mg by mouth 2 (two) times daily.  1  . amLODipine (NORVASC) 2.5 MG tablet Take 2.5 mg by mouth at bedtime.   2  . atenolol (TENORMIN) 50 MG tablet Take 0.5 tablets (25 mg total) by mouth daily. (Patient taking differently: Take 50 mg by mouth daily. )    . blood glucose meter kit and supplies Dispense based on patient and insurance preference. Use up to four times daily as directed. (FOR ICD-10 E10.9, E11.9). 1 each 0  . fluconazole (DIFLUCAN) 100 MG tablet Take 1 tablet (100 mg total) by mouth daily. 30 tablet 0  . insulin aspart (NOVOLOG) 100 UNIT/ML FlexPen Before each meal 3 times a day, 140-199 - 2 units, 200-250 - 4 units, 251-299 - 6 units,  300-349 - 8 units,  350 or above 10 units. Insulin PEN if approved, provide syringes and needles if needed. (Patient taking differently: Inject 2-4 Units into the skin 3 (three) times daily before meals. ) 15 mL 0  . Insulin Pen Needle 31G X 5 MM MISC For insulin injection, please provide a month supply. 100 each 0  . LEVEMIR FLEXTOUCH 100 UNIT/ML Pen Inject 8 Units into the skin at bedtime. (Patient taking differently: Inject 15 Units into the skin at bedtime. )     No current facility-administered medications for this visit.     Allergies Allergies  Allergen Reactions  . Honey Diarrhea and Nausea And Vomiting  .  Other Rash    EGGPLANT    Histories Past Medical History:  Diagnosis Date  . Cancer of kidney [facility]) 2011   "left"  . Diabetes mellitus without complication ([place])   . Elevated blood uric acid level    "I take RX for it; Allopurinol" (11/04/2017)  . GERD (gastroesophageal reflux disease)   . High cholesterol   . Hypertension   . Migraine    "only in my teens" (11/04/2017)  . Pancreatitis   . Pneumonia    Past Surgical History:  Procedure Laterality Date  . ADRENALECTOMY     left side  . BIOPSY  12/11/2017   Procedure: BIOPSY;  Surgeon: Rush Landmark Telford Nab., MD;  Location: [place];  Service: Gastroenterology;;  . ESOPHAGOGASTRODUODENOSCOPY (EGD) WITH PROPOFOL N/A 12/11/2017   Procedure: ESOPHAGOGASTRODUODENOSCOPY (EGD) WITH PROPOFOL;  Surgeon: Irving Copas., MD;  Location: [place];  Service: Gastroenterology;  Laterality: N/A;  . ESOPHAGOGASTRODUODENOSCOPY (EGD) WITH PROPOFOL N/A 12/30/2017   Procedure: ESOPHAGOGASTRODUODENOSCOPY (EGD) WITH PROPOFOL Necrosectomy;  Surgeon: Rush Landmark Telford Nab., MD;  Location: [place];  Service:  Gastroenterology;  Laterality: N/A;  . IR CATHETER TUBE CHANGE  12/30/2017  . IR RADIOLOGIST EVAL & MGMT  12/23/2017  . NEPHRECTOMY Left 2011   "kidney cancer"  . UPPER ESOPHAGEAL ENDOSCOPIC ULTRASOUND (EUS) N/A 12/11/2017   Procedure: UPPER ESOPHAGEAL ENDOSCOPIC ULTRASOUND (EUS);  Surgeon: Irving Copas., MD;  Location: [place];  Service: Gastroenterology;  Laterality: N/A;  . UPPER GASTROINTESTINAL ENDOSCOPY  02/2017   Social History   Socioeconomic History  . Marital status: Married    Spouse name: Not on file  . Number of children: Not on file  . Years of education: Not on file  . Highest education level: Not on file  Occupational History  . Not on file  Social Needs  . Financial resource strain: Not on file  . Food insecurity:    Worry: Not on file    Inability: Not on file  . Transportation needs:     Medical: Not on file    Non-medical: Not on file  Tobacco Use  . Smoking status: Former Smoker    Packs/day: 0.50    Years: 11.00    Pack years: 5.50    Types: Cigarettes    Last attempt to quit: 2011    Years since quitting: 8.7  . Smokeless tobacco: Never Used  Substance and Sexual Activity  . Alcohol use: Not Currently    Alcohol/week: 4.0 standard drinks    Types: 4 Cans of beer per week  . Drug use: Never  . Sexual activity: Yes  Lifestyle  . Physical activity:    Days per week: Not on file    Minutes per session: Not on file  . Stress: Not on file  Relationships  . Social connections:    Talks on phone: Not on file    Gets together: Not on file    Attends religious service: Not on file    Active member of club or organization: Not on file    Attends meetings of clubs or organizations: Not on file    Relationship status: Not on file  . Intimate partner violence:    Fear of current or ex partner: Not on file    Emotionally abused: Not on file    Physically abused: Not on file    Forced sexual activity: Not on file  Other Topics Concern  . Not on file  Social History Narrative   Married, 1 child, wife expecting   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[employer]   Former smoker   4 beers/week   No drugs   Family History  Problem Relation Age of Onset  . Hypertension Mother   . Hypertension Father   . Pancreatitis Paternal Uncle        twice, was a drinker  . Colon cancer Neg Hx   . Esophageal cancer Neg Hx   . Inflammatory bowel disease Neg Hx   . Liver disease Neg Hx   . Pancreatic cancer Neg Hx   . Rectal cancer Neg Hx   . Stomach cancer Neg Hx    I have reviewed his medical, social, and family history in detail and updated the electronic medical record as necessary.    PHYSICAL EXAMINATION  BP 116/74   Pulse 85   Ht _0  (1.778 m)   Wt 158 lb 12.8 oz (72 kg)   BMI 22.79 kg/m  Wt Readings from Last 3 Encounters:  01/08/18 158 lb 12.8 oz (72 kg)  12/30/17  161 lb (73 kg)  12/23/17 160 lb (  72.6 kg)  GEN: NAD, appears stated age, doesn't appear chronically ill, accompanied by his wife PSYCH: Cooperative, without pressured speech EYE: Conjunctivae pink, sclerae anicteric ENT: MMM, without oral ulcers, no erythema or exudates noted NECK: Supple CV: RR without R/Gs  RESP: CTAB posteriorly GI: NABS, soft, left-sided drainage catheter in place (yellow fluid within the bag that looks dark orange in color with some debris), NT/ND, without rebound or guarding, no HSM appreciated MSK/EXT: No lower extremity edema SKIN: No jaundice NEURO:  Alert & Oriented x 3, no focal deficits   REVIEW OF DATA  I reviewed the following data at the time of this encounter:  GI Procedures and Studies  12/30/17 EGD - No gross lesions in esophagus. - Erythematous mucosa in the gastric body and antrum. No other gross lesions in the stomach. - Duodenopathy. - Duodenal fistula to pancreatic walled-off necrosis. Necrosectomy was performed. - Normal ampulla.  Laboratory Studies  Reviewed in epic  Imaging Studies  12/23/17 CTAP IMPRESSION: 1. Persistent, but significantly decreased pancreatic pseudocyst compared to 12/08/2017. The residual pseudocyst is thick walled, peripherally enhancing and contains low attenuation material and gas with an estimated volume of approximately 130 mL. The drainage catheter remains well positioned, however drain output is scant. 2. New apparent fistulous communication between the proximal descending duodenum and the pseudocyst in the region of the pancreatic head. 3. Persistent secondary inflammatory changes of the colon in the region of the splenic flexure and proximal descending colon. 4. No new or acute abnormality identified.  12/30/17 IR Procedure IMPRESSION: Successful fluoroscopic, repositioning and up sizing of new 45 French biliary drainage catheter with end coiled and locked within the duodenum and multiple side holes  traversing near the entirety the pancreatic pseudocyst.   ASSESSMENT  Mr. Narramore is a 37 y.o. male with a pmh significant for Severe Necrotizing Pancreatitis (Idiopathic v Drug-Induced) c/b DM, s/p Lt Nephrectomy for RCC, GERD, HLD, HTN, Migraines.   The patient is seen today for evaluation and management of:  1. Necrotizing pancreatitis   2. Idiopathic acute pancreatitis with infected necrosis   3. Duodenal fistula   4. Abnormal CT of the abdomen    This is a hemodynamically stable patient who has had severe necrotizing pancreatitis and complications of development of auto fistula development between pancreatic cyst and D1-D2 angle.  He has had a great attitude with everything that has gone on even though he has had such a severe bout of pancreatitis.  At this point in time he does look to be clinically well and is back at work and he is eating appropriately and maintaining if not gaining some weight.  I do not see other complications from the necrotizing pancreatitis since his necrosectomy and upsize drainage catheter placement.  I suspect that his upcoming radiology visit next week that his fluid collection should be less in size and with the large fistula that his body has created and as having removed some necrosis at time of last endoscopy that I suspect he may no longer require a drainage catheter.  The most significant drainage that he is having now I suspect is actually more from foodstuffs.  I do wonder however because of how significant this fistula is and how bad his pancreatitis is/was whether he may have developed a possible pancreatic leak as well.  We are unable today to have fluid studies sent because of our particular lab thus I will reach out to my radiology colleagues to see about potentially trying to send fluid  studies including amylase levels to see if there is evidence of a significant leak that could be causing problems for the patient and may require other intervention such as an  ERCP.  Hopeful that his chance of developing a cyst though cutaneous fistula from prior pancreatic cyst is low with the short period of time that we have had this drain in place but he is aware of our concerns that this could be something that may require drainage every so often.  If there is concern of a significant amount of drainage output then we may need to consider placement of double pigtail into the cyst fistula site in the duodenum try and maintain its patency and allow time for that to heal.  If the patient has persistent issues on his imaging study we will likely need him to see our surgical colleagues or if things continue to be an issue may require referral to a quaternary care facility.  He is managing his diabetes which is a result of his necrotizing pancreatitis.  We will send off a cystic fibrosis evaluation as there is some relationship between patients who have RCC and the development of pancreatitis with the etiology of his pancreatitis remains elusive could have been his initiation of a fenofibrate prior to his bout of pancreatitis a few weeks before although no case reports have been noted of this.  He may well be an idiopathic pancreatitis patient is well.  We are hopeful that he continues to have improvement as he seems to be clinically at this point in time.  Many questions were answered by the from the patient as well as from the patient's wife.  All patient questions were answered, to the best of my ability, and the patient agrees to the aforementioned plan of action with follow-up as indicated.   PLAN  1. Idiopathic acute pancreatitis with infected necrosis - [facility] Cystic Fib Exp Screen; Future - Fenofibrate related vs Idiopathic  2. Necrotizing pancreatitis - CBC with Differential/Platelet; Future - Lipase; Future - Hepatic function panel; Future - High sensitivity CRP; Future - Upcoming IR CT to review size of the WON/Peripancreatic fluid - Will send note to IR  colleagues to see if they would be able to send off fluid studies from his drain catheter at upcoming visit - If there continues to be a significant decrease in size of his peripancreatic WON/Peripancreatic fluid collection, I would suggest trying to remove the drainage catheter and close monitoring to be done with the patient to monitor for leak  3. Duodenal fistula - Dependent on volume and overall clinic status hopeful this will maintain tract patency to allow cyst drainage and not allow recurrence, if issues arise may need to place pigtails  4. Abnormal CT of the abdomen    Orders Placed This Encounter  Procedures  . CBC with Differential/Platelet  . Lipase  . Hepatic function panel  . High sensitivity CRP  . [facility] Cystic Fib Exp Screen    New Prescriptions   No medications on file   Modified Medications   No medications on file    Planned Follow Up: No follow-ups on file.   Justice Britain, MD [facility] Office # [phone]

## 2018-01-12 ENCOUNTER — Ambulatory Visit
Admission: RE | Admit: 2018-01-12 | Discharge: 2018-01-12 | Disposition: A | Discharge status: Home / Self Care | Payer: 59 | Source: Ambulatory Visit | Attending: Gastroenterology | Admitting: Gastroenterology

## 2018-01-12 ENCOUNTER — Other Ambulatory Visit
Admission: RE | Admit: 2018-01-12 | Discharge: 2018-01-12 | Disposition: A | Discharge status: Home / Self Care | Payer: 59 | Source: Ambulatory Visit | Attending: Gastroenterology | Admitting: Gastroenterology

## 2018-01-12 ENCOUNTER — Other Ambulatory Visit: Payer: Self-pay | Admitting: Gastroenterology

## 2018-01-12 ENCOUNTER — Encounter: Payer: Self-pay | Admitting: *Deleted

## 2018-01-12 DIAGNOSIS — K863 Pseudocyst of pancreas: Secondary | ICD-10-CM

## 2018-01-12 HISTORY — PX: IR RADIOLOGIST EVAL & MGMT: IMG5224

## 2018-01-12 MED ORDER — IOPAMIDOL (ISOVUE-300) INJECTION 61%
100.0000 mL | Freq: Once | INTRAVENOUS | Status: AC | PRN
  Administered 2018-01-12: 100 mL via INTRAVENOUS

## 2018-01-12 NOTE — Progress Notes (Signed)
Patient ID: Paul [name], male   DOB: 1980/08/01, 37 y.o.   MRN: [id number]       Chief Complaint:  Pancreatic pseudocyst drain follow-up  Referring Physician(s): Mansouraty,Gabriel Jr.  History of Present Illness: Paul [name] is a 37 y.o. male with a complicated severe pancreatitis requiring initial percutaneous drainage of a pseudocyst.  Percutaneous pseudocyst drain was exchanged for a cyst duodenoscopy.  Overall patient has significant output greater than 400 cc daily.  He continues to flush the drain catheter 3 times a day with 10 cc normal saline.  No interval fevers.  Tolerating a regular diet.  He is back to work.  Overall he feels much better.  He is here today for outpatient assessment and to review CT imaging.  Past Medical History:  Diagnosis Date  . Cancer of kidney [facility]) 2011   "left"  . Diabetes mellitus without complication ([place])   . Elevated blood uric acid level    "I take RX for it; Allopurinol" (11/04/2017)  . GERD (gastroesophageal reflux disease)   . High cholesterol   . Hypertension   . Migraine    "only in my teens" (11/04/2017)  . Pancreatitis   . Pneumonia     Past Surgical History:  Procedure Laterality Date  . ADRENALECTOMY     left side  . BIOPSY  12/11/2017   Procedure: BIOPSY;  Surgeon: Rush Landmark Telford Nab., MD;  Location: [place];  Service: Gastroenterology;;  . ESOPHAGOGASTRODUODENOSCOPY (EGD) WITH PROPOFOL N/A 12/11/2017   Procedure: ESOPHAGOGASTRODUODENOSCOPY (EGD) WITH PROPOFOL;  Surgeon: Irving Copas., MD;  Location: [place];  Service: Gastroenterology;  Laterality: N/A;  . ESOPHAGOGASTRODUODENOSCOPY (EGD) WITH PROPOFOL N/A 12/30/2017   Procedure: ESOPHAGOGASTRODUODENOSCOPY (EGD) WITH PROPOFOL Necrosectomy;  Surgeon: Rush Landmark Telford Nab., MD;  Location: [place];  Service: Gastroenterology;  Laterality: N/A;  . IR CATHETER TUBE CHANGE  12/30/2017  . IR RADIOLOGIST EVAL & MGMT  12/23/2017  . NEPHRECTOMY Left 2011   "kidney  cancer"  . UPPER ESOPHAGEAL ENDOSCOPIC ULTRASOUND (EUS) N/A 12/11/2017   Procedure: UPPER ESOPHAGEAL ENDOSCOPIC ULTRASOUND (EUS);  Surgeon: Irving Copas., MD;  Location: [place];  Service: Gastroenterology;  Laterality: N/A;  . UPPER GASTROINTESTINAL ENDOSCOPY  02/2017    Allergies: Honey and Other  Medications: Prior to Admission medications   Medication Sig Start Date End Date Taking? Authorizing Provider  allopurinol (ZYLOPRIM) 100 MG tablet Take 100 mg by mouth 2 (two) times daily. 10/07/17   [provider]  amLODipine (NORVASC) 2.5 MG tablet Take 2.5 mg by mouth at bedtime.  10/07/17   [provider]  atenolol (TENORMIN) 50 MG tablet Take 0.5 tablets (25 mg total) by mouth daily. Patient taking differently: Take 50 mg by mouth daily.  12/17/17   Domenic Polite, MD  blood glucose meter kit and supplies Dispense based on patient and insurance preference. Use up to four times daily as directed. (FOR ICD-10 E10.9, E11.9). 11/18/17   Florencia Reasons, MD  fluconazole (DIFLUCAN) 100 MG tablet Take 1 tablet (100 mg total) by mouth daily. 12/21/17 01/20/18  Campbell Riches, MD  insulin aspart (NOVOLOG) 100 UNIT/ML FlexPen Before each meal 3 times a day, 140-199 - 2 units, 200-250 - 4 units, 251-299 - 6 units,  300-349 - 8 units,  350 or above 10 units. Insulin PEN if approved, provide syringes and needles if needed. Patient taking differently: Inject 2-4 Units into the skin 3 (three) times daily before meals.  11/18/17   Florencia Reasons, MD  Insulin Pen Needle  31G X 5 MM MISC For insulin injection, please provide a month supply. 11/18/17   Florencia Reasons, MD  LEVEMIR FLEXTOUCH 100 UNIT/ML Pen Inject 8 Units into the skin at bedtime. Patient taking differently: Inject 15 Units into the skin at bedtime.  12/17/17   Domenic Polite, MD     Family History  Problem Relation Age of Onset  . Hypertension Mother   . Hypertension Father   . Pancreatitis Paternal Uncle        twice, was a  drinker  . Colon cancer Neg Hx   . Esophageal cancer Neg Hx   . Inflammatory bowel disease Neg Hx   . Liver disease Neg Hx   . Pancreatic cancer Neg Hx   . Rectal cancer Neg Hx   . Stomach cancer Neg Hx     Social History   Socioeconomic History  . Marital status: Married    Spouse name: Not on file  . Number of children: Not on file  . Years of education: Not on file  . Highest education level: Not on file  Occupational History  . Not on file  Social Needs  . Financial resource strain: Not on file  . Food insecurity:    Worry: Not on file    Inability: Not on file  . Transportation needs:    Medical: Not on file    Non-medical: Not on file  Tobacco Use  . Smoking status: Former Smoker    Packs/day: 0.50    Years: 11.00    Pack years: 5.50    Types: Cigarettes    Last attempt to quit: 2011    Years since quitting: 8.7  . Smokeless tobacco: Never Used  Substance and Sexual Activity  . Alcohol use: Not Currently    Alcohol/week: 4.0 standard drinks    Types: 4 Cans of beer per week  . Drug use: Never  . Sexual activity: Yes  Lifestyle  . Physical activity:    Days per week: Not on file    Minutes per session: Not on file  . Stress: Not on file  Relationships  . Social connections:    Talks on phone: Not on file    Gets together: Not on file    Attends religious service: Not on file    Active member of club or organization: Not on file    Attends meetings of clubs or organizations: Not on file    Relationship status: Not on file  Other Topics Concern  . 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  All other systems are negative.  Review of Systems  Vital Signs: BP 116/81   Pulse 73   Temp 98.2 F (36.8 C) (Oral)   Resp 15   Ht '5\' 10"'$   (1.778 m)   Wt 72.6 kg   SpO2 99%   BMI 22.96 kg/m   Physical Exam  Constitutional: He is oriented to person, place, and time. He appears well-developed and well-nourished. No distress.  Eyes: Conjunctivae are normal. No scleral icterus.  Abdominal: Soft. Bowel sounds are normal.  Left abdominal drain catheter site clean, dry and intact.  Musculoskeletal: He exhibits no edema.  Neurological: He is alert and oriented to person, place, and time.  Skin: He is not diaphoretic.  Psychiatric: He has a normal mood and affect.      Imaging: Ct Abdomen Pelvis W Contrast  Result Date: 01/12/2018 CLINICAL DATA:  Complicated subacute severe pancreatitis requiring percutaneous drainage. Drain catheter was converted to a cyst duodenostomy 12/30/2017. Outpatient follow-up imaging. EXAM: CT ABDOMEN AND PELVIS WITH CONTRAST TECHNIQUE: Multidetector CT imaging of the abdomen and pelvis was performed using the standard protocol following bolus administration of intravenous contrast. CONTRAST:  170m ISOVUE-300 IOPAMIDOL (ISOVUE-300) INJECTION 61% COMPARISON:  12/23/2017 FINDINGS: Lower chest: No acute abnormality. Hepatobiliary: Geographic hypoattenuation along the gallbladder fossa and falciform ligament compatible with patchy fatty infiltration. No other hepatic abnormality or biliary dilatation. Gallbladder is collapsed. Common bile duct nondilated. Hepatic and portal veins remain patent. Pancreas: Elongated complex peripherally enhancing peripancreatic air-fluid collection is smaller now measuring 7.4 x 3.2 cm, previously 9.8 x 3.7 cm. Percutaneous drain from a left upper quadrant approach extends through the pseudocyst into the duodenum compatible with a cyst duodenostomy. Continued improvement in the retroperitoneal edema/inflammatory process in the left upper quadrant extending along the left pericolic gutter. Similar pattern of diffuse pancreatic necrosis. No new fluid collections. No definite ductal  dilatation. 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 Spleen: Stable in size.  No new finding. Adrenals/Urinary Tract: Remote left nephrectomy. Stable nephrectomy site. Right adrenal gland and kidney are normal. No renal obstruction or hydronephrosis. Right ureter is nondilated. Bladder unremarkable. Stomach/Bowel: Negative for bowel obstruction, significant dilatation, ileus, or free air. Normal appearing appendix. No new collections or hemorrhage. Stable inflammatory changes throughout the mesentery and along both pericolic gutters extending inferiorly. Vascular/Lymphatic: Intact aorta. Mesenteric arterial vasculature remain patent. No aneurysm or occlusive disease. No adenopathy. Reproductive: No significant finding by CT Other: No inguinal or abdominal wall hernia Musculoskeletal: No acute osseous finding. IMPRESSION: Continued improvement and decrease in size of the complicated pancreatic pseudocyst. Stable position of the cyst duodenostomy percutaneous catheter. Persistent edema and inflammatory changes throughout the retroperitoneum, central mesentery, and both pericolic gutters as before. No new abdominal or pelvic fluid collections. No associated obstruction pattern. Electronically Signed   By: MJerilynn Mages  Jontrell Bushong M.D.   On: 01/12/2018 13:54   Ct Abdomen Pelvis W Contrast  Result Date: 12/23/2017 CLINICAL DATA:  37year old male with say history of severe pancreatitis complicated by large pancreatic pseudocyst formation and a failed attempted cyst gastrostomy (distance between the gastric antrum and the pseudocyst was greater than 1 cm which is the maximal length of the cyst gastrostomy stent prosthesis). Patient was developing persistent fevers following antibiotic therapy and therefore request was made for percutaneous drain placement which occurred on 12/13/2017. Patient presents today for  follow-up evaluation of percutaneous drainage. Drain output is currently minimal and patient is asymptomatic other than minor discomfort at the drain entry site. EXAM: CT ABDOMEN AND PELVIS WITH CONTRAST TECHNIQUE: Multidetector CT imaging of the abdomen and pelvis was performed using the standard protocol following bolus administration of intravenous contrast. CONTRAST:  1092mISOVUE-300 IOPAMIDOL (ISOVUE-300) INJECTION 61% COMPARISON:  Prior CT scan of the abdomen and pelvis 12/08/2017 FINDINGS: Lower chest: The lung bases are clear. Visualized cardiac structures are within normal limits for size. No pericardial effusion. Unremarkable visualized distal thoracic esophagus. Hepatobiliary: Normal hepatic contour and morphology. No discrete hepatic lesions. Normal appearance of the gallbladder. No intra or extrahepatic biliary ductal dilatation. Pancreas: An elongated, lobular peripherally enhancing fluid and gas collection replaces nearly the entirety of  the pancreatic parenchyma consistent with the patient's known large pancreatic pseudocyst in diffuse pancreatic necrosis. Overall, the pancreatic pseudocyst has decreased in size presently measuring approximately 9.8 x 6.9 x 3.7 cm (volume = 130 cm^3) compared to 12.1 x 12.3 x 9.3 cm (volume = 720 cm^3) previously. The drainage catheter remains well positioned within the fluid collection although it has slightly pulled back resulting in the most proximal several sideholes in the retroperitoneal fat rather than within the pseudocyst. Additionally, there now appears to be a fistulous communication between the proximal descending duodenum and the pancreatic pseudocyst in the region of the pancreatic head. Spleen: Normal in size without focal abnormality. Adrenals/Urinary Tract: The left adrenal gland is not well seen and may be surgically absent or obscured by the pancreatic pseudocyst and associated inflammatory changes. The left kidney is surgically absent. The right  adrenal gland and right kidney remain normal in appearance. No hydronephrosis or nephrolithiasis. Unremarkable ureters and bladder. Stomach/Bowel: The stomach is within normal limits. There is mild hyperenhancement of the duodenal mucosa in the D2 segment where it passes closely adjacent to the pseudocyst. As mentioned above, there is a short segment fistulous communication that appears to communicate between the proximal D2 segment of the descending duodenum and the adjacent pseudocyst in the pancreatic head. Secondary inflammatory changes are present in the left colon both at the splenic flexure and in the proximal descending colon. Vascular/Lymphatic: No significant vascular findings are present. No enlarged abdominal or pelvic lymph nodes. Reproductive: Prostate is unremarkable. Other: No abdominal wall hernia or abnormality. No abdominopelvic ascites. Musculoskeletal: No acute fracture or aggressive appearing lytic or blastic osseous lesion. IMPRESSION: 1. Persistent, but significantly decreased pancreatic pseudocyst compared to 12/08/2017. The residual pseudocyst is thick walled, peripherally enhancing and contains low attenuation material and gas with an estimated volume of approximately 130 mL. The drainage catheter remains well positioned, however drain output is scant. 2. New apparent fistulous communication between the proximal descending duodenum and the pseudocyst in the region of the pancreatic head. 3. Persistent secondary inflammatory changes of the colon in the region of the splenic flexure and proximal descending colon. 4. No new or acute abnormality identified. Electronically Signed   By: Jacqulynn Cadet M.D.   On: 12/23/2017 13:35   Ir Catheter Tube Change  Result Date: 12/30/2017 CLINICAL DATA:  History of pancreatic pseudocyst, post percutaneous drainage catheter placement on 12/13/2017. Patient was seen in the interventional radiology drain Clinic on 12/23/2017 and plan of care was  discussed with referring gastroenterologist, Dr. Rush Landmark. Patient underwent endoscopy earlier today with cannulization the pancreatic pseudocyst fistula to the descending portion of duodenum with subsequent internal debridement. Request now made for fluoroscopic guided exchange, repositioning and up sizing of the percutaneous drainage catheter. EXAM: IR CATHETER TUBE CHANGE COMPARISON:  CT abdomen and pelvis - 12/23/2017; 12/08/2017; CT-guided percutaneous drainage catheter placement-12/13/2017 CONTRAST:  15 cc Isovue-300 - administered via the percutaneous drainage catheter. MEDICATIONS: None. ANESTHESIA/SEDATION: Moderate (conscious) sedation was employed during this procedure. A total of Versed 0.5 mg and Fentanyl 50 mcg was administered intravenously. Moderate Sedation Time: 12 minutes. The patient's level of consciousness and vital signs were monitored continuously by radiology nursing throughout the procedure under my direct supervision. FLUOROSCOPY TIME:  2 minutes, 12 seconds (19 mGy) FINDINGS: With the patient positioned left lateral decubitus on the fluoroscopy table, the external portion of the existing right trans gluteal approach percutaneous drainage catheter as well as the surrounding skin were prepped and draped in usual sterile fashion.  A preprocedural spot fluoroscopic image was obtained of the upper abdomen existing percutaneous drainage catheter Multiple spot fluoroscopic images were obtained from the injection of a small contrast Next, the external portion of the drainage catheter was cut and drainage catheter was cannulated with a short Amplatz wire. Under intermittent fluoroscopic guidance, the drainage catheter was exchanged for a Kumpe catheter which was manipulated through the pancreatico-enteric fistula the level of duodenum. Contrast injection confirmed appropriate positioning. Next, over a Amplatz wire, the Kumpe catheter was exchanged for a new 66 [place] biliary drainage catheter  with tip ultimately coiled and locked within the duodenum with multiple sideholes traversing near the entirety of the pancreatic pseudocyst. The percutaneous drainage catheter was secured in place within interrupted suture and a StatLock device. The drain was connected to a gravity bag. A dressing was placed. The patient tolerated the procedure well without immediate postprocedural complication. IMPRESSION: Successful fluoroscopic, repositioning and up sizing of new 19 [place] biliary drainage catheter with end coiled and locked within the duodenum and multiple side holes traversing near the entirety the pancreatic pseudocyst. PLAN: - The patient was instructed to flush the percutaneous drainage catheter with 10 cc of normal saline 4 times a day. - The patient was encouraged to maintain diligent records regarding drainage catheter output though was educated that output from drainage catheter may be variable as some debris/fluid may drain internally while other debris/fluid may draped externally to the gravity bag - patient will return to the interventional radiology drain clinic in approximately 2 weeks with repeat IV only CT scan of the abdomen. Electronically Signed   By: Sandi Mariscal M.D.   On: 12/30/2017 16:50   Ir Radiologist Eval & Mgmt  Result Date: 12/23/2017 Please refer to notes tab for details about interventional procedure. (Op Note)   Labs:  CBC: Recent Labs    12/14/17 0832 12/15/17 0753 12/16/17 0315 01/08/18 1140  WBC 6.7 8.0 7.1 9.2  HGB 11.4* 10.7* 11.3* 12.3*  HCT 34.9* 33.0* 35.2* 37.9*  PLT 303 334 355 452.0*    COAGS: Recent Labs    11/13/17 0414 11/17/17 0803 11/24/17 1253 12/09/17 0313  INR 1.07 1.07 1.2* 1.37  APTT  --   --   --  35    BMP: Recent Labs    12/12/17 0850 12/13/17 0833 12/14/17 0832 12/16/17 0315  NA 135 139 137 137  K 4.1 3.5 3.9 3.6  CL 102 103 103 102  CO2 '22 23 25 23  '$ GLUCOSE 138* 146* 150* 148*  BUN 5* <5* 5* 6  CALCIUM 8.6* 8.8*  9.0 9.0  CREATININE 1.03 0.90 0.97 0.89  GFRNONAA >60 >60 >60 >60  GFRAA >60 >60 >60 >60    LIVER FUNCTION TESTS: Recent Labs    12/10/17 0618 12/13/17 0833 12/14/17 0832 01/08/18 1140  BILITOT 1.3* 0.7 0.7 0.3  AST '26 28 27 18  '$ ALT 31 38 37 27  ALKPHOS 88 99 103 143*  PROT 5.6* 6.3* 6.9 8.5*  ALBUMIN 2.4* 2.8* 3.0* 4.3    TUMOR MARKERS: No results for input(s): AFPTM, CEA, CA199, CHROMGRNA in the last 8760 hours.  Assessment and Plan:  Continued improvement in the complicated subacute pancreatitis status post conversion of the percutaneous pseudocyst drain to a percutaneous cyst duodenoscopy catheter.  Dominant pancreatic fluid collection is smaller.  Drain catheter position is stable.  No new collections.  Clinically he is doing very well.  Plan: External cap Trial.  Decreased flushing to 5 cc normal saline twice  daily.  Repeat outpatient  CT abdomen IV contrast only in 2 weeks.  He was instructed if he develops abdominal pain, nausea, vomiting, or fevers while capped, to reconnect the drain catheter to external gravity drainage and keep his follow-up in 2 weeks.     Electronically Signed: Greggory Keen 01/12/2018, 2:36 PM   I spent a total of    25 Minutes in face to face in clinical consultation, greater than 50% of which was counseling/coordinating care for this patient with a pancreatic pseudocyst drain as above

## 2018-01-20 LAB — CFVANTAGE CYSTIC FIB EXP SCREEN

## 2018-01-26 ENCOUNTER — Encounter: Payer: Self-pay | Admitting: Radiology

## 2018-01-26 ENCOUNTER — Ambulatory Visit
Admission: RE | Admit: 2018-01-26 | Discharge: 2018-01-26 | Disposition: A | Discharge status: Home / Self Care | Payer: 59 | Source: Ambulatory Visit | Attending: Gastroenterology | Admitting: Gastroenterology

## 2018-01-26 DIAGNOSIS — K863 Pseudocyst of pancreas: Secondary | ICD-10-CM

## 2018-01-26 HISTORY — PX: IR RADIOLOGIST EVAL & MGMT: IMG5224

## 2018-01-26 MED ORDER — IOPAMIDOL (ISOVUE-300) INJECTION 61%
100.0000 mL | Freq: Once | INTRAVENOUS | Status: AC | PRN
  Administered 2018-01-26: 100 mL via INTRAVENOUS

## 2018-01-26 NOTE — Progress Notes (Addendum)
Chief Complaint: Patient was seen in follow-up today for pancreatic pseudocyst at the request of Mansouraty,Gabriel Jr.  Referring Physician(s): Mansouraty,Gabriel Jr.  History of Present Illness: Paul [name] is a 37 y.o. male a complicated severe pancreatitis requiring initial percutaneous drainage of a pseudocyst.  Percutaneous pseudocyst drain was exchanged for a cystduodenostomy using a 70F biliary drain.   Patient's drain was capped at his last visit.  He is remained asymptomatic and continues to flush his drainage catheter daily.  He is currently doing very well and working full-time.  He denies fever, chills, abdominal pain, early satiety, nausea or vomiting.  He also denies coffee-ground emesis and hematemesis.  Past Medical History:  Diagnosis Date  . Cancer of kidney [facility]) 2011   "left"  . Diabetes mellitus without complication ([place])   . Elevated blood uric acid level    "I take RX for it; Allopurinol" (11/04/2017)  . GERD (gastroesophageal reflux disease)   . High cholesterol   . Hypertension   . Migraine    "only in my teens" (11/04/2017)  . Pancreatitis   . Pneumonia     Past Surgical History:  Procedure Laterality Date  . ADRENALECTOMY     left side  . BIOPSY  12/11/2017   Procedure: BIOPSY;  Surgeon: Rush Landmark Telford Nab., MD;  Location: [place];  Service: Gastroenterology;;  . ESOPHAGOGASTRODUODENOSCOPY (EGD) WITH PROPOFOL N/A 12/11/2017   Procedure: ESOPHAGOGASTRODUODENOSCOPY (EGD) WITH PROPOFOL;  Surgeon: Irving Copas., MD;  Location: [place];  Service: Gastroenterology;  Laterality: N/A;  . ESOPHAGOGASTRODUODENOSCOPY (EGD) WITH PROPOFOL N/A 12/30/2017   Procedure: ESOPHAGOGASTRODUODENOSCOPY (EGD) WITH PROPOFOL Necrosectomy;  Surgeon: Rush Landmark Telford Nab., MD;  Location: [place];  Service: Gastroenterology;  Laterality: N/A;  . IR CATHETER TUBE CHANGE  12/30/2017  . IR RADIOLOGIST EVAL & MGMT  12/23/2017  . IR RADIOLOGIST EVAL &  MGMT  01/12/2018  . IR RADIOLOGIST EVAL & MGMT  01/26/2018  . NEPHRECTOMY Left 2011   "kidney cancer"  . UPPER ESOPHAGEAL ENDOSCOPIC ULTRASOUND (EUS) N/A 12/11/2017   Procedure: UPPER ESOPHAGEAL ENDOSCOPIC ULTRASOUND (EUS);  Surgeon: Irving Copas., MD;  Location: [place];  Service: Gastroenterology;  Laterality: N/A;  . UPPER GASTROINTESTINAL ENDOSCOPY  02/2017    Allergies: Honey and Other  Medications: Prior to Admission medications   Medication Sig Start Date End Date Taking? Authorizing Provider  allopurinol (ZYLOPRIM) 100 MG tablet Take 100 mg by mouth 2 (two) times daily. 10/07/17   [provider]  amLODipine (NORVASC) 2.5 MG tablet Take 2.5 mg by mouth at bedtime.  10/07/17   [provider]  atenolol (TENORMIN) 50 MG tablet Take 0.5 tablets (25 mg total) by mouth daily. Patient taking differently: Take 50 mg by mouth daily.  12/17/17   Domenic Polite, MD  blood glucose meter kit and supplies Dispense based on patient and insurance preference. Use up to four times daily as directed. (FOR ICD-10 E10.9, E11.9). 11/18/17   Florencia Reasons, MD  insulin aspart (NOVOLOG) 100 UNIT/ML FlexPen Before each meal 3 times a day, 140-199 - 2 units, 200-250 - 4 units, 251-299 - 6 units,  300-349 - 8 units,  350 or above 10 units. Insulin PEN if approved, provide syringes and needles if needed. Patient taking differently: Inject 2-4 Units into the skin 3 (three) times daily before meals.  11/18/17   Florencia Reasons, MD  Insulin Pen Needle 31G X 5 MM MISC For insulin injection, please provide a month supply. 11/18/17   Florencia Reasons, MD  LEVEMIR FLEXTOUCH 100 UNIT/ML Pen Inject 8 Units into the skin at bedtime. Patient taking differently: Inject 15 Units into the skin at bedtime.  12/17/17   Domenic Polite, MD     Family History  Problem Relation Age of Onset  . Hypertension Mother   . Hypertension Father   . Pancreatitis Paternal Uncle        twice, was a drinker  . Colon cancer Neg  Hx   . Esophageal cancer Neg Hx   . Inflammatory bowel disease Neg Hx   . Liver disease Neg Hx   . Pancreatic cancer Neg Hx   . Rectal cancer Neg Hx   . Stomach cancer Neg Hx     Social History   Socioeconomic History  . Marital status: Married    Spouse name: Not on file  . Number of children: Not on file  . Years of education: Not on file  . Highest education level: Not on file  Occupational History  . Not on file  Social Needs  . Financial resource strain: Not on file  . Food insecurity:    Worry: Not on file    Inability: Not on file  . Transportation needs:    Medical: Not on file    Non-medical: Not on file  Tobacco Use  . Smoking status: Former Smoker    Packs/day: 0.50    Years: 11.00    Pack years: 5.50    Types: Cigarettes    Last attempt to quit: 2011    Years since quitting: 8.8  . Smokeless tobacco: Never Used  Substance and Sexual Activity  . Alcohol use: Not Currently    Alcohol/week: 4.0 standard drinks    Types: 4 Cans of beer per week  . Drug use: Never  . Sexual activity: Yes  Lifestyle  . Physical activity:    Days per week: Not on file    Minutes per session: Not on file  . Stress: Not on file  Relationships  . Social connections:    Talks on phone: Not on file    Gets together: Not on file    Attends religious service: Not on file    Active member of club or organization: Not on file    Attends meetings of clubs or organizations: Not on file    Relationship status: Not on file  Other Topics Concern  . Not on file  Social History Narrative   Married, 1 child, wife expecting   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   Former smoker   4 beers/week   No drugs    Review of Systems: A 12 point ROS discussed and pertinent positives are indicated in the HPI above.  All other systems are negative.  Review of Systems  Vital Signs: BP 133/88   Pulse 61   Temp 98.7 F (37.1 C) (Oral)   Resp 15   Ht '5\' 10"'$  (1.778 m)   Wt 72.6 kg   SpO2  100%   BMI 22.96 kg/m   Physical Exam  Constitutional: He appears well-developed and well-nourished. No distress.  HENT:  Head: Normocephalic and atraumatic.  Eyes: No scleral icterus.  Cardiovascular: Normal rate.  Pulmonary/Chest: Effort normal.  Abdominal: Soft. He exhibits no distension. There is no tenderness.    Nursing note and vitals reviewed.    Imaging: Ct Abdomen Pelvis W Contrast  Result Date: 01/12/2018 CLINICAL DATA:  Complicated subacute severe pancreatitis requiring percutaneous drainage. Drain catheter was converted to a cyst duodenostomy 12/30/2017. Outpatient  follow-up imaging. EXAM: CT ABDOMEN AND PELVIS WITH CONTRAST TECHNIQUE: Multidetector CT imaging of the abdomen and pelvis was performed using the standard protocol following bolus administration of intravenous contrast. CONTRAST:  112m ISOVUE-300 IOPAMIDOL (ISOVUE-300) INJECTION 61% COMPARISON:  12/23/2017 FINDINGS: Lower chest: No acute abnormality. Hepatobiliary: Geographic hypoattenuation along the gallbladder fossa and falciform ligament compatible with patchy fatty infiltration. No other hepatic abnormality or biliary dilatation. Gallbladder is collapsed. Common bile duct nondilated. Hepatic and portal veins remain patent. Pancreas: Elongated complex peripherally enhancing peripancreatic air-fluid collection is smaller now measuring 7.4 x 3.2 cm, previously 9.8 x 3.7 cm. Percutaneous drain from a left upper quadrant approach extends through the pseudocyst into the duodenum compatible with a cyst duodenostomy. Continued improvement in the retroperitoneal edema/inflammatory process in the left upper quadrant extending along the left pericolic gutter. Similar pattern of diffuse pancreatic necrosis. No new fluid collections. No definite ductal dilatation. 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 Spleen:  Stable in size.  No new finding. Adrenals/Urinary Tract: Remote left nephrectomy. Stable nephrectomy site. Right adrenal gland and kidney are normal. No renal obstruction or hydronephrosis. Right ureter is nondilated. Bladder unremarkable. Stomach/Bowel: Negative for bowel obstruction, significant dilatation, ileus, or free air. Normal appearing appendix. No new collections or hemorrhage. Stable inflammatory changes throughout the mesentery and along both pericolic gutters extending inferiorly. Vascular/Lymphatic: Intact aorta. Mesenteric arterial vasculature remain patent. No aneurysm or occlusive disease. No adenopathy. Reproductive: No significant finding by CT Other: No inguinal or abdominal wall hernia Musculoskeletal: No acute osseous finding. IMPRESSION: Continued improvement and decrease in size of the complicated pancreatic pseudocyst. Stable position of the cyst duodenostomy percutaneous catheter. Persistent edema and inflammatory changes throughout the retroperitoneum, central mesentery, and both pericolic gutters as before. No new abdominal or pelvic fluid collections. No associated obstruction pattern. Electronically Signed   By: MJerilynn Mages  Shick M.D.   On: 01/12/2018 13:54   Ir Catheter Tube Change  Result Date: 12/30/2017 CLINICAL DATA:  History of pancreatic pseudocyst, post percutaneous drainage catheter placement on 12/13/2017. Patient was seen in the interventional radiology drain Clinic on 12/23/2017 and plan of care was discussed with referring gastroenterologist, Dr. MRush Landmark Patient underwent endoscopy earlier today with cannulization the pancreatic pseudocyst fistula to the descending portion of duodenum with subsequent internal debridement. Request now made for fluoroscopic guided exchange, repositioning and up sizing of the percutaneous drainage catheter. EXAM: IR CATHETER TUBE CHANGE COMPARISON:  CT abdomen and pelvis - 12/23/2017; 12/08/2017; CT-guided percutaneous drainage catheter  placement-12/13/2017 CONTRAST:  15 cc Isovue-300 - administered via the percutaneous drainage catheter. MEDICATIONS: None. ANESTHESIA/SEDATION: Moderate (conscious) sedation was employed during this procedure. A total of Versed 0.5 mg and Fentanyl 50 mcg was administered intravenously. Moderate Sedation Time: 12 minutes. The patient's level of consciousness and vital signs were monitored continuously by radiology nursing throughout the procedure under my direct supervision. FLUOROSCOPY TIME:  2 minutes, 12 seconds (19 mGy) FINDINGS: With the patient positioned left lateral decubitus on the fluoroscopy table, the external portion of the existing right trans gluteal approach percutaneous drainage catheter as well as the surrounding skin were prepped and draped in usual sterile fashion. A preprocedural spot fluoroscopic image was obtained of the upper abdomen existing percutaneous drainage catheter Multiple spot fluoroscopic images were obtained from the injection of a small contrast Next, the external portion of the drainage catheter was cut and drainage catheter was cannulated with a short Amplatz wire. Under intermittent fluoroscopic guidance,  the drainage catheter was exchanged for a Kumpe catheter which was manipulated through the pancreatico-enteric fistula the level of duodenum. Contrast injection confirmed appropriate positioning. Next, over a Amplatz wire, the Kumpe catheter was exchanged for a new 8 [place] biliary drainage catheter with tip ultimately coiled and locked within the duodenum with multiple sideholes traversing near the entirety of the pancreatic pseudocyst. The percutaneous drainage catheter was secured in place within interrupted suture and a StatLock device. The drain was connected to a gravity bag. A dressing was placed. The patient tolerated the procedure well without immediate postprocedural complication. IMPRESSION: Successful fluoroscopic, repositioning and up sizing of new 37 [place]  biliary drainage catheter with end coiled and locked within the duodenum and multiple side holes traversing near the entirety the pancreatic pseudocyst. PLAN: - The patient was instructed to flush the percutaneous drainage catheter with 10 cc of normal saline 4 times a day. - The patient was encouraged to maintain diligent records regarding drainage catheter output though was educated that output from drainage catheter may be variable as some debris/fluid may drain internally while other debris/fluid may draped externally to the gravity bag - patient will return to the interventional radiology drain clinic in approximately 2 weeks with repeat IV only CT scan of the abdomen. Electronically Signed   By: Sandi Mariscal M.D.   On: 12/30/2017 16:50   Ir Radiologist Eval & Mgmt  Result Date: 01/26/2018 Please refer to notes tab for details about interventional procedure. (Op Note)  Ir Radiologist Eval & Mgmt  Result Date: 01/12/2018 Please refer to notes tab for details about interventional procedure. (Op Note)   Labs:  CBC: Recent Labs    12/14/17 0832 12/15/17 0753 12/16/17 0315 01/08/18 1140  WBC 6.7 8.0 7.1 9.2  HGB 11.4* 10.7* 11.3* 12.3*  HCT 34.9* 33.0* 35.2* 37.9*  PLT 303 334 355 452.0*    COAGS: Recent Labs    11/13/17 0414 11/17/17 0803 11/24/17 1253 12/09/17 0313  INR 1.07 1.07 1.2* 1.37  APTT  --   --   --  35    BMP: Recent Labs    12/12/17 0850 12/13/17 0833 12/14/17 0832 12/16/17 0315  NA 135 139 137 137  K 4.1 3.5 3.9 3.6  CL 102 103 103 102  CO2 '22 23 25 23  '$ GLUCOSE 138* 146* 150* 148*  BUN 5* <5* 5* 6  CALCIUM 8.6* 8.8* 9.0 9.0  CREATININE 1.03 0.90 0.97 0.89  GFRNONAA >60 >60 >60 >60  GFRAA >60 >60 >60 >60    LIVER FUNCTION TESTS: Recent Labs    12/10/17 0618 12/13/17 0833 12/14/17 0832 01/08/18 1140  BILITOT 1.3* 0.7 0.7 0.3  AST '26 28 27 18  '$ ALT 31 38 37 27  ALKPHOS 88 99 103 143*  PROT 5.6* 6.3* 6.9 8.5*  ALBUMIN 2.4* 2.8* 3.0* 4.3      TUMOR MARKERS: No results for input(s): AFPTM, CEA, CA199, CHROMGRNA in the last 8760 hours.  Assessment and Plan:  Remarkably, Mr. Eggebrecht follow-up CT imaging today demonstrates significant improvement with near interval resolution of his previously noted pancreatic pseudocyst.  His drain has been capped for the past 2 weeks and he is doing well clinically.  At this time, I believe it is safe to proceed with removal of the percutaneous drain.  His drain was therefore removed without complication.    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  I explained this to the patient and warned him of the possibility of upper GI bleeding in the future which could manifest as coffee grounds emesis or vomiting of bright red blood.  He understands to report to the emergency room if he were to develop hematemesis.  Additionally, I spoke to his gastroenterologist, Dr. Rush Landmark, who agrees with the plan to remove the drainage catheter today.  Also, he is aware of the splenic and SMV stenosis and the risk for developing gastric varices in the future.  He is considering short-term anticoagulation as well as follow-up endoscopy.  At this time, there is no further scheduled follow-up with interventional radiology.  However, if this very nice gentleman develops recurrent infection, pseudocyst or symptomatic gastric varices in the future, we are always happy to see him again and offer our services.   Electronically Signed: Jacqulynn Cadet 01/26/2018, 12:49 PM   I spent a total of  15 Minutes in face to face in clinical consultation, greater than 50% of which was counseling/coordinating care for pancreatic pseudocyst.

## 2018-01-27 NOTE — Progress Notes (Signed)
Discussed case with Interventional Radiology. Appreciate their assistance with this patient and agree with drain removal. He will need close follow up and discussion as outpatient with repeat imaging in a few weeks as well to continue to monitor for signs/symptoms or development of progressive non-cirrhotic portal hypertension that could lead to varices or other issues, with hope that with progressive time away from pancreatitis that this does not occur. Patty, please work on having patient scheduled for a follow up in clinic with me in 3 weeks and we will determine repeat timing of radiology study. Thank you.

## 2018-02-18 ENCOUNTER — Ambulatory Visit (INDEPENDENT_AMBULATORY_CARE_PROVIDER_SITE_OTHER): Payer: 59 | Admitting: Gastroenterology

## 2018-02-18 ENCOUNTER — Other Ambulatory Visit (INDEPENDENT_AMBULATORY_CARE_PROVIDER_SITE_OTHER): Payer: 59

## 2018-02-18 ENCOUNTER — Encounter: Payer: Self-pay | Admitting: Gastroenterology

## 2018-02-18 VITALS — BP 120/78 | HR 48 | Ht 69.5 in | Wt 161.5 lb

## 2018-02-18 DIAGNOSIS — K316 Fistula of stomach and duodenum: Secondary | ICD-10-CM | POA: Diagnosis not present

## 2018-02-18 DIAGNOSIS — R748 Abnormal levels of other serum enzymes: Secondary | ICD-10-CM

## 2018-02-18 DIAGNOSIS — R935 Abnormal findings on diagnostic imaging of other abdominal regions, including retroperitoneum: Secondary | ICD-10-CM | POA: Diagnosis not present

## 2018-02-18 DIAGNOSIS — Z8719 Personal history of other diseases of the digestive system: Secondary | ICD-10-CM

## 2018-02-18 DIAGNOSIS — L659 Nonscarring hair loss, unspecified: Secondary | ICD-10-CM

## 2018-02-18 DIAGNOSIS — D649 Anemia, unspecified: Secondary | ICD-10-CM

## 2018-02-18 LAB — BASIC METABOLIC PANEL
BUN: 12 mg/dL (ref 6–23)
CHLORIDE: 102 meq/L (ref 96–112)
CO2: 25 mEq/L (ref 19–32)
Calcium: 10 mg/dL (ref 8.4–10.5)
Creatinine, Ser: 0.95 mg/dL (ref 0.40–1.50)
GFR: 94.95 mL/min (ref >60.00)
Glucose, Bld: 154 mg/dL — ABNORMAL HIGH (ref 70–99)
Potassium: 4.1 mEq/L (ref 3.5–5.1)
Sodium: 137 mEq/L (ref 135–145)

## 2018-02-18 LAB — B12 AND FOLATE PANEL
Folate: 14 ng/mL (ref >5.9)
VITAMIN B 12: 519 pg/mL (ref 211–911)

## 2018-02-18 LAB — HEPATIC FUNCTION PANEL
ALBUMIN: 4.7 g/dL (ref 3.5–5.2)
ALT: 19 U/L (ref 0–53)
AST: 20 U/L (ref 0–37)
Alkaline Phosphatase: 140 U/L — ABNORMAL HIGH (ref 39–117)
Bilirubin, Direct: 0.1 mg/dL (ref 0.0–0.3)
TOTAL PROTEIN: 8.5 g/dL — AB (ref 6.0–8.3)
Total Bilirubin: 0.5 mg/dL (ref 0.2–1.2)

## 2018-02-18 LAB — CBC
HEMATOCRIT: 40.4 % (ref 39.0–52.0)
HEMOGLOBIN: 13.2 g/dL (ref 13.0–17.0)
MCHC: 32.6 g/dL (ref 30.0–36.0)
MCV: 80.8 fl (ref 78.0–100.0)
PLATELETS: 305 10*3/uL (ref 150.0–400.0)
RBC: 5 Mil/uL (ref 4.22–5.81)
RDW: 16 % — ABNORMAL HIGH (ref 11.5–15.5)
WBC: 8.4 10*3/uL (ref 4.0–10.5)

## 2018-02-18 LAB — IBC PANEL
IRON: 51 ug/dL (ref 42–165)
Saturation Ratios: 14.2 % — ABNORMAL LOW (ref 20.0–50.0)
TRANSFERRIN: 257 mg/dL (ref 212.0–360.0)

## 2018-02-18 LAB — FERRITIN: Ferritin: 154.4 ng/mL (ref 22.0–322.0)

## 2018-02-18 NOTE — Patient Instructions (Signed)
You have been scheduled for a CT scan of the abdomen and pelvis at [facility] ([address]---this is in the same building as [facility]).   You are scheduled on 03/08/18 at 230pm. You should arrive 15 minutes prior to your appointment time for registration. Please follow the written instructions below on the day of your exam:  WARNING: IF YOU ARE ALLERGIC TO IODINE/X-RAY DYE, PLEASE NOTIFY RADIOLOGY IMMEDIATELY AT [phone]! YOU WILL BE GIVEN A 13 HOUR PREMEDICATION PREP.  g.     You may take any medications as prescribed with a small amount of water, if necessary. If you take any of the following medications: METFORMIN, GLUCOPHAGE, GLUCOVANCE, AVANDAMET, RIOMET, FORTAMET, ACTOPLUS MET, JANUMET, GLUMETZA or METAGLIP, you MAY be asked to HOLD this medication 48 hours AFTER the exam.  The purpose of you drinking the oral contrast is to aid in the visualization of your intestinal tract. The contrast solution may cause some diarrhea. Depending on your individual set of symptoms, you may also receive an intravenous injection of x-ray contrast/dye. Plan on being at [facility] for 30 minutes or longer, depending on the type of exam you are having performed.  This test typically takes 30-45 minutes to complete.  If you have any questions regarding your exam or if you need to reschedule, you may call the CT department at [phone] between the hours of 8:00 am and 5:00 pm, Monday-Friday.  Your provider has requested that you go to the basement level for lab work before leaving today. Press "B" on the elevator. The lab is located at the first door on the left as you exit the elevator.  Thank you for entrusting me with your care and choosing [facility].  Dr Mansouraty  ________________________________________________________________________    

## 2018-02-18 NOTE — Progress Notes (Addendum)
[place] VISIT   Primary Care Provider Karleen Hampshire., MD [facility] [address] [place] [address] [phone]  Patient Profile: Kalif Kattner is a 37 y.o. 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  The patient presents to the [facility] for an evaluation and management of problem(s) noted below:  Problem List 1. History of Severe Idiopathic Necrotizing Pancreatitis   2. Duodenal fistula   3. Abnormal CT of the abdomen   4. Elevated alkaline phosphatase level   5. Anemia, unspecified type   6. Hair loss disorder     History of Present Illness: In brief this is a patient that I met initially in August 2019 when he presented to the hospital with severe acute pancreatitis.  The patient ended up being hospitalized for 2 weeks and during his hospital stay he had imaging studies that suggested the development of necrotizing pancreatitis.  He required antibiotics and eventually was discharged home with IV antibiotics to complete a 3-week course.  Prior to his discharge he was found to have a large fluid collection peripancreatic wise.  With his hemodynamic and clinical stability as well as no fevers it recurred during his hospital stay he did well.  He completed his antibiotics as an outpatient was planned to be seen in follow-up in September.  However in September the patient developed recurrent fevers while he had been off antibiotics for a total of almost 7 days.  He was sent back to the hospital for readmission.  During that time an attempt at endoscopic cyst gastrostomy or cyst enterostomy was pursued.  At time of EUS there was not a great region for AXIOS/LAMS stenting and thus it was deferred.  The patient ended up getting a percutaneous drain with cyst aspiration and he was started on antimicrobials based on  sensitivities.  He was eventually discharged home.  On repeat imaging in September he was found to have a slowly decreasing size of fluid collection but still persistent large collection and there was concern for a new fistula that developed between the small bowel and the cyst cavity.  Decision was made to attempt endoscopy to further evaluate this possible fistula and proceed with stenting to try and aid with cyst drainage as well as a catheter enlargement.  At time of endoscopy as noted below a fistula was found at the D1-D2 angle just proximal to the ampulla with evidence of necrosis.  A necrosectomy was performed during that procedure with adequate visualization of what appeared to be healthy appearing cyst cavity wall.  The drainage catheter could not be visualized because of the angulation.  He then underwent a procedure right after our endoscopy where he had a upsized catheter placed into the cyst cavity and into the duodenum so that there could be adequate drainage and flushing.  He was seen in follow-up in clinic here with change in the coloration of his flushing output.  We felt that this is likely a result of Marylyn Ishihara and foodstuffs that he was eating rather than something more significant.  He underwent repeat imaging x2 and has had his drainage catheter now removed with significant improvement in the peripancreatic fluid collections.  He does have some findings on his imaging concerning for the development of potential collateralization as a result of stenosis within his arteriovenous supply as noted below.  Interval History Today, the patient  returns for planned follow-up.  We reviewed his CT scan results as noted below.  Overall, he has been doing well.  He is actually been gaining weight.  He is not having any evidence of a pancreatic O cutaneous fistula from previous IR drains.  He denies any significant abdominal pain.  He is having normal formed stools daily.  His blood sugars are still elevated  and he is working with his primary care doctor and being referred to an endocrinologist or potential management of his new onset diabetes since his pancreatitis.  The biggest thing for him having significant hair loss from his head over the course of just the last few months.  He has not been on any new medications.  He is hair products have been unchanged for many months.  In the past he had noted issues with some hair loss with changes in season however this is much more dramatic and he is very concerned about it.  His wife is expecting and he does not describe a decreased or increased sexual desire in regards to concern for testosterone changes.  GI Review of Systems Positive as above Negative for dysphagia, odynophagia, changes in bowel habits, melena, hematochezia, jaundice  Review of Systems General: Denies fevers/chills Cardiovascular: Denies chest pain Pulmonary: Denies shortness of breath Gastroenterological: See HPI Genitourinary: Denies darkened urine Dermatological: Skin changes as noted in regards to his hair but no other new rashes Psychological: Mood is stable about his pancreas but concerned about his hair loss   Medications Current Outpatient Medications  Medication Sig Dispense Refill  . allopurinol (ZYLOPRIM) 100 MG tablet Take 100 mg by mouth 2 (two) times daily.  1  . amLODipine (NORVASC) 2.5 MG tablet Take 2.5 mg by mouth at bedtime.   2  . atenolol (TENORMIN) 50 MG tablet Take 50 mg by mouth daily.    . blood glucose meter kit and supplies Dispense based on patient and insurance preference. Use up to four times daily as directed. (FOR ICD-10 E10.9, E11.9). 1 each 0  . insulin aspart (NOVOLOG) 100 UNIT/ML FlexPen Before each meal 3 times a day, 140-199 - 2 units, 200-250 - 4 units, 251-299 - 6 units,  300-349 - 8 units,  350 or above 10 units. Insulin PEN if approved, provide syringes and needles if needed. (Patient taking differently: Inject 2-4 Units into the skin 3  (three) times daily before meals. ) 15 mL 0  . Insulin Pen Needle 31G X 5 MM MISC For insulin injection, please provide a month supply. 100 each 0  . LEVEMIR FLEXTOUCH 100 UNIT/ML Pen Inject 8 Units into the skin at bedtime. (Patient taking differently: Inject 15 Units into the skin at bedtime. )     No current facility-administered medications for this visit.     Allergies Allergies  Allergen Reactions  . Honey Diarrhea and Nausea And Vomiting  . Other Rash    EGGPLANT    Histories Past Medical History:  Diagnosis Date  . Cancer of kidney [facility]) 2011   "left"  . Diabetes mellitus without complication ([place])   . Elevated blood uric acid level    "I take RX for it; Allopurinol" (11/04/2017)  . GERD (gastroesophageal reflux disease)   . High cholesterol   . Hypertension   . Migraine    "only in my teens" (11/04/2017)  . Pancreatitis   . Pneumonia    Past Surgical History:  Procedure Laterality Date  . ADRENALECTOMY     left side  .  BIOPSY  12/11/2017   Procedure: BIOPSY;  Surgeon: Rush Landmark Telford Nab., MD;  Location: [place];  Service: Gastroenterology;;  . ESOPHAGOGASTRODUODENOSCOPY (EGD) WITH PROPOFOL N/A 12/11/2017   Procedure: ESOPHAGOGASTRODUODENOSCOPY (EGD) WITH PROPOFOL;  Surgeon: Irving Copas., MD;  Location: [place];  Service: Gastroenterology;  Laterality: N/A;  . ESOPHAGOGASTRODUODENOSCOPY (EGD) WITH PROPOFOL N/A 12/30/2017   Procedure: ESOPHAGOGASTRODUODENOSCOPY (EGD) WITH PROPOFOL Necrosectomy;  Surgeon: Rush Landmark Telford Nab., MD;  Location: [place];  Service: Gastroenterology;  Laterality: N/A;  . IR CATHETER TUBE CHANGE  12/30/2017  . IR RADIOLOGIST EVAL & MGMT  12/23/2017  . IR RADIOLOGIST EVAL & MGMT  01/12/2018  . IR RADIOLOGIST EVAL & MGMT  01/26/2018  . NEPHRECTOMY Left 2011   "kidney cancer"  . UPPER ESOPHAGEAL ENDOSCOPIC ULTRASOUND (EUS) N/A 12/11/2017   Procedure: UPPER ESOPHAGEAL ENDOSCOPIC ULTRASOUND (EUS);  Surgeon: Irving Copas., MD;  Location: [place];  Service: Gastroenterology;  Laterality: N/A;  . UPPER GASTROINTESTINAL ENDOSCOPY  02/2017   Social History   Socioeconomic History  . Marital status: Married    Spouse name: Not on file  . Number of children: Not on file  . Years of education: Not on file  . Highest education level: Not on file  Occupational History  . Not on file  Social Needs  . Financial resource strain: Not on file  . Food insecurity:    Worry: Not on file    Inability: Not on file  . Transportation needs:    Medical: Not on file    Non-medical: Not on file  Tobacco Use  . Smoking status: Former Smoker    Packs/day: 0.50    Years: 11.00    Pack years: 5.50    Types: Cigarettes    Last attempt to quit: 2011    Years since quitting: 8.9  . Smokeless tobacco: Never Used  Substance and Sexual Activity  . Alcohol use: Not Currently    Alcohol/week: 4.0 standard drinks    Types: 4 Cans of beer per week  . Drug use: Never  . Sexual activity: Yes  Lifestyle  . Physical activity:    Days per week: Not on file    Minutes per session: Not on file  . Stress: Not on file  Relationships  . Social connections:    Talks on phone: Not on file    Gets together: Not on file    Attends religious service: Not on file    Active member of club or organization: Not on file    Attends meetings of clubs or organizations: Not on file    Relationship status: Not on file  . Intimate partner violence:    Fear of current or ex partner: Not on file    Emotionally abused: Not on file    Physically abused: Not on file    Forced sexual activity: Not on file  Other Topics Concern  . Not on file  Social History Narrative   Married, 1 child, wife expecting   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   Former smoker   4 beers/week   No drugs   Family History  Problem Relation Age of Onset  . Hypertension Mother   . Hypertension Father   . Pancreatitis Paternal Uncle        twice,  was a drinker  . Colon cancer Neg Hx   . Esophageal cancer Neg Hx   . Inflammatory bowel disease Neg Hx   . Liver disease Neg Hx   . Pancreatic  cancer Neg Hx   . Rectal cancer Neg Hx   . Stomach cancer Neg Hx    I have reviewed his medical, social, and family history in detail and updated the electronic medical record as necessary.    PHYSICAL EXAMINATION  BP 120/78 (BP Location: Left Arm, Patient Position: Sitting, Cuff Size: Normal)   Pulse (!) 48   Ht 5' 9.5" (1.765 m) Comment: height measured without shoes  Wt 161 lb 8 oz (73.3 kg)   BMI 23.51 kg/m  Wt Readings from Last 3 Encounters:  02/18/18 161 lb 8 oz (73.3 kg)  01/26/18 160 lb (72.6 kg)  01/12/18 160 lb (72.6 kg)  GEN: NAD, appears stated age, doesn't appear chronically ill, accompanied by his wife PSYCH: Cooperative, without pressured speech EYE: Conjunctivae pink, sclerae anicteric ENT: MMM, without oral ulcers NECK: Supple CV: RR without R/Gs  RESP: CTAB posteriorly GI: NABS, soft, nontender, surgical scars from prior nephrectomy present well-healed, left upper quadrant left flank IR drain catheter site well-healed, nontender, nondistended, without rebound or guarding, no hepatosplenomegaly appreciated MSK/EXT: No lower extremity edema SKIN: No jaundice, no evidence of any skin rashes throughout body, on head there is no evidence of psoriatic changes but placing fingers through hir did lead to hair loss NEURO:  Alert & Oriented x 3, no focal deficits   REVIEW OF DATA  I reviewed the following data at the time of this encounter:  GI Procedures and Studies  No new studies  Laboratory Studies  Reviewed in epic  Imaging Studies  01/26/18 CTAP IMPRESSION: 1. Near-total interval resolution of the previously identified pancreatic pseudocyst with only a small amount of residual fluid in the region the pancreatic tail and no further in intra fluid gas. 2. Severe external compression versus stenosis of the splenic  vein and superior mesenteric vein near the portal venous confluence. These changes are almost certainly secondary to the recent severe pancreatitis. There is evidence of developing portosystemic collateral formation from the short gastric veins through the draining gastric veins. This poses a risk for gastric varix formation in the future. 3. Additional ancillary findings as above without significant interval change.   ASSESSMENT  Mr. Grill is a 37 y.o. male with a pmh significant for Severe Necrotizing Pancreatitis (Idiopathic v Drug-Induced) c/b DM, s/p Lt Nephrectomy for RCC, GERD, HLD, HTN, Migraines.   The patient is seen today for evaluation and management of:  1. History of Severe Idiopathic Necrotizing Pancreatitis   2. Duodenal fistula   3. Abnormal CT of the abdomen   4. Elevated alkaline phosphatase level   5. Anemia, unspecified type   6. Hair loss disorder    This patient is doing well considering what he has gone through over the course of the last 4 months.  His etiology of pancreatitis remains elusive though the consideration of his fenofibrate which should been initiated a few weeks before his bout of pancreatitis though no case reports have been reported with something to be considered.  It has not been felt that biliary pathology was playing a role either based on his initial imaging and presentation and normal liver biochemical testing.  His course has been complicated by necrosis requiring interventional drainage as well as endoscopic necrosectomy in the setting of an auto fistula of the duodenum.  His most recent CT scan at the time of drainage catheter removal has shown significant improvement in the fluid collections.  However there is now an external compression for stenosis of the splenic vein  and superior mesenteric vein either near the portal venous confluence that is likely a result of the significant inflammatory process that he has had.  This is concerning because  there is the development of potential collaterals and the chance of developing gastric varices.  Although on initial endoscopic evaluation at time of necrosectomy we did not note gastric varices I do think that repeat endoscopic evaluation to evaluate for gastric varices is important.  I also think that repeat imaging with true angiography should be performed in the course of the next few months as he continues to improve from his bout of pancreatitis.  I would like to get that in the next month to month and a half before the end of the year.  I would also like to proceed with an endoscopy.  The risks and benefits of endoscopic evaluation were discussed with the patient; these include but are not limited to the risk of perforation, infection, bleeding, missed lesions, lack of diagnosis, severe illness requiring hospitalization, as well as anesthesia and sedation related illnesses.  The patient is agreeable to proceed.  We did discuss that I have concerns for him in the long-term should he actually developed gastric varices.  There is no role that we know of in the setting of extrinsic compression without thrombus for anticoagulation in the setting of the development of collaterals.  However I think the angiography will pick for [place] hopefully an improved area as he has had even further time from his significant inflammatory process.  The patient will continue a low-fat diet moving forward.  We also discussed minimization of all alcohol consumption as best as can to no more than social consumption every so often.  I do think that because of the severe bout of this pancreatitis that although we often reserve genetic testing for recurrent acute idiopathic pancreatitis think it would be helpful as he has children and has a child on the way to ensure that there are no genetic abnormalities that may predispose his family to for pancreatitis.  This in particular for Spink and PRSS.  We have already documented him not having  CFTR.  We also discussed today that we are thankful he is not showing evidence of exocrine insufficiency but his persistent need and glucose elevation in the setting of his insulin requirements do suggest that he may have the need for lifelong insulin moving forward.  Usually we see patients within the first 6 months after a severe bout of pancreatitis have the regain of as much of the pancreas as we can but we will have to wait and see and appreciate his endocrinology consultation coming forward.  He has had anemia and we will send off anemia labs today.  He has had significant hair loss of an unclear etiology.  Normally I would expect a telogen effluvium to occur at that time of his significant sickness and so I am not sure this is tied to his recent pancreatitis.  I will cc his PCP on this note.  All patient questions were answered, to the best of my ability, and the patient agrees to the aforementioned plan of action with follow-up as indicated.   PLAN  1. History of Severe Idiopathic Necrotizing Pancreatitis - CTM - Low-fat diet - Will work on trying to send [place] gene mutations (will be done due to specialty labs later)  2. Duodenal fistula  3. Abnormal CT of the abdomen - CT ANGIO ABDOMEN W &/OR WO CONTRAST; Future to help  clarify his arterial and venous supply - EGD to rule out Gastric varix development  4. Elevated alkaline phosphatase level - Hepatic function panel; Future  5. Anemia, unspecified type - IBC panel; Future - Ferritin; Future - B12 and Folate Panel; Future  6. Hair loss disorder - Further work-up as per PCP  Orders Placed This Encounter  Procedures  . CT ANGIO ABDOMEN W &/OR WO CONTRAST  . CBC  . Basic metabolic panel  . Hepatic function panel  . IBC panel  . Ferritin  . B12 and Folate Panel  . Ambulatory referral to Gastroenterology    New Prescriptions   No medications on file   Modified Medications   No medications on file    Planned  Follow Up: No follow-ups on file.   Justice Britain, MD [facility] Office # [phone]

## 2018-02-20 ENCOUNTER — Encounter: Payer: Self-pay | Admitting: Gastroenterology

## 2018-02-21 DIAGNOSIS — Z8719 Personal history of other diseases of the digestive system: Secondary | ICD-10-CM | POA: Insufficient documentation

## 2018-02-21 DIAGNOSIS — D649 Anemia, unspecified: Secondary | ICD-10-CM | POA: Insufficient documentation

## 2018-02-21 DIAGNOSIS — L659 Nonscarring hair loss, unspecified: Secondary | ICD-10-CM | POA: Insufficient documentation

## 2018-02-22 ENCOUNTER — Telehealth: Payer: Self-pay

## 2018-02-22 DIAGNOSIS — Z8719 Personal history of other diseases of the digestive system: Secondary | ICD-10-CM

## 2018-02-22 NOTE — Telephone Encounter (Signed)
The pt aware to come in for labs as requested.

## 2018-02-22 NOTE — Telephone Encounter (Signed)
Left message on machine to call back lab order in [facility]

## 2018-02-22 NOTE — Telephone Encounter (Signed)
-----   Message from Irving Copas., MD sent at 02/21/2018  8:54 AM EST ----- Regarding: Labs for patient Paul [name], I want to send some additional labs for this patient of unclear severe necrotizing pancreatitis: It is a [facility] lab but I couldn't find it:  Pancreatitis: Three-gene Profile (PRSS1, SPINK1, CFTR) (Full Gene Sequencing)  [email or web address]   However, Paul [name], we have already done the CFTR mutation previously.  I really just need PRSS1 and SPINK 1. I would like these 2 labs to be the only but I couldn't find them singly.  Please let me know if issues. Thanks.  Chester Holstein

## 2018-03-08 ENCOUNTER — Other Ambulatory Visit: Payer: 59

## 2018-03-15 ENCOUNTER — Telehealth: Payer: Self-pay

## 2018-03-15 NOTE — Telephone Encounter (Signed)
-----   Message from Irving Copas., MD sent at 03/15/2018  9:14 AM EST ----- Regarding: Update Dear Chong Sicilian, I hope you are well. I replied to the message from Mr. Kossman on [place]. Can you please try and reach the patient's PCP to let them know that his previous sliding scale is no longer working to keep Bgs under 200, and that he probably needs an earlier appointment with them to try and optimize things before his Endocrinology visit? If you are able later this morning or afternoon, it looks like he has an Endocrinology appointment upcoming in the system [facility] system), but maybe we can try and let them know that patient is looking for an earlier appointment. Thank you for the help as always Katelan Hirt. Chester Holstein

## 2018-03-15 NOTE — Telephone Encounter (Signed)
I have reached out to endocrinology Dr Tamala Julian at [facility] to get a sooner appt.  The pt was added to a wait list.  They will call him as soon as possible for an earlier appt.

## 2018-03-16 ENCOUNTER — Encounter: Payer: Self-pay | Admitting: Gastroenterology

## 2018-03-18 ENCOUNTER — Inpatient Hospital Stay: Admission: RE | Admit: 2018-03-18 | Payer: 59 | Source: Ambulatory Visit

## 2018-03-18 ENCOUNTER — Other Ambulatory Visit: Payer: 59

## 2018-03-18 DIAGNOSIS — Z8719 Personal history of other diseases of the digestive system: Secondary | ICD-10-CM

## 2018-03-18 NOTE — Addendum Note (Signed)
Addended by: Trenda Moots on: 18/56/3149 02:47 PM   Modules accepted: Orders

## 2018-03-23 ENCOUNTER — Ambulatory Visit (AMBULATORY_SURGERY_CENTER): Payer: 59 | Admitting: Gastroenterology

## 2018-03-23 ENCOUNTER — Encounter: Payer: 59 | Admitting: Gastroenterology

## 2018-03-23 ENCOUNTER — Encounter: Payer: Self-pay | Admitting: Gastroenterology

## 2018-03-23 VITALS — BP 115/64 | HR 47 | Temp 98.4°F | Resp 14 | Ht 69.5 in | Wt 161.0 lb

## 2018-03-23 DIAGNOSIS — K319 Disease of stomach and duodenum, unspecified: Secondary | ICD-10-CM

## 2018-03-23 DIAGNOSIS — Z8719 Personal history of other diseases of the digestive system: Secondary | ICD-10-CM | POA: Diagnosis not present

## 2018-03-23 DIAGNOSIS — K3189 Other diseases of stomach and duodenum: Secondary | ICD-10-CM

## 2018-03-23 DIAGNOSIS — K316 Fistula of stomach and duodenum: Secondary | ICD-10-CM | POA: Diagnosis not present

## 2018-03-23 MED ORDER — SODIUM CHLORIDE 0.9 % IV SOLN: 500.0000 mL | Freq: Once | INTRAVENOUS | Status: DC

## 2018-03-23 NOTE — Progress Notes (Signed)
Called to room to assist during endoscopic procedure.  Patient ID and intended procedure confirmed with present staff. Received instructions for my participation in the procedure from the performing physician.  

## 2018-03-23 NOTE — Addendum Note (Signed)
Addended by: Darron Doom on: 03/23/2018 10:59 AM   Modules accepted: Orders

## 2018-03-23 NOTE — Progress Notes (Signed)
Report given to PACU, vss 

## 2018-03-23 NOTE — Patient Instructions (Signed)
YOU HAD AN ENDOSCOPIC PROCEDURE TODAY AT THE [facility]:   Refer to the procedure report that was given to you for any specific questions about what was found during the examination.  If the procedure report does not answer your questions, please call your gastroenterologist to clarify.  If you requested that your care partner not be given the details of your procedure findings, then the procedure report has been included in a sealed envelope for you to review at your convenience later.  YOU SHOULD EXPECT: Some feelings of bloating in the abdomen. Passage of more gas than usual.  Walking can help get rid of the air that was put into your GI tract during the procedure and reduce the bloating. If you had a lower endoscopy (such as a colonoscopy or flexible sigmoidoscopy) you may notice spotting of blood in your stool or on the toilet paper. If you underwent a bowel prep for your procedure, you may not have a normal bowel movement for a few days.  Please Note:  You might notice some irritation and congestion in your nose or some drainage.  This is from the oxygen used during your procedure.  There is no need for concern and it should clear up in a day or so.  SYMPTOMS TO REPORT IMMEDIATELY:   Following upper endoscopy (EGD)  Vomiting of blood or coffee ground material  New chest pain or pain under the shoulder blades  Painful or persistently difficult swallowing  New shortness of breath  Fever of 100F or higher  Black, tarry-looking stools  For urgent or emergent issues, a gastroenterologist can be reached at any hour by calling [phone].   DIET:  We do recommend a small meal at first, but then you may proceed to your regular diet.  Drink plenty of fluids but you should avoid alcoholic beverages for 24 hours.  ACTIVITY:  You should plan to take it easy for the rest of today and you should NOT DRIVE or use heavy machinery until tomorrow (because of the sedation medicines used  during the test).    FOLLOW UP: Our staff will call the number listed on your records the next business day following your procedure to check on you and address any questions or concerns that you may have regarding the information given to you following your procedure. If we do not reach you, we will leave a message.  However, if you are feeling well and you are not experiencing any problems, there is no need to return our call.  We will assume that you have returned to your regular daily activities without incident.  If any biopsies were taken you will be contacted by phone or by letter within the next 1-3 weeks.  Please call us at [phone] if you have not heard about the biopsies in 3 weeks.    SIGNATURES/CONFIDENTIALITY: You and/or your care partner have signed paperwork which will be entered into your electronic medical record.  These signatures attest to the fact that that the information above on your After Visit Summary has been reviewed and is understood.  Full responsibility of the confidentiality of this discharge information lies with you and/or your care-partner. 

## 2018-03-23 NOTE — Op Note (Signed)
[facility] Patient Name: Paul [name] Procedure Date: 03/23/2018 8:38 AM MRN: [id number] Endoscopist: Justice Britain , MD Age: 37 Referring MD:  Date of Birth: 08-Feb-1981 Gender: Male Account #: [id number] Procedure:                Upper GI endoscopy Indications:              Abnormal CT of the GI tract, Pancreatic necrosis,                            Evaluation to ensure no evidence of Gastric Varices                            in setting of splenic vein compression Medicines:                Monitored Anesthesia Care Procedure:                Pre-Anesthesia Assessment:                           - Prior to the procedure, a History and Physical                            was performed, and patient medications and                            allergies were reviewed. The patient's tolerance of                            previous anesthesia was also reviewed. The risks                            and benefits of the procedure and the sedation                            options and risks were discussed with the patient.                            All questions were answered, and informed consent                            was obtained. Prior Anticoagulants: The patient has                            taken no previous anticoagulant or antiplatelet                            agents. ASA Grade Assessment: II - A patient with                            mild systemic disease. After reviewing the risks                            and benefits, the patient was deemed in  satisfactory condition to undergo the procedure.                           After obtaining informed consent, the endoscope was                            passed under direct vision. Throughout the                            procedure, the patient's blood pressure, pulse, and                            oxygen saturations were monitored continuously. The                            Endoscope was  introduced through the mouth, and                            advanced to the second part of duodenum. The upper                            GI endoscopy was accomplished without difficulty.                            The patient tolerated the procedure. Scope In: Scope Out: Findings:                 No gross lesions were noted in the entire esophagus.                           There is no endoscopic evidence of varices in the                            entire esophagus.                           The Z-line was regular and was found 40 cm from the                            incisors.                           Patchy mildly erythematous mucosa without bleeding                            was found in the gastric antrum.                           Otherwise, no gross lesions were noted in the                            entire examined stomach. Biopsies were taken with a                            cold forceps for histology and  Helicobacter pylori                            testing from the antrum/incisura/greater                            curve/lesser curve.                           No gross lesions were noted in the duodenal bulb.                           A mild extrinsic impression/deformity was found in                            the D1/D2 angle-sweep of the duodenum.                           Localized mildly erythematous mucosa without active                            bleeding and with no stigmata of bleeding was found                            in the D1/D2 angle-swepp of the duodenum.                           No gross lesions were noted in the second portion                            of the duodenum.                           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 Complications:            No immediate complications. Estimated Blood Loss:     Estimated blood loss was minimal. Impression:               - No gross lesions in esophagus. Z-line regular, 40                            cm from the incisors.                           - Erythematous mucosa in the antrum. Otherwise, no  gross lesions in the stomach. Biopsied.                           - No gross lesions in the duodenal bulb.                           - Duodenal extrinsic deformity/impression at the                            D1/D2 angle was found.                           - Erythematous duodenopathy in D1/D2 angle.                           - No other gross lesions in the second portion of                            the duodenum.                           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 Recommendation:           - The patient will be observed post-procedure,                            until all discharge criteria are met.                           - Discharge patient to home.                           - Patient has a contact number available for                            emergencies. The signs and symptoms of potential                            delayed complications were discussed with the                            patient. Return to normal activities tomorrow.                            Written discharge instructions were provided to the                            patient.                           - Resume previous diet.                           -  Continue present medications.                           - Await pathology results.                           - Will see what follow up CT scan shows that is                            pending completion this year.                           - The findings and recommendations were discussed                             with the patient.                           - The findings and recommendations were discussed                            with the patient's family. Justice Britain, MD 03/23/2018 9:16:38 AM

## 2018-03-25 ENCOUNTER — Telehealth: Payer: Self-pay | Admitting: *Deleted

## 2018-03-25 NOTE — Telephone Encounter (Signed)
  Follow up Call-  Call back number 03/23/2018  Post procedure Call Back phone  # [phone]  Permission to leave phone message Yes     Patient questions:  Do you have a fever, pain , or abdominal swelling? No. Pain Score  0 *  Have you tolerated food without any problems? Yes.    Have you been able to return to your normal activities? Yes.    Do you have any questions about your discharge instructions: Diet   No. Medications  No. Follow up visit  No.  Do you have questions or concerns about your Care? No.  Actions: * If pain score is 4 or above: No action needed, pain <4.

## 2018-03-26 ENCOUNTER — Ambulatory Visit (INDEPENDENT_AMBULATORY_CARE_PROVIDER_SITE_OTHER)
Admission: RE | Admit: 2018-03-26 | Discharge: 2018-03-26 | Disposition: A | Discharge status: Home / Self Care | Payer: 59 | Source: Ambulatory Visit | Attending: Gastroenterology | Admitting: Gastroenterology

## 2018-03-26 DIAGNOSIS — Z8719 Personal history of other diseases of the digestive system: Secondary | ICD-10-CM | POA: Diagnosis not present

## 2018-03-26 DIAGNOSIS — K316 Fistula of stomach and duodenum: Secondary | ICD-10-CM

## 2018-03-26 MED ORDER — IOPAMIDOL (ISOVUE-370) INJECTION 76%
100.0000 mL | Freq: Once | INTRAVENOUS | Status: AC | PRN
  Administered 2018-03-26: 100 mL via INTRAVENOUS

## 2018-04-01 NOTE — Telephone Encounter (Signed)
Attempted call back of patient on 04/01/2018.  This was around 5:20 PM. I left a message and will attempt to call him on 04/02/2018. If patient calls back before I reach him please ensure I get a number so that I can try and call him back. This is for documentation purposes.  Justice Britain, MD [facility] Office # [phone]

## 2018-04-03 ENCOUNTER — Encounter: Payer: Self-pay | Admitting: Gastroenterology

## 2018-04-05 LAB — PANCREATITIS: PRSS1

## 2018-04-05 LAB — PANCREATITIS: SPINK1

## 2018-04-08 ENCOUNTER — Encounter: Payer: Self-pay | Admitting: Gastroenterology

## 2018-04-08 NOTE — Progress Notes (Signed)
Review of outside [place] 1 mutation returned negative  We will have this scanned into the chart.

## 2018-04-15 ENCOUNTER — Telehealth: Payer: Self-pay | Admitting: Gastroenterology

## 2018-04-15 NOTE — Telephone Encounter (Signed)
The patient's case was discussed at the multidisciplinary conference on 04/14/2018. We had an opportunity to review the patient's history clinically as well as the imaging studies from the fall into the's winter 2019. Based on the 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  It is felt that there is no clot within the splenic system that would require anticoagulation as well discussion with hematology in the room as well and radiology review. 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 We will have to monitor the patient closely because of concern of abdominal varices having been developed. In regards to the patient's nodularities in the abdominal cavity after review of the earlier imaging and the review of imaging most recently the size of these lesions has decreased slightly and makes these more likely to be inflammatory driven scar changes rather than issues of oncologic variants. I discussed his history of renal cell carcinoma and the concern whether this could be anything of significance.  Based on the size having decreased it is felt these are more inflammatory and do require monitoring. I discussed the consideration of a oncology visit and they thought that would not be unreasonable and it would be offered to the patient. A genetics referral for consideration of further testing in the setting of early renal cell carcinoma was also considered and would be offered to the patient as well. We also discussed briefly about the patient's clinical presentation of idiopathic pancreatitis and whether cholecystectomy may be something to consider and the group felt that it may be something to consider though he only has evidence on one imaging study of biliary sludge and his  initial liver tests upon presentation were not abnormal.  I discussed all of this with the patient on 04/14/2018 in the p.m. He appreciates our call back and our discussions with the group. We will proceed with a follow-up CT abdomen pelvis with IV and oral contrast to be performed in a 81-month time.  From the last CT scan. He wants to hold on oncology referral as well as genetics referral. We discussed considering a repeat ultrasound of the liver if CT scan does not show evidence of biliary sludge or cholelithiasis and I think that is reasonable. I also offered the consideration of an endoscopic ultrasound to evaluate for microlithiasis and that may be considered in the future as well. He is doing well otherwise with stable weight and is in the process of seeing endocrinology for further titration of his insulin regimen. No evidence of exocrine insufficiency based on his stools at this point in time. We will work on arranging this follow-up and follow-up in clinic as well. He will alert [place] if anything else changes in the interim.  Justice Britain, MD [facility] Office # [phone]

## 2018-04-15 NOTE — Telephone Encounter (Signed)
appt cancelled; should be in about 3 months after CT and [place] as mentioned in Dr Rush Landmark note. We will contact the pt at that time to set up those appointments.

## 2018-04-15 NOTE — Telephone Encounter (Signed)
appt made with Dr Rush Landmark on 04/30/18 at 8:45 am.  Appt sent to the pt via My Chart

## 2018-04-30 ENCOUNTER — Ambulatory Visit: Payer: 59 | Admitting: Gastroenterology

## 2018-07-15 ENCOUNTER — Telehealth: Payer: Self-pay

## 2018-07-15 NOTE — Telephone Encounter (Signed)
Patty, Thank you for letting me know about this. This particular CT scan can be scheduled out 4 to 8 weeks from now. Thank you. GM

## 2018-07-15 NOTE — Telephone Encounter (Signed)
Message to call pt in 6-8 weeks to set up CT scan

## 2018-07-15 NOTE — Telephone Encounter (Signed)
Dr Rush Landmark this is a follow up CT scan can this wait a few weeks?

## 2018-07-15 NOTE — Telephone Encounter (Signed)
-----   Message from Timothy Lasso, RN sent at 04/15/2018  8:57 AM EST ----- Please schedule this patient a CT abdomen/pelvis with IV and oral contrast to be done at the end of March beginning of April (approximately 3 months from his last imaging study).  He also should have a right upper quadrant ultrasound performed at the same time for evaluation of possible biliary sludge which should be done around the same time.  As his CT scan.  Please schedule him a follow-up in clinic after both of these procedures have been completed.  Thank you.  The patient is already aware that we will be working on arranging this follow-up from an imaging perspective.

## 2018-09-02 ENCOUNTER — Telehealth: Payer: Self-pay

## 2018-09-02 DIAGNOSIS — Z8719 Personal history of other diseases of the digestive system: Secondary | ICD-10-CM

## 2018-09-02 NOTE — Telephone Encounter (Signed)
-----   Message from Timothy Lasso, RN sent at 07/15/2018  4:38 PM EDT ----- Chong Sicilian, Thank you for letting me know about this. This particular CT scan can be scheduled out 4 to 8 weeks from now. Thank you. GM   Documentation   You routed conversation to Mansouraty, Telford Nab., MD 1 hour ago (2:43 PM)  You 1 hour ago (2:43 PM)      Dr Rush Landmark this is a follow up CT scan can this wait a few weeks?   Documentation   You 7 hours ago (9:02 AM)      ----- Message from Timothy Lasso, RN sent at 04/15/2018  8:57 AM EST ----- Please schedule this patient a CT abdomen/pelvis with IV and oral contrast to be done at the end of March beginning of April (approximately 3 months from his last imaging study).  He also should have a right upper quadrant ultrasound performed at the same time for evaluation of possible biliary sludge which should be done around the same time.  As his CT scan.  Please schedule him a follow-up in clinic after both of these procedures have been completed.  Thank you.  The patient is already aware that we will be working on arranging this follow-up from an imaging perspective.      Documentation

## 2018-09-02 NOTE — Telephone Encounter (Signed)
Left message on machine to call back  

## 2018-09-03 NOTE — Telephone Encounter (Signed)
Order in [place] and message sent to have this scheduled at [place].  Per pt request information sent via My Chart.

## 2018-09-03 NOTE — Telephone Encounter (Signed)
You will need to pick up your contrast at our office at [place] 3rd floor and also have labs done in our basement lab.  No appointment is needed.  Please have this completed atleast 2-3 days prior to the appointment.   You have been scheduled for a CT scan of the abdomen and pelvis at [facility] ([address]---this is in the same building as [facility]).   You are scheduled on 09/22/18 at 4 pm. You should arrive 15 minutes prior to your appointment time for registration. Please follow the written instructions below on the day of your exam:  WARNING: IF YOU ARE ALLERGIC TO IODINE/X-RAY DYE, PLEASE NOTIFY RADIOLOGY IMMEDIATELY AT [phone]! YOU WILL BE GIVEN A 13 HOUR PREMEDICATION PREP.  1) Do not eat or drink anything after 12 noon (4 hours prior to your test) 2) You have been given 2 bottles of oral contrast to drink. The solution may taste better if refrigerated, but do NOT add ice or any other liquid to this solution. Shake well before drinking.    Drink 1 bottle of contrast @ 2 pm (2 hours prior to your exam)  Drink 1 bottle of contrast @ 3 pm (1 hour prior to your exam)  You may take any medications as prescribed with a small amount of water, if necessary. If you take any of the following medications: METFORMIN, GLUCOPHAGE, GLUCOVANCE, AVANDAMET, RIOMET, FORTAMET, [place] MET, JANUMET, GLUMETZA or METAGLIP, you MAY be asked to HOLD this medication 48 hours AFTER the exam.  The purpose of you drinking the oral contrast is to aid in the visualization of your intestinal tract. The contrast solution may cause some diarrhea. Depending on your individual set of symptoms, you may also receive an intravenous injection of x-ray contrast/dye. Plan on being at [facility] for 30 minutes or longer, depending on the type of exam you are having performed.  This test typically takes 30-45 minutes to complete.  If you have any questions regarding your exam or if  you need to reschedule, you may call the CT department at [phone] between the hours of 8:00 am and 5:00 pm, Monday-Friday.  ________________________________________________________________________

## 2018-09-21 ENCOUNTER — Other Ambulatory Visit (INDEPENDENT_AMBULATORY_CARE_PROVIDER_SITE_OTHER): Payer: 59

## 2018-09-21 ENCOUNTER — Telehealth: Payer: Self-pay | Admitting: *Deleted

## 2018-09-21 DIAGNOSIS — Z8719 Personal history of other diseases of the digestive system: Secondary | ICD-10-CM

## 2018-09-21 LAB — CREATININE, SERUM: Creatinine, Ser: 0.88 mg/dL (ref 0.40–1.50)

## 2018-09-21 LAB — BUN: BUN: 15 mg/dL (ref 6–23)

## 2018-09-21 NOTE — Telephone Encounter (Signed)

## 2018-09-22 ENCOUNTER — Ambulatory Visit (INDEPENDENT_AMBULATORY_CARE_PROVIDER_SITE_OTHER)
Admission: RE | Admit: 2018-09-22 | Discharge: 2018-09-22 | Disposition: A | Discharge status: Home / Self Care | Payer: 59 | Source: Ambulatory Visit | Attending: Gastroenterology | Admitting: Gastroenterology

## 2018-09-22 ENCOUNTER — Other Ambulatory Visit: Payer: Self-pay

## 2018-09-22 DIAGNOSIS — Z8719 Personal history of other diseases of the digestive system: Secondary | ICD-10-CM

## 2018-09-22 MED ORDER — IOHEXOL 300 MG/ML  SOLN
100.0000 mL | Freq: Once | INTRAMUSCULAR | Status: AC | PRN
  Administered 2018-09-22: 100 mL via INTRAVENOUS

## 2018-10-08 ENCOUNTER — Other Ambulatory Visit: Payer: Self-pay

## 2018-10-08 ENCOUNTER — Ambulatory Visit (INDEPENDENT_AMBULATORY_CARE_PROVIDER_SITE_OTHER): Payer: 59 | Admitting: Gastroenterology

## 2018-10-08 DIAGNOSIS — K863 Pseudocyst of pancreas: Secondary | ICD-10-CM

## 2018-10-08 DIAGNOSIS — K8689 Other specified diseases of pancreas: Secondary | ICD-10-CM | POA: Diagnosis not present

## 2018-10-08 DIAGNOSIS — R935 Abnormal findings on diagnostic imaging of other abdominal regions, including retroperitoneum: Secondary | ICD-10-CM

## 2018-10-08 DIAGNOSIS — K8591 Acute pancreatitis with uninfected necrosis, unspecified: Secondary | ICD-10-CM | POA: Diagnosis not present

## 2018-10-08 NOTE — Patient Instructions (Addendum)
Your provider has requested that you go to the basement level for lab work before leaving today. Press "B" on the elevator. The lab is located at the first door on the left as you exit the elevator.  You have been scheduled for a CT scan of the abdomen and pelvis at [facility] ([address]---this is in the same building as [employer] [facility]).   You are scheduled on ______11/05/2020_____at _____4:00pm______. You should arrive 15 minutes prior to your appointment time for registration. Please follow the written instructions below on the day of your exam:  WARNING: IF YOU ARE ALLERGIC TO IODINE/X-RAY DYE, PLEASE NOTIFY RADIOLOGY IMMEDIATELY AT [phone]! YOU WILL BE GIVEN A 13 HOUR PREMEDICATION PREP.  1) Do not eat or drink anything after ___12:00pm_____ (4 hours prior to your test) 2) You have been given 2 bottles of oral contrast to drink. The solution may taste better if refrigerated, but do NOT add ice or any other liquid to this solution. Shake well before drinking.    Drink 1 bottle of contrast @ __2:00pm__ (2 hours prior to your exam)  Drink 1 bottle of contrast @ ___3:00pm_ (1 hour prior to your exam)  You may take any medications as prescribed with a small amount of water, if necessary. If you take any of the following medications: METFORMIN, GLUCOPHAGE, GLUCOVANCE, AVANDAMET, RIOMET, FORTAMET, [place] MET, JANUMET, GLUMETZA or METAGLIP, you MAY be asked to HOLD this medication 48 hours AFTER the exam.  The purpose of you drinking the oral contrast is to aid in the visualization of your intestinal tract. The contrast solution may cause some diarrhea. Depending on your individual set of symptoms, you may also receive an intravenous injection of x-ray contrast/dye. Plan on being at [facility] for 30 minutes or longer, depending on the type of exam you are having performed.  This test typically takes 30-45 minutes to complete.  If you have any questions  regarding your exam or if you need to reschedule, you may call the CT department at [phone] between the hours of 8:00 am and 5:00 pm, Monday-Friday.  ________________________________________________________________________  Thank you for choosing me and [facility].  Dr. Rush Landmark

## 2018-10-08 NOTE — Progress Notes (Signed)
[place] VISIT   Primary Care Provider Karleen Hampshire., MD [address] [place] [address] [phone]  Patient Profile: Paul [name] is a 38 y.o. 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  The patient presents to the [facility] for an evaluation and management of problem(s) noted below:  Problem List 1. History of Necrotizing pancreatitis   2. Pancreatic pseudocyst   3. Atrophic pancreas   4. Abnormal CT of the abdomen     I connected with  Elie Goody on 10/08/18. I verified that I was speaking with the correct person using two identifiers. Due to the COVID-19 Pandemic, this service was provided via telemedicine using audiovisual media. The patient was located at home. The provider was located in the office. The patient did consent to this visit and is aware of charges through their insurance as well as the limitations of evaluation and management by telemedicine. Other persons participating in this telemedicine service were none. Time spent on visit was 34 minutes on phone and in coordination and discussion with patient.   History of Present Illness: Please see initial consultation note and prior progress notes for full details of HPI.    Interval History In the beginning of June we performed a follow-up C CT abdomen to evaluate the pancreas as it has been approximately 6 months since last CT in December.  This was to evaluate the pancreatic cystic lesion/pseudocyst that was present as well as just evaluate the pancreas and any other abnormalities from a vasculature perspective.  The results of that are below.  The pancreatic pseudocyst has enlarged slightly.  Today's visit was to follow-up and see how things have been  doing/going.  Overall, the patient has been able to stay well and safe and healthy during the COVID-19 pandemic.  His 2 children including his youngest daughter and wife are doing well.  He is staying at home working.  He recently has been following with an endocrinologist and his A1c is floating in the 8-8.5 range.  He feels more recently his blood sugars are doing better simply recheck his A1c it probably would be better.  The patient is having normal bowel movements on a daily basis, at times it can be harder.  His weight is been stable throughout the last few months and has not grossly gone up or down.  At times the patient will have an intermittent episode of abdominal discomfort that would last for a couple of hours and then go away.  He believes this to be more indigestion.  He cannot state that there are exactly certain foods that can cause this but he does experience this every few weeks.  It is not bothersome for him and he keeps able to tell that this is definitely not pancreatitis pain that he is previously had.  He denies current issues of nausea or vomiting or early.  He denies any dysphagia.  His primary care provider was considering a statin at some point in time but wanted to make sure from a pancreatitis perspective that it would not be too concerning to start something.    GI Review of Systems Positive as above Negative for odynophagia, change in bowel habits, melena, hematochezia, jaundice, pruritus  Review of Systems General: Denies fevers/chills Cardiovascular: Denies chest pain Pulmonary: Denies shortness  of breath Gastroenterological: See HPI Genitourinary: Denies darkened urine Dermatological: Jaundice Psychological: Mood is stable about his pancreas but concerned about his hair loss   Medications Current Outpatient Medications  Medication Sig Dispense Refill   allopurinol (ZYLOPRIM) 100 MG tablet Take 100 mg by mouth 2 (two) times daily.  1   amLODipine (NORVASC) 2.5  MG tablet Take 2.5 mg by mouth at bedtime.   2   atenolol (TENORMIN) 50 MG tablet Take 50 mg by mouth daily.     blood glucose meter kit and supplies Dispense based on patient and insurance preference. Use up to four times daily as directed. (FOR ICD-10 E10.9, E11.9). 1 each 0   insulin aspart (NOVOLOG) 100 UNIT/ML FlexPen Before each meal 3 times a day, 140-199 - 2 units, 200-250 - 4 units, 251-299 - 6 units,  300-349 - 8 units,  350 or above 10 units. Insulin PEN if approved, provide syringes and needles if needed. (Patient taking differently: Inject 2-4 Units into the skin 3 (three) times daily before meals. ) 15 mL 0   Insulin Pen Needle 31G X 5 MM MISC For insulin injection, please provide a month supply. 100 each 0   LEVEMIR FLEXTOUCH 100 UNIT/ML Pen Inject 8 Units into the skin at bedtime. (Patient taking differently: Inject 15 Units into the skin at bedtime. )     No current facility-administered medications for this visit.     Allergies Allergies  Allergen Reactions   Honey Diarrhea and Nausea And Vomiting   Other Rash    EGGPLANT    Histories Past Medical History:  Diagnosis Date   Cancer of kidney ([place]) 2011   "left"   Diabetes mellitus without complication (HCC)    Elevated blood uric acid level    "I take RX for it; Allopurinol" (11/04/2017)   GERD (gastroesophageal reflux disease)    High cholesterol    Hypertension    Migraine    "only in my teens" (11/04/2017)   Pancreatitis    Pneumonia    Past Surgical History:  Procedure Laterality Date   ADRENALECTOMY     left side   BIOPSY  12/11/2017   Procedure: BIOPSY;  Surgeon: Irving Copas., MD;  Location: [facility] ENDOSCOPY;  Service: Gastroenterology;;   ESOPHAGOGASTRODUODENOSCOPY (EGD) WITH PROPOFOL N/A 12/11/2017   Procedure: ESOPHAGOGASTRODUODENOSCOPY (EGD) WITH PROPOFOL;  Surgeon: Irving Copas., MD;  Location: [place];  Service: Gastroenterology;  Laterality: N/A;    ESOPHAGOGASTRODUODENOSCOPY (EGD) WITH PROPOFOL N/A 12/30/2017   Procedure: ESOPHAGOGASTRODUODENOSCOPY (EGD) WITH PROPOFOL Necrosectomy;  Surgeon: Rush Landmark Telford Nab., MD;  Location: [place];  Service: Gastroenterology;  Laterality: N/A;   IR CATHETER TUBE CHANGE  12/30/2017   IR RADIOLOGIST EVAL & MGMT  12/23/2017   IR RADIOLOGIST EVAL & MGMT  01/12/2018   IR RADIOLOGIST EVAL & MGMT  01/26/2018   NEPHRECTOMY Left 2011   "kidney cancer"   UPPER ESOPHAGEAL ENDOSCOPIC ULTRASOUND (EUS) N/A 12/11/2017   Procedure: UPPER ESOPHAGEAL ENDOSCOPIC ULTRASOUND (EUS);  Surgeon: Irving Copas., MD;  Location: [place];  Service: Gastroenterology;  Laterality: N/A;   UPPER GASTROINTESTINAL ENDOSCOPY  02/2017   Social History   Socioeconomic History   Marital status: Married    Spouse name: Not on file   Number of children: Not on file   Years of education: Not on file   Highest education level: Not on file  Occupational History   Not on file  Social Needs   Financial resource strain: Not on file  Food insecurity    Worry: Not on file    Inability: Not on file   Transportation needs    Medical: Not on file    Non-medical: Not on file  Tobacco Use   Smoking status: Former Smoker    Packs/day: 0.50    Years: 11.00    Pack years: 5.50    Types: Cigarettes    Quit date: 2011    Years since quitting: 9.5   Smokeless tobacco: Never Used  Substance and Sexual Activity   Alcohol use: Not Currently    Alcohol/week: 4.0 standard drinks    Types: 4 Cans of beer per week   Drug use: Never   Sexual activity: Yes  Lifestyle   Physical activity    Days per week: Not on file    Minutes per session: Not on file   Stress: Not on file  Relationships   Social connections    Talks on phone: Not on file    Gets together: Not on file    Attends religious service: Not on file    Active member of club or organization: Not on file    Attends meetings of clubs or  organizations: Not on file    Relationship status: Not on file   Intimate partner violence    Fear of current or ex partner: Not on file    Emotionally abused: Not on file    Physically abused: Not on file    Forced sexual activity: Not on file  Other Topics Concern   Not on file  Social History Narrative   Married, 1 child, wife expecting   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[employer]   Former smoker   4 beers/week   No drugs   Family History  Problem Relation Age of Onset   Hypertension Mother    Hypertension Father    Pancreatitis Paternal Uncle        twice, was a drinker   Colon cancer Neg Hx    Esophageal cancer Neg Hx    Inflammatory bowel disease Neg Hx    Liver disease Neg Hx    Pancreatic cancer Neg Hx    Rectal cancer Neg Hx    Stomach cancer Neg Hx    I have reviewed his medical, social, and family history in detail and updated the electronic medical record as necessary.    PHYSICAL EXAMINATION  Telehealth Visit   REVIEW OF DATA  I reviewed the following data at the time of this encounter:  GI Procedures and Studies  No new studies  Laboratory Studies  Reviewed in epic  Imaging Studies  01/26/18 CTAP IMPRESSION: 1. Near-total interval resolution of the previously identified pancreatic pseudocyst with only a small amount of residual fluid in the region the pancreatic tail and no further in intra fluid gas. 2. 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 These changes are almost certainly secondary to the recent severe pancreatitis. There is evidence of developing portosystemic collateral formation from the short gastric veins through the draining gastric veins. This poses a risk for gastric varix formation in the future. 3. Additional ancillary findings as above without significant interval change.  12/20 CTAP IMPRESSION: VASCULAR No acute arterial  abnormality.  No evidence of aortic aneurysm. 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 There is also severe narrowing of the superior mesenteric vein just  below its insertion into the portal vein. This may also contribute to varices. NON-VASCULAR Compared to the prior study, the drain within the pancreatic pseudocyst and pancreatic duct has been removed. The pseudocyst has reduced in size from a maximal diameter of 4.4 cm to a maximal diameter of 2.4 cm. Inflammatory changes about the tail of the pancreas have improved. Soft tissue densities along the right paracolic gutter are stable and most likely related to scar tissue from prior pancreatitis. Continued follow-up to ensure resolution of this finding is recommended as peritoneal carcinomatosis can have a similar appearance. Chronic findings include post left nephrectomy and focal fatty infiltration in the liver.  6/20 CTAP IMPRESSION: Interval increase in size of pseudocyst involving the pancreatic tail measuring up to 3.5 cm. There is a small amount of surrounding enhancing tissue which is favored to represent residual pancreatic parenchymal tissue. There is marked atrophy of the pancreatic body and tail. Recommend attention to this region on follow-up. Interval improvement in previously described peritoneal nodularity within the left upper and right lower quadrants.   ASSESSMENT  Mr. Morici is a 38 y.o. 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  The patient is seen today for evaluation and management of:  1. History of Necrotizing pancreatitis   2. Pancreatic pseudocyst   3. Atrophic pancreas   4. Abnormal CT of the abdomen     Overall, the patient is hemodynamically and clinically stable.  We did notice a slight increase in the size of the necrotic pseudocyst at the tail.  I suspect there could be a small leak in the setting of the atrophic appearing pancreas in the mid portion of the body with still having some active pancreas in the tail.  With that being said, there is no ductal dilation to suggest at this point in time that we have developed stricturing disease of the pancreas.  If the cyst continues to enlarge over the course of time and become symptomatic then we may have to consider the role of a distal pancreatectomy.  If the cyst enlarges to the point where we think he needs endoscopic drainage and we will attempt that.  The patient's already had complications from a endocrinologic perspective by becoming diabetic.  From an exocrine perspective he does not give symptoms that would be overtly suggestive of exocrine insufficiency but we will evaluate his fecal elastase and determine the need for pancreatic enzyme replacement therapy.  I plan on repeating the CAT scan in approximately 6 months to reevaluate and see if anything has changed dramatically.  Patient will obviously let us know if anything changes as well.  There is some recent data that has suggested anyone who has acute recurrent idiopathic pancreatitis and still has gallbladder in place that a cholecystectomy can decrease the recurrence rate by 25% even if microlithiasis has not been found.  We discussed this previously and at a [facility] second referral/consultation last year they recommended considering a cholecystectomy at some point in time.  The patient wants to hold on that currently but may consider down the road.  In regards to his cholesterol issues I do agree with trying to optimize his diet first.  Most of the statins have been associated with some form of pancreatitis.Rosuvastatin, lovastatin, and fluvastatin are class IV agents based on the drug-induced  pancreatitis data that is out.  Atorvastatin is a class III agent.  Pravastatin and simvastatin are class I agents.  If it is felt that a statin is necessary my recommendation would be to consider rosuvastatin as the agent of choice and should is a class IV agent.  If there are other agents, not a fenofibrate as this may have been a cause for potential pancreatitis in our patient last year, potentially Zetia, that may be reasonable.  I will defer this however to primary care and endocrinology.  All patient questions were answered, to the best of my ability, and the patient agrees to the aforementioned plan of action with follow-up as indicated.   PLAN  Fecal elastase to be obtained CT scan in 6 months to reevaluate pancreas as well as pancreatic cyst Patient to update [place] with any changes in the interim Continue low-fat diet Hold on cholecystectomy consideration As discussed above please see consideration for type of statin if needed   Orders Placed This Encounter  Procedures   CT ABDOMEN W CONTRAST   Pancreatic Elastase, Fecal    New Prescriptions   No medications on file   Modified Medications   No medications on file    Planned Follow Up: No follow-ups on file.   Justice Britain, MD [facility] Office # [phone]

## 2018-10-09 ENCOUNTER — Encounter: Payer: Self-pay | Admitting: Gastroenterology

## 2018-10-09 DIAGNOSIS — K8689 Other specified diseases of pancreas: Secondary | ICD-10-CM | POA: Insufficient documentation

## 2018-10-09 DIAGNOSIS — K863 Pseudocyst of pancreas: Secondary | ICD-10-CM | POA: Insufficient documentation

## 2018-10-18 ENCOUNTER — Other Ambulatory Visit: Payer: 59

## 2018-10-18 DIAGNOSIS — K863 Pseudocyst of pancreas: Secondary | ICD-10-CM

## 2018-10-18 DIAGNOSIS — K8591 Acute pancreatitis with uninfected necrosis, unspecified: Secondary | ICD-10-CM

## 2018-10-26 LAB — PANCREATIC ELASTASE, FECAL: Pancreatic Elastase-1, Stool: 48 mcg/g — ABNORMAL LOW

## 2018-11-23 ENCOUNTER — Ambulatory Visit: Payer: 59 | Admitting: Gastroenterology

## 2018-11-26 ENCOUNTER — Ambulatory Visit: Payer: 59 | Admitting: Gastroenterology

## 2018-11-26 VITALS — BP 116/60 | HR 70 | Temp 97.4°F | Ht 70.0 in | Wt 164.8 lb

## 2018-11-26 DIAGNOSIS — K863 Pseudocyst of pancreas: Secondary | ICD-10-CM | POA: Diagnosis not present

## 2018-11-26 DIAGNOSIS — K8502 Idiopathic acute pancreatitis with infected necrosis: Secondary | ICD-10-CM

## 2018-11-26 DIAGNOSIS — R140 Abdominal distension (gaseous): Secondary | ICD-10-CM | POA: Diagnosis not present

## 2018-11-26 DIAGNOSIS — K8681 Exocrine pancreatic insufficiency: Secondary | ICD-10-CM | POA: Diagnosis not present

## 2018-11-26 MED ORDER — PANCRELIPASE (LIP-PROT-AMYL) 36000-114000 UNITS PO CPEP: ORAL_CAPSULE | ORAL | 2 refills | Status: DC

## 2018-11-26 NOTE — Patient Instructions (Addendum)
We have sent the following medications to your pharmacy for you to pick up at your convenience: Creon   Samples of this drug were given to the patient,  Creon ;quantity 4 boxes , Lot Number [id number] Start with 1 capsule (36,000) with each meal and snack daily for 1 week.  You will then start prescription of Creon as per written prescription thereafter.   We will see you back in follow-up 1 week after your CT Scan in Nov 2020.   Thank you for choosing me and [facility].  Dr. Rush Landmark

## 2018-11-28 ENCOUNTER — Encounter: Payer: Self-pay | Admitting: Gastroenterology

## 2018-11-28 DIAGNOSIS — K8681 Exocrine pancreatic insufficiency: Secondary | ICD-10-CM | POA: Insufficient documentation

## 2018-11-28 DIAGNOSIS — R140 Abdominal distension (gaseous): Secondary | ICD-10-CM | POA: Insufficient documentation

## 2018-11-28 NOTE — Progress Notes (Signed)
All  GASTROENTEROLOGY OUTPATIENT CLINIC VISIT   Primary Care Provider Karleen Hampshire., MD [address] [place] [address] [phone]  Patient Profile: Paul [name] is a 38 y.o. 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  The patient presents to the [facility] for an evaluation and management of problem(s) noted below:  Problem List 1. Exocrine pancreatic insufficiency   2. Bloating   3. History of Idiopathic acute pancreatitis with necrosis   4. Pancreatic pseudocyst     History of Present Illness: Please see initial consultation note and prior progress notes for full details of HPI.    Interval History The patient has done well overall since our last clinic visit.  We found him to have severe exocrine insufficiency based on his fecal elastase.  Today's visit, was for [place] to discuss symptoms and consider the role of pancreatic enzyme replacement therapy.  Patient describes no issues with regards to significant abdominal pain.  Infrequently, the patient will have a sensation of bloating but he does not have any nausea or vomiting.  His weight has been stable.  His diabetes continues to be difficult to control and they are continue to make adjustments on his insulin regimen.  He is not on U1 140 500 however.  He may be initiated on a glucose monitoring device such as freestyle libre or Dexcom but is not sure what he is going to go ahead with in the future.  An insulin pump has been discussed as well.  The patient wanted to relay as he has previously and other notation in the chart that he continues to have stools that are more on the hard/constipated side rather than any diarrhea.  No blood  in the stools.  He has noted a slight protuberance at the bottom of his rectum and wonders whether this is hemorrhoidal in nature.  GI Review of Systems Positive as above Negative for dysphagia, odynophagia, pyrosis, change in bowel habits, melena, hematochezia   Review of Systems General: Denies fevers/chills Cardiovascular: Denies chest pain Pulmonary: Denies shortness of breath Gastroenterological: See HPI Genitourinary: Denies darkened urine Dermatological: Jaundice Psychological: Mood is stable   Medications Current Outpatient Medications  Medication Sig Dispense Refill   allopurinol (ZYLOPRIM) 100 MG tablet Take 100 mg by mouth 2 (two) times daily.  1   amLODipine (NORVASC) 2.5 MG tablet Take 2.5 mg by mouth at bedtime.   2   atenolol (TENORMIN) 50 MG tablet Take 50 mg by mouth daily.     blood glucose meter kit and supplies Dispense based on patient and insurance preference. Use up to four times daily as directed. (FOR ICD-10 E10.9, E11.9). 1 each 0   insulin aspart (NOVOLOG) 100 UNIT/ML FlexPen Before each meal 3 times a day, 140-199 - 2 units, 200-250 - 4 units, 251-299 - 6 units,  300-349 - 8 units,  350 or above 10 units. Insulin PEN if approved, provide syringes and needles if needed. (Patient taking differently: Inject 2-4 Units into the skin 3 (three) times daily before meals. ) 15 mL 0   Insulin Pen Needle 31G X 5 MM MISC For insulin injection, please provide a month supply. 100 each 0   LEVEMIR FLEXTOUCH 100 UNIT/ML Pen Inject 8 Units into the skin  at bedtime. (Patient taking differently: Inject 15 Units into the skin at bedtime. )     simethicone (MYLICON) 80 MG chewable tablet Chew 80 mg by mouth as needed for flatulence.     lipase/protease/amylase (CREON) 36000 UNITS CPEP capsule Take 2 capsules with each meal. Take 1 capsule with each snack.( Up to 2 snacks daily.) 240 capsule 2   No current facility-administered medications for this visit.      Allergies Allergies  Allergen Reactions   Honey Diarrhea and Nausea And Vomiting   Other Rash    EGGPLANT    Histories Past Medical History:  Diagnosis Date   Cancer of kidney ([place]) 2011   "left"   Diabetes mellitus without complication (HCC)    Elevated blood uric acid level    "I take RX for it; Allopurinol" (11/04/2017)   GERD (gastroesophageal reflux disease)    High cholesterol    Hypertension    Migraine    "only in my teens" (11/04/2017)   Pancreatitis    Pneumonia    Past Surgical History:  Procedure Laterality Date   ADRENALECTOMY     left side   BIOPSY  12/11/2017   Procedure: BIOPSY;  Surgeon: Irving Copas., MD;  Location: [facility];  Service: Gastroenterology;;   ESOPHAGOGASTRODUODENOSCOPY (EGD) WITH PROPOFOL N/A 12/11/2017   Procedure: ESOPHAGOGASTRODUODENOSCOPY (EGD) WITH PROPOFOL;  Surgeon: Irving Copas., MD;  Location: [facility];  Service: Gastroenterology;  Laterality: N/A;   ESOPHAGOGASTRODUODENOSCOPY (EGD) WITH PROPOFOL N/A 12/30/2017   Procedure: ESOPHAGOGASTRODUODENOSCOPY (EGD) WITH PROPOFOL Necrosectomy;  Surgeon: Rush Landmark Telford Nab., MD;  Location: [place];  Service: Gastroenterology;  Laterality: N/A;   IR CATHETER TUBE CHANGE  12/30/2017   IR RADIOLOGIST EVAL & MGMT  12/23/2017   IR RADIOLOGIST EVAL & MGMT  01/12/2018   IR RADIOLOGIST EVAL & MGMT  01/26/2018   NEPHRECTOMY Left 2011   "kidney cancer"   UPPER ESOPHAGEAL ENDOSCOPIC ULTRASOUND (EUS) N/A 12/11/2017   Procedure: UPPER ESOPHAGEAL ENDOSCOPIC ULTRASOUND (EUS);  Surgeon: Irving Copas., MD;  Location: [place];  Service: Gastroenterology;  Laterality: N/A;   UPPER GASTROINTESTINAL ENDOSCOPY  02/2017   Social History   Socioeconomic History   Marital status: Married    Spouse name: Not on file   Number of children: Not on file   Years of education: Not on file   Highest education level: Not on file  Occupational  History   Not on file  Social Needs   Financial resource strain: Not on file   Food insecurity    Worry: Not on file    Inability: Not on file   Transportation needs    Medical: Not on file    Non-medical: Not on file  Tobacco Use   Smoking status: Former Smoker    Packs/day: 0.50    Years: 11.00    Pack years: 5.50    Types: Cigarettes    Quit date: 2011    Years since quitting: 9.6   Smokeless tobacco: Never Used  Substance and Sexual Activity   Alcohol use: Not Currently    Alcohol/week: 4.0 standard drinks    Types: 4 Cans of beer per week   Drug use: Never   Sexual activity: Yes  Lifestyle   Physical activity    Days per week: Not on file    Minutes per session: Not on file   Stress: Not on file  Relationships   Social connections    Talks on phone: Not on file  Gets together: Not on file    Attends religious service: Not on file    Active member of club or organization: Not on file    Attends meetings of clubs or organizations: Not on file    Relationship status: Not on file   Intimate partner violence    Fear of current or ex partner: Not on file    Emotionally abused: Not on file    Physically abused: Not on file    Forced sexual activity: Not on file  Other Topics Concern   Not on file  Social History Narrative   Married, 1 child, wife expecting   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   Former smoker   4 beers/week   No drugs   Family History  Problem Relation Age of Onset   Hypertension Mother    Hypertension Father    Pancreatitis Paternal Uncle        twice, was a drinker   Colon cancer Neg Hx    Esophageal cancer Neg Hx    Inflammatory bowel disease Neg Hx    Liver disease Neg Hx    Pancreatic cancer Neg Hx    Rectal cancer Neg Hx    Stomach cancer Neg Hx    I have reviewed his medical, social, and family history in detail and updated the electronic medical record as necessary.    PHYSICAL EXAMINATION  BP  116/60    Pulse 70    Temp (!) 97.4 F (36.3 C)    Ht _0  (1.778 m)    Wt 164 lb 12.8 oz (74.8 kg)    SpO2 98%    BMI 23.65 kg/m  GEN: NAD, appears stated age, doesn't appear chronically ill PSYCH: Cooperative, without pressured speech EYE: Conjunctivae pink, sclerae anicteric ENT: MMM CV: RR without R/Gs  RESP: CTAB posteriorly, without wheezing GI: NABS, soft, NT/ND, without rebound or guarding, no HSM appreciated GU: DRE shows what looks to be internal hemorrhoids, no palpable rectal lesion, normal tone, brown stool in vault MSK/EXT: No lower extremity edema SKIN: No jaundice NEURO:  Alert & Oriented x 3, no focal deficits   REVIEW OF DATA  I reviewed the following data at the time of this encounter:  GI Procedures and Studies  No new studies  Laboratory Studies  Reviewed in epic  Imaging Studies  June 2020 CT abdomen pelvis with contrast IMPRESSION: Interval increase in size of pseudocyst involving the pancreatic tail measuring up to 3.5 cm. There is a small amount of surrounding enhancing tissue which is favored to represent residual pancreatic parenchymal tissue. There is marked atrophy of the pancreatic body and tail. Recommend attention to this region on follow-up. Interval improvement in previously described peritoneal nodularity within the left upper and right lower quadrants.   ASSESSMENT  Mr. Mcglone is a 38 y.o. 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  The patient is seen today for evaluation and management of:  1. Exocrine pancreatic insufficiency   2. Bloating   3. History of Idiopathic acute pancreatitis with necrosis   4. Pancreatic pseudocyst    The patient is hemodynamically and clinically stable.  Although he does not have clinical evidence of the  malabsorptive symptoms that would usually go along with exocrine pancreatic insufficiency, he does have some bloating symptoms.  His fecal elastase suggest severe exocrine insufficiency.  I recommend that we initiate pancreatic enzyme replacement therapy and see how he feels overall.  We will have to monitor his stools since these agents can cause patients to develop constipation.  We will initiate chronic pancreatitis dosing at 72,000 units with each meal and one 36,000 tablet with each snack.  Can consider weight boat based increases after a few weeks time on this regimen.  We will plan as previously scheduled for a repeat imaging study 6 months after his CT scan from June and we are planning that for November or December of this year.  We will follow-up his imaging studies and ensure that prior cyst does not show evidence of significant increase in size that would be concerning for the potential of a distal pancreatic leak into the cyst region.  I agree with the patient's endocrinologist for further continuous blood glucose monitoring versus insulin pump use to try to optimize his therapy and get his A1c under better control.  We discussed consideration of surgical referral as we had previously for idiopathic pancreatitis patients as recent data suggests that those patients who have acute recurrent idiopathic pancreatitis and still have a gallbladder may have a decrease of approximately 25% of recurrent pancreatitis episodes if they still have the gallbladder and have it removed (even if microlithiasis has not been found).  All patient questions were answered, to the best of my ability, and the patient agrees to the aforementioned plan of action with follow-up as indicated.   PLAN  Creon 72,000 units with each meal and 36,000 units with each snack CT scan in November/December to reevaluate pancreatic cyst Patient update [place] with any changes in interim Continue low-fat diet Surgical referral for  consideration of cholecystectomy on hold   No orders of the defined types were placed in this encounter.   New Prescriptions   LIPASE/PROTEASE/AMYLASE (CREON) 36000 UNITS CPEP CAPSULE    Take 2 capsules with each meal. Take 1 capsule with each snack.( Up to 2 snacks daily.)   Modified Medications   No medications on file    Planned Follow Up: No follow-ups on file.   Justice Britain, MD [facility] Office # [phone]

## 2019-01-07 ENCOUNTER — Telehealth: Payer: Self-pay | Admitting: Gastroenterology

## 2019-01-10 MED ORDER — PANCRELIPASE (LIP-PROT-AMYL) 36000-114000 UNITS PO CPEP: ORAL_CAPSULE | ORAL | 2 refills | Status: DC

## 2019-01-10 NOTE — Telephone Encounter (Signed)
Refill for Creon sent to [facility]

## 2019-02-01 ENCOUNTER — Other Ambulatory Visit: Payer: Self-pay

## 2019-02-01 DIAGNOSIS — K8681 Exocrine pancreatic insufficiency: Secondary | ICD-10-CM

## 2019-02-01 NOTE — Progress Notes (Signed)
Lab order for Bmet in epic

## 2019-02-02 ENCOUNTER — Other Ambulatory Visit (INDEPENDENT_AMBULATORY_CARE_PROVIDER_SITE_OTHER): Payer: 59

## 2019-02-02 DIAGNOSIS — K8681 Exocrine pancreatic insufficiency: Secondary | ICD-10-CM

## 2019-02-02 LAB — BASIC METABOLIC PANEL
BUN: 18 mg/dL (ref 6–23)
CO2: 27 mEq/L (ref 19–32)
Calcium: 9.9 mg/dL (ref 8.4–10.5)
Chloride: 100 mEq/L (ref 96–112)
Creatinine, Ser: 0.95 mg/dL (ref 0.40–1.50)
GFR: 88.87 mL/min (ref >60.00)
Glucose, Bld: 211 mg/dL — ABNORMAL HIGH (ref 70–99)
Potassium: 3.8 mEq/L (ref 3.5–5.1)
Sodium: 134 mEq/L — ABNORMAL LOW (ref 135–145)

## 2019-02-03 ENCOUNTER — Ambulatory Visit (INDEPENDENT_AMBULATORY_CARE_PROVIDER_SITE_OTHER)
Admission: RE | Admit: 2019-02-03 | Discharge: 2019-02-03 | Disposition: A | Discharge status: Home / Self Care | Payer: 59 | Source: Ambulatory Visit | Attending: Gastroenterology | Admitting: Gastroenterology

## 2019-02-03 ENCOUNTER — Other Ambulatory Visit: Payer: Self-pay

## 2019-02-03 DIAGNOSIS — K8591 Acute pancreatitis with uninfected necrosis, unspecified: Secondary | ICD-10-CM | POA: Diagnosis not present

## 2019-02-03 DIAGNOSIS — K863 Pseudocyst of pancreas: Secondary | ICD-10-CM | POA: Diagnosis not present

## 2019-02-03 MED ORDER — IOHEXOL 300 MG/ML  SOLN
100.0000 mL | Freq: Once | INTRAMUSCULAR | Status: AC | PRN
  Administered 2019-02-03: 100 mL via INTRAVENOUS

## 2019-02-08 ENCOUNTER — Telehealth: Payer: Self-pay

## 2019-02-08 NOTE — Telephone Encounter (Signed)
-----   Message from Irving Copas., MD sent at 02/06/2019  6:29 PM EST ----- Regarding: Follow up Paul [name], Let's plan a follow up in clinic in December/January based on patient preference. Can you reach out to him this week? Thanks. GM

## 2019-02-08 NOTE — Telephone Encounter (Signed)
The pt has been scheduled for 12/31 at 850 am with Dr Rush Landmark. Pt aware

## 2019-02-15 ENCOUNTER — Other Ambulatory Visit: Payer: Self-pay | Admitting: Gastroenterology

## 2019-02-15 DIAGNOSIS — R945 Abnormal results of liver function studies: Secondary | ICD-10-CM

## 2019-02-15 DIAGNOSIS — R7989 Other specified abnormal findings of blood chemistry: Secondary | ICD-10-CM

## 2019-02-15 DIAGNOSIS — K8500 Idiopathic acute pancreatitis without necrosis or infection: Secondary | ICD-10-CM

## 2019-03-31 ENCOUNTER — Encounter: Payer: Self-pay | Admitting: Gastroenterology

## 2019-03-31 ENCOUNTER — Ambulatory Visit: Payer: 59 | Admitting: Gastroenterology

## 2019-03-31 VITALS — BP 100/74 | HR 64 | Temp 97.9°F | Ht 69.5 in | Wt 170.1 lb

## 2019-03-31 DIAGNOSIS — R935 Abnormal findings on diagnostic imaging of other abdominal regions, including retroperitoneum: Secondary | ICD-10-CM | POA: Diagnosis not present

## 2019-03-31 DIAGNOSIS — K8591 Acute pancreatitis with uninfected necrosis, unspecified: Secondary | ICD-10-CM

## 2019-03-31 DIAGNOSIS — R140 Abdominal distension (gaseous): Secondary | ICD-10-CM | POA: Diagnosis not present

## 2019-03-31 DIAGNOSIS — K8681 Exocrine pancreatic insufficiency: Secondary | ICD-10-CM

## 2019-03-31 NOTE — Patient Instructions (Signed)
If you are age 37 or younger, your body mass index should be between 19-25. Your Body mass index is 24.76 kg/m. If this is out of the aformentioned range listed, please consider follow up with your Primary Care Provider.   Due to recent changes in healthcare laws, you may see the results of your imaging and laboratory studies on MyChart before your provider has had a chance to review them.  We understand that in some cases there may be results that are confusing or concerning to you. Not all laboratory results come back in the same time frame and the provider may be waiting for multiple results in order to interpret others.  Please give [place] 48 hours in order for your provider to thoroughly review all the results before contacting the office for clarification of your results.   You will need to have a MRI/MRCP in 4-6 months. Our office will contact you when it is time to schedule.   Continue current dose of Creon.   Thank you for choosing me and [facility].  Dr. Rush Landmark

## 2019-03-31 NOTE — Progress Notes (Signed)
All  GASTROENTEROLOGY OUTPATIENT CLINIC VISIT   Primary Care Provider Karleen Hampshire., MD [address] [place] [address] [phone]  Patient Profile: Paul [name] is a 38 y.o. 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  The patient presents to the [facility] for an evaluation and management of problem(s) noted below:  Problem List 1. History of Necrotizing pancreatitis   2. Abnormal CT of the abdomen   3. Exocrine pancreatic insufficiency   4. Bloating     History of Present Illness: Please see initial consultation note and prior progress notes for full details of HPI.    Interval History Today, the patient returns for further follow-up.  The patient underwent a CT scan with results as below.  The patient has been initiated on Creon supplementation and has been doing well.  The patient feels that he is having a good bowel movement 1-2 times daily.  There is no significant looseness.  He has not developed significant constipation.  The patient has been gaining weight.  Still having some fluctuations with his blood sugar control but improved from earlier this year.  The patient's bloating has improved but he still has this occur at times.  He has no significant postprandial abdominal pain.  He is not having food aversion.  The patient wonders whether we would want him to go ahead and have his gallbladder removed.  GI Review of Systems Positive as above Negative for pyrosis, odynophagia, dysphagia, GERD, nausea, vomiting, abdominal pain, change in bowel habits, melena, hematochezia  Review of Systems General: Denies fevers/chills Cardiovascular: Denies chest pain Pulmonary: Denies  shortness of breath Gastroenterological: See HPI Genitourinary: Denies darkened urine Dermatological: Denies jaundice Psychological: Mood is stable   Medications Current Outpatient Medications  Medication Sig Dispense Refill  . allopurinol (ZYLOPRIM) 100 MG tablet Take 100 mg by mouth 2 (two) times daily.  1  . amLODipine (NORVASC) 2.5 MG tablet Take 2.5 mg by mouth at bedtime.   2  . atenolol (TENORMIN) 50 MG tablet Take 50 mg by mouth daily.    . blood glucose meter kit and supplies Dispense based on patient and insurance preference. Use up to four times daily as directed. (FOR ICD-10 E10.9, E11.9). 1 each 0  . insulin aspart (NOVOLOG) 100 UNIT/ML FlexPen Before each meal 3 times a day, 140-199 - 2 units, 200-250 - 4 units, 251-299 - 6 units,  300-349 - 8 units,  350 or above 10 units. Insulin PEN if approved, provide syringes and needles if needed. (Patient taking differently: Inject 2-4 Units into the skin 3 (three) times daily before meals. ) 15 mL 0  . Insulin Pen Needle 31G X 5 MM MISC For insulin injection, please provide a month supply. 100 each 0  . LEVEMIR FLEXTOUCH 100 UNIT/ML Pen Inject 8 Units into the skin at bedtime. (Patient taking differently: Inject 24 Units into the skin at bedtime. )    . lipase/protease/amylase (CREON) 36000 UNITS CPEP capsule Take 2 capsules with each meal. Take 1 capsule with each snack.( Up to 2 snacks daily.) 240 capsule 2  . simethicone (MYLICON) 80 MG chewable tablet Chew 80 mg by mouth as needed for flatulence.     No current facility-administered medications for this visit.  Allergies Allergies  Allergen Reactions  . Honey Diarrhea and Nausea And Vomiting  . Other Rash    EGGPLANT    Histories Past Medical History:  Diagnosis Date  . Cancer of kidney [facility]) 2011   "left"  . Diabetes mellitus without complication ([place])   . Elevated blood uric acid level    "I take RX for it; Allopurinol" (11/04/2017)  . GERD (gastroesophageal reflux  disease)   . High cholesterol   . Hypertension   . Migraine    "only in my teens" (11/04/2017)  . Pancreatitis   . Pneumonia    Past Surgical History:  Procedure Laterality Date  . ADRENALECTOMY     left side  . BIOPSY  12/11/2017   Procedure: BIOPSY;  Surgeon: Rush Landmark Telford Nab., MD;  Location: [place];  Service: Gastroenterology;;  . ESOPHAGOGASTRODUODENOSCOPY (EGD) WITH PROPOFOL N/A 12/11/2017   Procedure: ESOPHAGOGASTRODUODENOSCOPY (EGD) WITH PROPOFOL;  Surgeon: Irving Copas., MD;  Location: [place];  Service: Gastroenterology;  Laterality: N/A;  . ESOPHAGOGASTRODUODENOSCOPY (EGD) WITH PROPOFOL N/A 12/30/2017   Procedure: ESOPHAGOGASTRODUODENOSCOPY (EGD) WITH PROPOFOL Necrosectomy;  Surgeon: Rush Landmark Telford Nab., MD;  Location: [place];  Service: Gastroenterology;  Laterality: N/A;  . IR CATHETER TUBE CHANGE  12/30/2017  . IR RADIOLOGIST EVAL & MGMT  12/23/2017  . IR RADIOLOGIST EVAL & MGMT  01/12/2018  . IR RADIOLOGIST EVAL & MGMT  01/26/2018  . NEPHRECTOMY Left 2011   "kidney cancer"  . UPPER ESOPHAGEAL ENDOSCOPIC ULTRASOUND (EUS) N/A 12/11/2017   Procedure: UPPER ESOPHAGEAL ENDOSCOPIC ULTRASOUND (EUS);  Surgeon: Irving Copas., MD;  Location: [place];  Service: Gastroenterology;  Laterality: N/A;  . UPPER GASTROINTESTINAL ENDOSCOPY  02/2017   Social History   Socioeconomic History  . Marital status: Married    Spouse name: Not on file  . Number of children: Not on file  . Years of education: Not on file  . Highest education level: Not on file  Occupational History  . Not on file  Tobacco Use  . Smoking status: Former Smoker    Packs/day: 0.50    Years: 11.00    Pack years: 5.50    Types: Cigarettes    Quit date: 2011    Years since quitting: 10.0  . Smokeless tobacco: Never Used  Substance and Sexual Activity  . Alcohol use: Not Currently    Alcohol/week: 4.0 standard drinks    Types: 4 Cans of beer per week  . Drug use:  Never  . Sexual activity: Yes  Other Topics Concern  . 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 Difficulty of Paying Living Expenses: Not on file  Food Insecurity:   . Worried About Charity fundraiser in the Last Year: Not on file  . Ran Out of Food in the Last Year: Not on file  Transportation Needs:   . Lack of Transportation (Medical): Not on file  . Lack of Transportation (Non-Medical): Not on file  Physical Activity:   . Days of Exercise per Week: Not on file  . Minutes of Exercise per Session: Not on file  Stress:   . Feeling of Stress : Not on file  Social Connections:   . Frequency of Communication with Friends and Family: Not on file  . Frequency of  Social Gatherings with Friends and Family: Not on file  . Attends Religious Services: Not on file  . Active Member of Clubs or Organizations: Not on file  . Attends Archivist Meetings: Not on file  . Marital Status: Not on file  Intimate Partner Violence:   . Fear of Current or Ex-Partner: Not on file  . Emotionally Abused: Not on file  . Physically Abused: Not on file  . Sexually Abused: Not on file   Family History  Problem Relation Age of Onset  . Hypertension Mother   . Hypertension Father   . Pancreatitis Paternal Uncle        twice, was a drinker  . Colon cancer Neg Hx   . Esophageal cancer Neg Hx   . Inflammatory bowel disease Neg Hx   . Liver disease Neg Hx   . Pancreatic cancer Neg Hx   . Rectal cancer Neg Hx   . Stomach cancer Neg Hx    I have reviewed his medical, social, and family history in detail and updated the electronic medical record as necessary.    PHYSICAL EXAMINATION  BP 100/74 (BP Location: Left Arm, Patient Position: Sitting, Cuff Size: Normal)   Pulse 64   Temp 97.9 F (36.6 C)   Ht 5' 9.5"  (1.765 m)   Wt 170 lb 2 oz (77.2 kg)   BMI 24.76 kg/m  GEN: NAD, appears stated age, doesn't appear chronically ill PSYCH: Cooperative, without pressured speech EYE: Conjunctivae pink, sclerae anicteric CV: RR without R/Gs  RESP: CTAB posteriorly, without wheezing GI: NABS, soft, NT/ND, without rebound or guarding, no HSM appreciated MSK/EXT: No lower extremity edema SKIN: No jaundice NEURO:  Alert & Oriented x 3, no focal deficits   REVIEW OF DATA  I reviewed the following data at the time of this encounter:  GI Procedures and Studies  No new studies  Laboratory Studies  Reviewed in epic  Imaging Studies  November 2020 CT abdomen pelvis with and without contrast IMPRESSION: 1. Subacute to chronic changes of necrotizing pancreatitis involving the head, neck, body, and proximal tail of pancreas noted. There is normally enhancing pancreatic tissue remaining involving the distal tail and uncinate process of pancreas. 2. Resolution of previous fluid collection within tail of pancreas. No new fluid collections identified at this time.   ASSESSMENT  Mr. Lesch is a 38 y.o. 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  The patient is seen today for evaluation and management of:  1. History of Necrotizing pancreatitis   2. Abnormal CT of the abdomen   3. Exocrine pancreatic insufficiency   4. Bloating    The patient is clinically and hemodynamically well.  He has done well with the initiation of pancreatic enzyme replacement therapy.  We will continue his current dosing of 72,000 units with each meal and one 36,000 unit tablet with each snack.  We will hold on weight-based dosing at this time.  The patient's most recent CT suggests improvement overall without any significant signs of  thrombus in the splenic vasculature.  The patient does have evidence of chronic changes as a result of his prior necrotizing pancreatitis in the head/neck/body and proximal tail.  There is normal enhancing pancreas tissue in the uncinate as well as the distal tail.  Overall, this does place him at potential risk of  developing PD stricturing disease that could be a source of recurrent pancreatitis in the future.  Thankfully, there is no significant PD disruption that we can see at this time and the previous cyst in the tail seems to be improving.  I think an MRI/MRCP in 4 to 6 months as long as the patient is still doing well would be reasonable to help guide [place] in regards to the role of ERCP and PD evaluation if necessary.  He understands that he will remain at risk of recurrent pancreatitis in the future should he develop PD stricturing disease.  We also once again, discussed the role of cholecystectomy in the setting of idiopathic pancreatitis and a slight decrease similar episodes of patients who may have idiopathic pancreatitis after their gallbladders removed.  The patient will benefit from discussion and he would like to have a surgical referral placed.  I will place this at [facility] per the patient preference (Dr. Terrance Mass. Zani/Dr. Dossie Der would be my preference).  This is nonurgent and again a discussion as to the role of cholecystectomy is reasonable even though we have not ever truly shown other than an episode of biliary sludge in the gallbladder of true cholelithiasis.  He will continue to follow-up with his endocrinologist.  All patient questions were answered, to the best of my ability, and the patient agrees to the aforementioned plan of action with follow-up as indicated.   PLAN  Continue Creon 72,000 units with each meal and 36,000 units with each snack MRI/MRCP in 4 to 6 months Continue low-fat diet Ideal to minimize any alcohol consumption as best as possible Surgical referral for  consideration of cholecystectomy to [place] will be placed as a nonurgent referral   No orders of the defined types were placed in this encounter.   New Prescriptions   No medications on file   Modified Medications   No medications on file    Planned Follow Up: Return in about 4 months (around 07/29/2019).   Justice Britain, MD [facility] Office # [phone]

## 2019-04-25 ENCOUNTER — Telehealth: Payer: Self-pay

## 2019-04-25 DIAGNOSIS — K8681 Exocrine pancreatic insufficiency: Secondary | ICD-10-CM

## 2019-04-25 DIAGNOSIS — K8591 Acute pancreatitis with uninfected necrosis, unspecified: Secondary | ICD-10-CM

## 2019-04-25 NOTE — Telephone Encounter (Signed)
Referral faxed to Dr Dossie Der office at [facility] fax# [phone] / [phone]

## 2019-04-25 NOTE — Telephone Encounter (Signed)
-----   Message from Irving Copas., MD sent at 03/31/2019  1:54 PM EST ----- Rolando,This patient needs a nonurgent referral to [place] surgery (Dr. Barth Kirks. [place]/Dr. Dossie Der) to consider cholecystectomy for idiopathic pancreatitis.  This is nonurgent.  Thank you.GM

## 2019-05-03 ENCOUNTER — Other Ambulatory Visit: Payer: Self-pay | Admitting: Gastroenterology

## 2020-05-04 ENCOUNTER — Other Ambulatory Visit (INDEPENDENT_AMBULATORY_CARE_PROVIDER_SITE_OTHER): Payer: 59

## 2020-05-04 ENCOUNTER — Ambulatory Visit: Payer: 59 | Admitting: Nurse Practitioner

## 2020-05-04 ENCOUNTER — Encounter: Payer: Self-pay | Admitting: Nurse Practitioner

## 2020-05-04 VITALS — BP 100/70 | HR 56 | Ht 69.69 in

## 2020-05-04 DIAGNOSIS — K863 Pseudocyst of pancreas: Secondary | ICD-10-CM

## 2020-05-04 DIAGNOSIS — Z8719 Personal history of other diseases of the digestive system: Secondary | ICD-10-CM | POA: Diagnosis not present

## 2020-05-04 LAB — CBC
HCT: 38.7 % — ABNORMAL LOW (ref 39.0–52.0)
Hemoglobin: 13.2 g/dL (ref 13.0–17.0)
MCHC: 34.1 g/dL (ref 30.0–36.0)
MCV: 84.6 fl (ref 78.0–100.0)
Platelets: 265 10*3/uL (ref 150.0–400.0)
RBC: 4.57 Mil/uL (ref 4.22–5.81)
RDW: 13.5 % (ref 11.5–15.5)
WBC: 5.2 10*3/uL (ref 4.0–10.5)

## 2020-05-04 LAB — COMPREHENSIVE METABOLIC PANEL
ALT: 19 U/L (ref 0–53)
AST: 18 U/L (ref 0–37)
Albumin: 4.2 g/dL (ref 3.5–5.2)
Alkaline Phosphatase: 74 U/L (ref 39–117)
BUN: 14 mg/dL (ref 6–23)
CO2: 28 mEq/L (ref 19–32)
Calcium: 9.6 mg/dL (ref 8.4–10.5)
Chloride: 101 mEq/L (ref 96–112)
Creatinine, Ser: 0.96 mg/dL (ref 0.40–1.50)
GFR: 99.96 mL/min (ref >60.00)
Glucose, Bld: 166 mg/dL — ABNORMAL HIGH (ref 70–99)
Potassium: 4 mEq/L (ref 3.5–5.1)
Sodium: 135 mEq/L (ref 135–145)
Total Bilirubin: 0.5 mg/dL (ref 0.2–1.2)
Total Protein: 7.5 g/dL (ref 6.0–8.3)

## 2020-05-04 MED ORDER — HYOSCYAMINE SULFATE 0.125 MG SL SUBL: 0.1250 mg | SUBLINGUAL_TABLET | Freq: Four times a day (QID) | SUBLINGUAL | 0 refills | Status: DC | PRN

## 2020-05-04 NOTE — Progress Notes (Signed)
05/04/2020 Paul [name] [id number] [birth year]   Chief Complaint: follow up idiopathic necrotizing pancreatitis   History of Present Illness: Paul [name] is a 40 year old male with a past medical history of hypertension, hypercholesterolemia, DM II, left kidney cancer s/p left nephrectomy 2011, elevated uric acid levels, migraine headaches, pneumonia, GERD and pancreatitis. Admitted 8/6 - 11/18/17 with acute idiopathic necrotizing pancreatitis with pseudocysts treated with IV Meropenem. Normal IgG4 and triglyceride levels. SPINK1 negative. Negative cystic fibrosis mutation. No history of alcohol abuse. Pancreatic insufficiency confirmed by a low pancreatic elastase level treated wtih Creon. He was last seen in office by Dr. Rush Landmark on 03/31/2019, at that time his abdominal bloat has significantly improved without significant postprandial abdominal pain and he was tolerating Creon resulting in a normal bowel movement 1-2 times daily.  He was advised to schedule an abdominal MRI with MRCP in 4 months to reassess his pancreas and to rule out pancreatic duct stricturing was not done.  He was referred to a general surgeon Dr. Dossie Der at [facility] to consider an elective cholecystectomy in setting of past idiopathic necrotizing pancreatitis.  He stated his ultrasound done at [facility] 06/2019 was normal therefore a cholecystectomy was not pursued. I was not able to access the consult with Dr. Dossie Der at [facility] dated 07/11/2019, the document would not download at this time.  He has intermittent episodes of abdominal bloat vomiting.  He had one episode of abdominal bloat which occurred after overeating and he vomited clear emesis mid December 2021.  He reported having similar symptoms of upper abdominal bloat with vomiting clear emesis once weekly x3 episodes in January 2022.  One of these episodes possibly occurred after he ate cashews.  He used Gas-X in the past when he felt the gas build up which made him feel worse.  He is  passing a normal formed brown stool once daily, sometimes his stools are hard.  No rectal bleeding.  No black stools.  No floating or oily stools. His paternal uncle had EtOH related pancreatitis.  His weight is stable.  No dysphagia or heartburn.  No other complaints today.  Laboratory studies 02/07/2020: WBC 6.2.  Hemoglobin 13.8.  Hematocrit 41.3.  MCV 83.9.  Platelet 214.  Sodium 135.  Potassium 4.2.  BUN 14.  Creatinine 1.06.  Glucose 181.  Calcium 9.1.  Total protein 7.2.  Albumin 4.4.  Total bili 0.7.  Alk phos 84.  AST 17.  ALT 16.  Triglycerides 171.   Gallbladder ultrasound 07/23/2019 at [facility]: Gallbladder: Normal  Stones: None  Wall thickness: 1 mm  Sonographic Murphy's Sign: Negative  Common bile duct diameter: 4 mm  Additional Pertinent Findings: Technically difficult exam due to increased  overlying bowel gas.    CTAP w/wo contrast 02/03/2019: 1. Subacute to chronic changes of necrotizing pancreatitis involving the head, neck, body, and proximal tail of pancreas noted. There is normally enhancing pancreatic tissue remaining involving the distal tail and uncinate process of pancreas. 2. Resolution of previous fluid collection within tail of pancreas. No new fluid collections identified at this time.  Current Outpatient Medications on File Prior to Visit  Medication Sig Dispense Refill  . allopurinol (ZYLOPRIM) 100 MG tablet Take 100 mg by mouth 2 (two) times daily.  1  . amLODipine (NORVASC) 5 MG tablet Take 5 mg by mouth at bedtime.    Marland Kitchen atenolol (TENORMIN) 50 MG tablet Take 50 mg by mouth daily.    . blood glucose meter kit and supplies Dispense  based on patient and insurance preference. Use up to four times daily as directed. (FOR ICD-10 E10.9, E11.9). 1 each 0  . CREON 36000 units CPEP capsule TAKE 2 CAPSULES WITH EACH MEAL. TAKE 1 CAPSULE WITH EACH SNACK.( UP TO 2 SNACKS DAILY.) 720 capsule 3  . insulin aspart (NOVOLOG) 100 UNIT/ML FlexPen Before each meal 3 times a day,  140-199 - 2 units, 200-250 - 4 units, 251-299 - 6 units,  300-349 - 8 units,  350 or above 10 units. Insulin PEN if approved, provide syringes and needles if needed. (Patient taking differently: Inject 2-4 Units into the skin 3 (three) times daily before meals.) 15 mL 0  . Insulin Pen Needle 31G X 5 MM MISC For insulin injection, please provide a month supply. 100 each 0  . LEVEMIR FLEXTOUCH 100 UNIT/ML Pen Inject 8 Units into the skin at bedtime. (Patient taking differently: Inject 24 Units into the skin at bedtime.)    . Vitamin D, Ergocalciferol, (DRISDOL) 1.25 MG (50000 UNIT) CAPS capsule Take 50,000 Units by mouth once a week.     No current facility-administered medications on file prior to visit.   Allergies  Allergen Reactions  . Honey Diarrhea and Nausea And Vomiting  . Other Rash    EGGPLANT    Current Medications, Allergies, Past Medical History, Past Surgical History, Family History and Social History were reviewed in [employer] record.   Review of Systems:   Constitutional: Negative for fever, sweats, chills or weight loss.  Respiratory: Negative for shortness of breath.   Cardiovascular: Negative for chest pain, palpitations and leg swelling.  Gastrointestinal: See HPI.  Musculoskeletal: Negative for back pain or muscle aches.  Neurological: Negative for dizziness, headaches or paresthesias.    Physical Exam: BP 100/70 (BP Location: Left Arm, Patient Position: Sitting, Cuff Size: Normal)   Pulse (!) 56   Ht 5' 9.69" (1.77 m) Comment: height measured without shoes  BMI 24.63 kg/m   Wt Readings from Last 3 Encounters:  03/31/19 170 lb 2 oz (77.2 kg)  11/26/18 164 lb 12.8 oz (74.8 kg)  03/23/18 161 lb (73 kg)   General: Well developed 40 year old male in no acute distress. Head: Normocephalic and atraumatic. Eyes: No scleral icterus. Conjunctiva pink . Ears: Normal auditory acuity. Mouth: Dentition intact. No ulcers or lesions.  Lungs:  Clear throughout to auscultation. Heart: Regular rate and rhythm, no murmur. Abdomen: Soft, nontender and nondistended. No masses or hepatomegaly. Normal bowel sounds x 4 quadrants.  Rectal: Deferred.  Musculoskeletal: Symmetrical with no gross deformities. Extremities: No edema. Neurological: Alert oriented x 4. No focal deficits.  Psychological: Alert and cooperative. Normal mood and affect  Assessment and Recommendations: 26. 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  Intermittent episodes of upper abdominal bloat with vomiting consisting of clear emesis, 1 episode 02/2020 and 3 episodes 03/2020.  -CBC and CMP -Abdominal MRI with MRCP w/wo contrast.  BUN and creatinine level ordered and results to be reviewed prior to the patient receiving IV contrast -Continue Creon as previously prescribed -Recommendations to be determined after the above lab and MRI results received -Patient to follow-up in the office with Dr. Rush Landmark in 6 months and as needed  2.  History of renal cell carcinoma status post left nephrectomy 2011 with normal renal function.   3. Diabetes on insulin  -Continue follow-up with endocrinology

## 2020-05-04 NOTE — Patient Instructions (Addendum)
If you are age 39 or younger, your body mass index should be between 19-25. Your Body mass index is 24.63 kg/m. If this is out of the aformentioned range listed, please consider follow up with your Primary Care Provider.   LABS:   Your provider has requested that you go to the basement level for lab work before leaving today. Press "B" on the elevator. The lab is located at the first door on the left as you exit the elevator.  HEALTHCARE LAWS AND MY CHART RESULTS: Due to recent changes in healthcare laws, you may see the results of your imaging and laboratory studies on MyChart before your provider has had a chance to review them.   We understand that in some cases there may be results that are confusing or concerning to you. Not all laboratory results come back in the same time frame and the provider may be waiting for multiple results in order to interpret others.  Please give [place] 48 hours in order for your provider to thoroughly review all the results before contacting the office for clarification of your results.   MRI  You have been scheduled for an MRI at [facility] on 05/17/20. Your appointment time is 4:00 pm.   Please arrive at 3:30 PM registration purposes. Please make certain not to have anything to eat or drink 4 hours prior to your test. In addition, if you have any metal in your body, have a pacemaker or defibrillator, please be sure to let your ordering physician know. This test typically takes 45 minutes to 1 hour to complete. Should you need to reschedule, please call [phone] to do so.   MEDICATION  We have sent the following medication to your pharmacy for you to pick up at your convenience:  hyoscyamine (LEVSIN SL) 0.125 MG SL tablet. Place 1 tab under the tongue every 6-8 hours as needed for abdominal pain.  Please follow up with Dr. Rush Landmark in 6 months.  It was great seeing you today!  Thank you for entrusting me with your care and choosing  [facility].  Noralyn Pick, CRNP  Low-FODMAP Eating Plan  FODMAP stands for fermentable oligosaccharides, disaccharides, monosaccharides, and polyols. These are sugars that are hard for some people to digest. A low-FODMAP eating plan may help some people who have irritable bowel syndrome (IBS) and certain other bowel (intestinal) diseases to manage their symptoms. This meal plan can be complicated to follow. Work with a diet and nutrition specialist (dietitian) to make a low-FODMAP eating plan that is right for you. A dietitian can help make sure that you get enough nutrition from this diet. What are tips for following this plan? Reading food labels  Check labels for hidden FODMAPs such as: ? High-fructose syrup. ? Honey. ? Agave. ? Natural fruit flavors. ? Onion or garlic powder.  Choose low-FODMAP foods that contain 3-4 grams of fiber per serving.  Check food labels for serving sizes. Eat only one serving at a time to make sure FODMAP levels stay low. Shopping  Shop with a list of foods that are recommended on this diet and make a meal plan. Meal planning  Follow a low-FODMAP eating plan for up to 6 weeks, or as told by your health care provider or dietitian.  To follow the eating plan: 1. Eliminate high-FODMAP foods from your diet completely. Choose only low-FODMAP foods to eat. You will do this for 2-6 weeks. 2. Gradually reintroduce high-FODMAP foods into your diet  one at a time. Most people should wait a few days before introducing the next new high-FODMAP food into their meal plan. Your dietitian can recommend how quickly you may reintroduce foods. 3. Keep a daily record of what and how much you eat and drink. Make note of any symptoms that you have after eating. 4. Review your daily record with a dietitian regularly to identify which foods you can eat and which foods you should avoid. General tips  Drink enough fluid each day to keep your urine pale  yellow.  Avoid processed foods. These often have added sugar and may be high in FODMAPs.  Avoid most dairy products, whole grains, and sweeteners.  Work with a dietitian to make sure you get enough fiber in your diet.  Avoid high FODMAP foods at meals to manage symptoms. Recommended foods Fruits Bananas, oranges, tangerines, lemons, limes, blueberries, raspberries, strawberries, grapes, cantaloupe, honeydew melon, kiwi, papaya, passion fruit, and pineapple. Limited amounts of dried cranberries, banana chips, and shredded coconut. Vegetables Eggplant, zucchini, cucumber, peppers, green beans, bean sprouts, lettuce, arugula, kale, Swiss chard, spinach, collard greens, bok choy, summer squash, potato, and tomato. Limited amounts of corn, carrot, and sweet potato. Green parts of scallions. Grains Gluten-free grains, such as rice, oats, buckwheat, quinoa, corn, polenta, and millet. Gluten-free pasta, bread, or cereal. Rice noodles. Corn tortillas. Meats and other proteins Unseasoned beef, pork, poultry, or fish. Eggs. Berniece Salines. Tofu (firm) and tempeh. Limited amounts of nuts and seeds, such as almonds, walnuts, [place] nuts, pecans, peanuts, nut butters, pumpkin seeds, chia seeds, and sunflower seeds. Dairy Lactose-free milk, yogurt, and kefir. Lactose-free cottage cheese and ice cream. Non-dairy milks, such as almond, coconut, hemp, and rice milk. Non-dairy yogurt. Limited amounts of goat cheese, brie, mozzarella, parmesan, swiss, and other hard cheeses. Fats and oils Butter-free spreads. Vegetable oils, such as olive, canola, and sunflower oil. Seasoning and other foods Artificial sweeteners with names that do not end in "ol," such as aspartame, saccharine, and stevia. Maple syrup, white table sugar, raw sugar, brown sugar, and molasses. Mayonnaise, soy sauce, and tamari. Fresh basil, coriander, parsley, rosemary, and thyme. Beverages Water and mineral water. Sugar-sweetened soft drinks. Small  amounts of orange juice or cranberry juice. Black and green tea. Most dry wines. Coffee. The items listed above may not be a complete list of foods and beverages you can eat. Contact a dietitian for more information. Foods to avoid Fruits Fresh, dried, and juiced forms of apple, pear, watermelon, peach, plum, cherries, apricots, blackberries, boysenberries, figs, nectarines, and mango. Avocado. Vegetables Chicory root, artichoke, asparagus, cabbage, snow peas, Brussels sprouts, broccoli, sugar snap peas, mushrooms, celery, and cauliflower. Onions, garlic, leeks, and the white part of scallions. Grains Wheat, including kamut, durum, and semolina. Barley and bulgur. Couscous. Wheat-based cereals. Wheat noodles, bread, crackers, and pastries. Meats and other proteins Fried or fatty meat. Sausage. Cashews and pistachios. Soybeans, baked beans, black beans, chickpeas, kidney beans, fava beans, navy beans, lentils, black-eyed peas, and split peas. Dairy Milk, yogurt, ice cream, and soft cheese. Cream and sour cream. Milk-based sauces. Custard. Buttermilk. Soy milk. Seasoning and other foods Any sugar-free gum or candy. Foods that contain artificial sweeteners such as sorbitol, mannitol, isomalt, or xylitol. Foods that contain honey, high-fructose corn syrup, or agave. Bouillon, vegetable stock, beef stock, and chicken stock. Garlic and onion powder. Condiments made with onion, such as hummus, chutney, pickles, relish, salad dressing, and salsa. Tomato paste. Beverages Chicory-based drinks. Coffee substitutes. Chamomile tea. Fennel tea. Sweet or fortified  wines such as port or sherry. Diet soft drinks made with isomalt, mannitol, maltitol, sorbitol, or xylitol. Apple, pear, and mango juice. Juices with high-fructose corn syrup. The items listed above may not be a complete list of foods and beverages you should avoid. Contact a dietitian for more information. Summary  FODMAP stands for fermentable  oligosaccharides, disaccharides, monosaccharides, and polyols. These are sugars that are hard for some people to digest.  A low-FODMAP eating plan is a short-term diet that helps to ease symptoms of certain bowel diseases.  The eating plan usually lasts up to 6 weeks. After that, high-FODMAP foods are reintroduced gradually and one at a time. This can help you find out which foods may be causing symptoms.  A low-FODMAP eating plan can be complicated. It is best to work with a dietitian who has experience with this type of plan. This information is not intended to replace advice given to you by your health care provider. Make sure you discuss any questions you have with your health care provider. Document Revised: 08/04/2019 Document Reviewed: 08/04/2019 Elsevier Patient Education  [place].

## 2020-05-05 NOTE — Progress Notes (Signed)
Attending Physician's Attestation   I have reviewed the chart.   I agree with the Advanced Practitioner's note, impression, and recommendations with any updates as below.    Mariyana Fulop Mansouraty, MD [facility] Advanced [facility]oscopy Office # [phone]  

## 2020-05-17 ENCOUNTER — Ambulatory Visit (HOSPITAL_COMMUNITY): Payer: 59

## 2020-05-24 IMAGING — CT CT ABD-PELV W/ CM
2 of 4 series · 15 of 46 positions shown, 17 images · IV contrast (APPLIED)
Comparison: 11/04/2017

CLINICAL DATA: Pancreatitis.

EXAM:
CT ABDOMEN AND PELVIS WITH CONTRAST
TECHNIQUE: Multidetector CT imaging of the abdomen and pelvis was performed
using the standard protocol following bolus administration of
intravenous contrast.
CONTRAST:  75mL [id number]-GGG IOPAMIDOL ([id number]-GGG) INJECTION 61%,
<See Chart> [id number]-GGG IOPAMIDOL ([id number]-GGG) INJECTION 61%

[Series 3: abd/ pelvis 5.0 i30f 2 · axial · 0.75mm/px · z∈[-588,-118]mm · 12 of 112 slices shown, 14 images]
[im 9/112  soft-tissue]
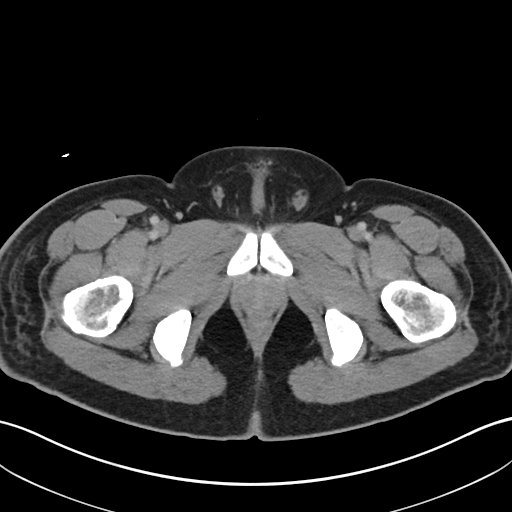
[im 9/112  bone]
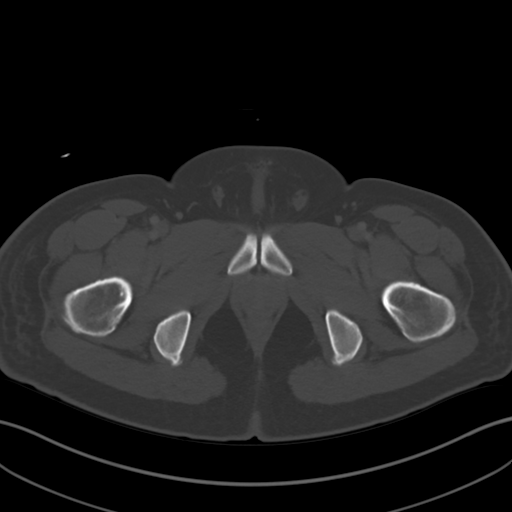
[im 18/112  soft-tissue]
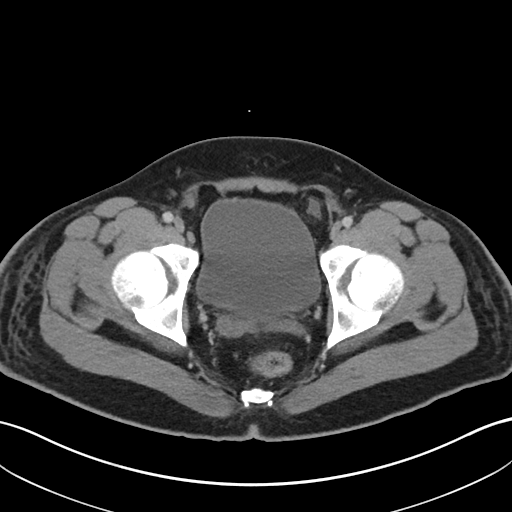
[im 26/112  soft-tissue]
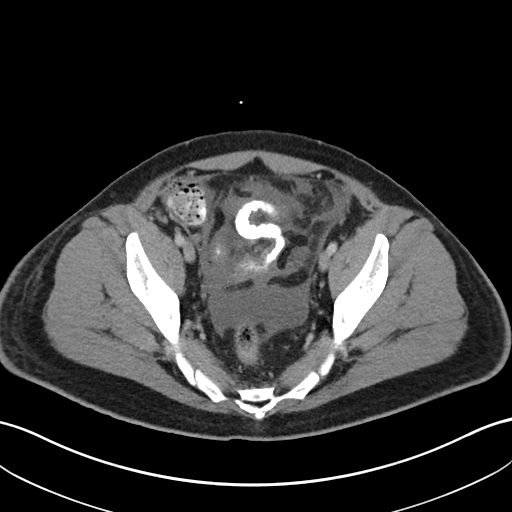
[im 35/112  soft-tissue]
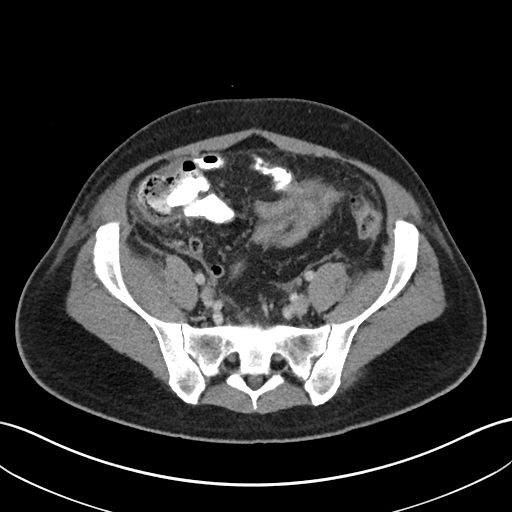
[im 43/112  soft-tissue]
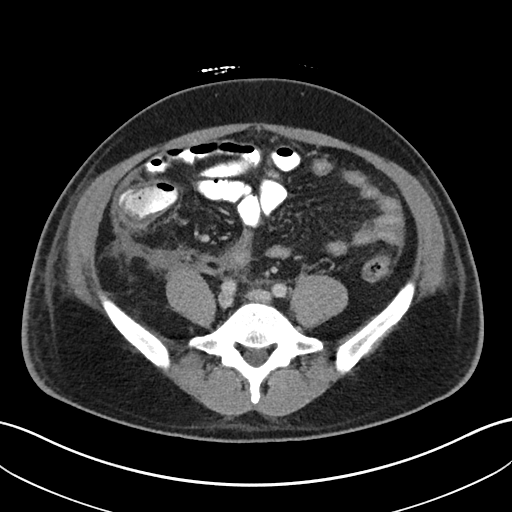
[im 52/112  soft-tissue]
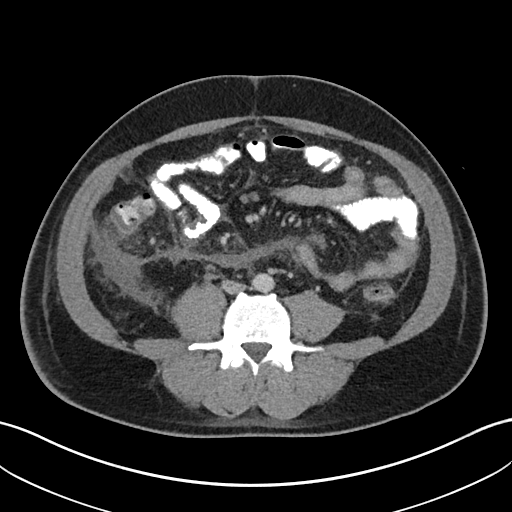
[im 60/112  soft-tissue]
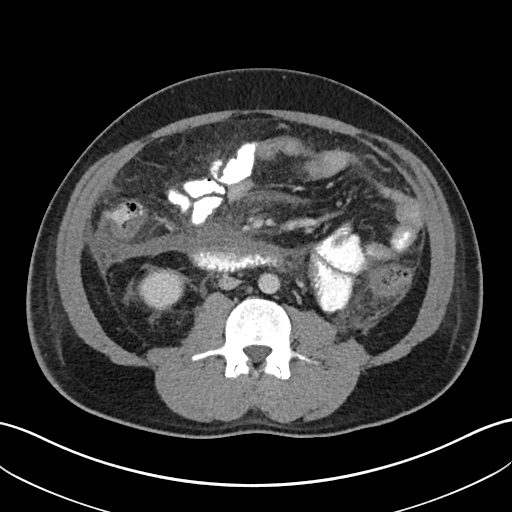
[im 69/112  soft-tissue]
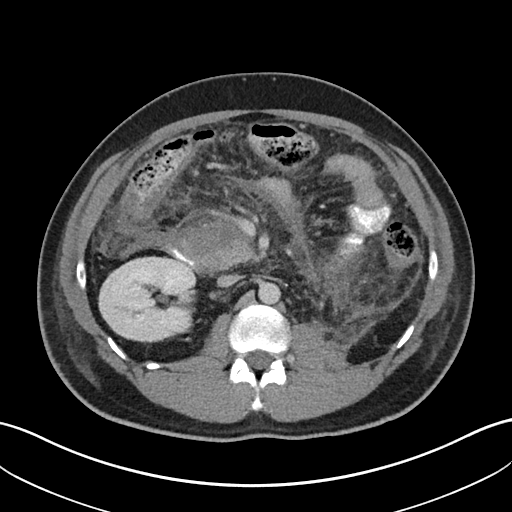
[im 77/112  soft-tissue]
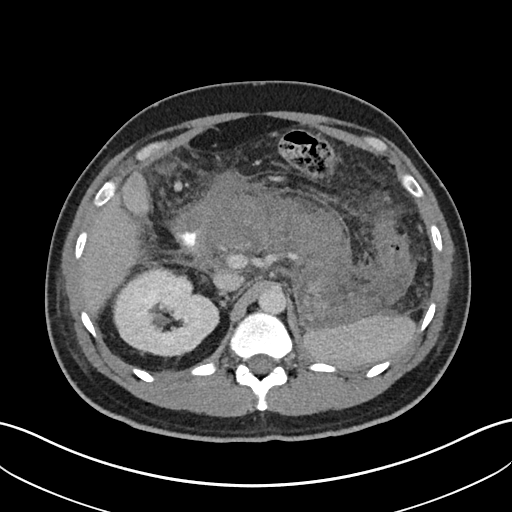
[im 77/112  bone]
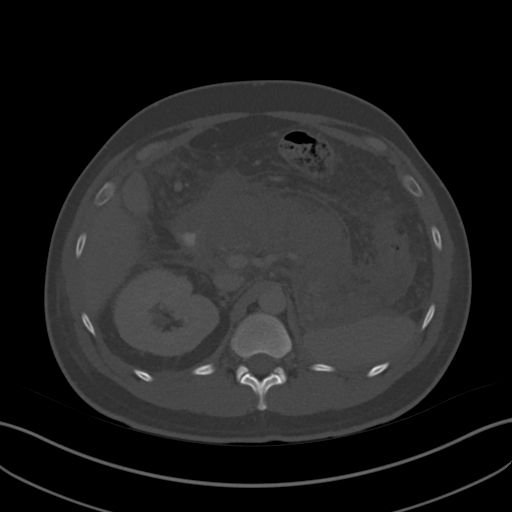
[im 86/112  soft-tissue]
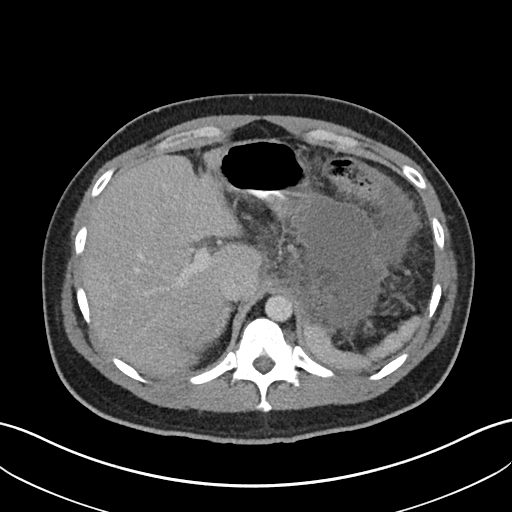
[im 94/112  soft-tissue]
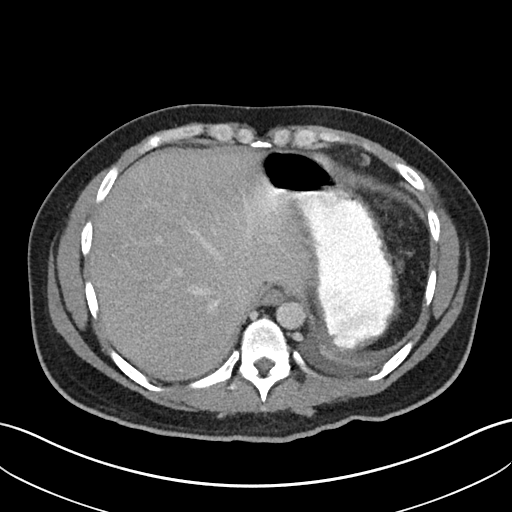
[im 103/112  soft-tissue]
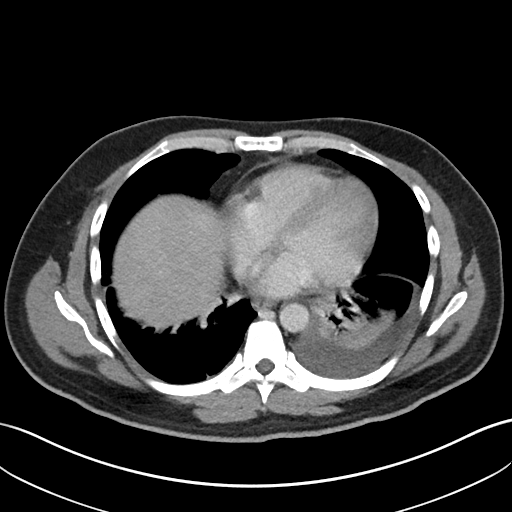

[Series 6: coronal soft tissue · coronal · 0.85mm/px · 3 of 101 slices shown]
[im 34/101  soft-tissue]
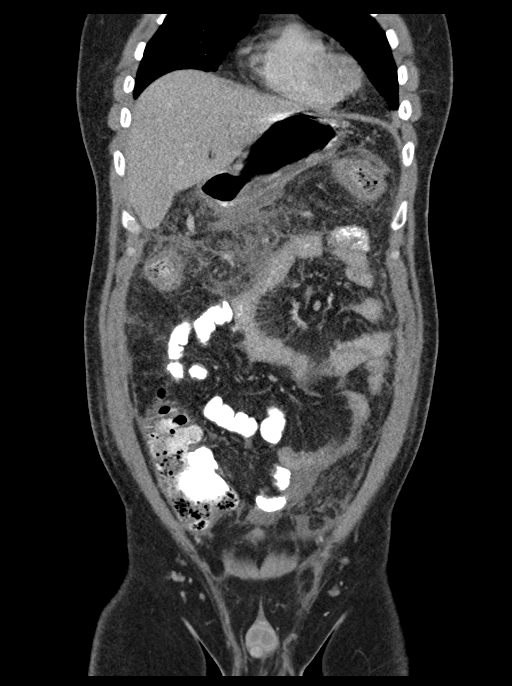
[im 45/101  soft-tissue]
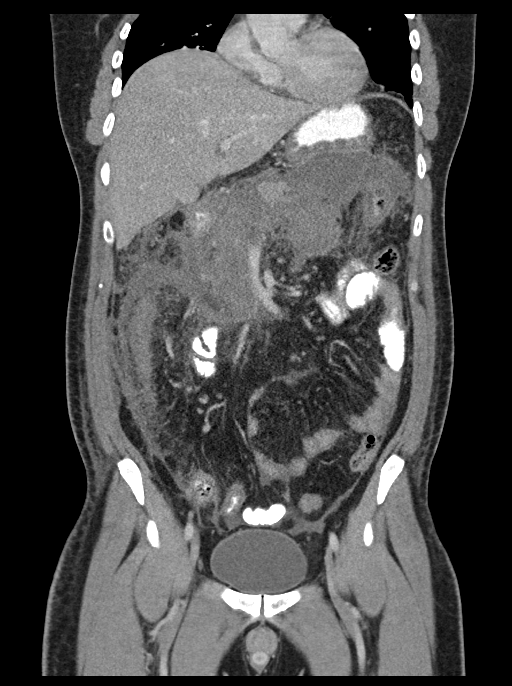
[im 56/101  soft-tissue]
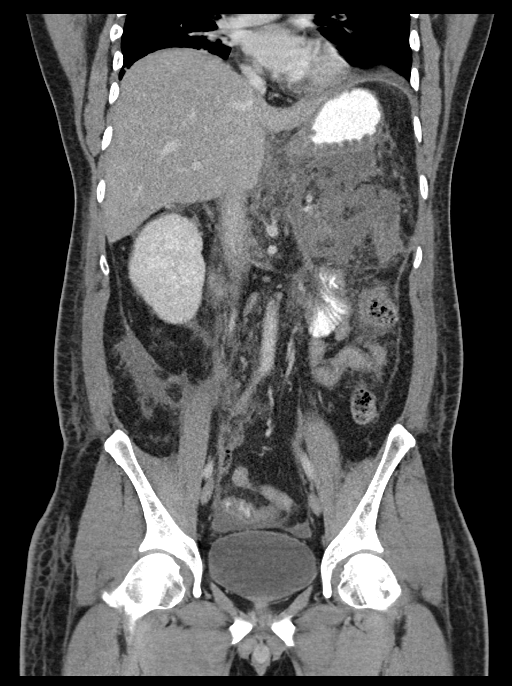

[15 of 46 positions shown; findings below may reference images not displayed]

FINDINGS: Lower chest: Small LEFT effusion and bibasilar atelectasis. Pleural
effusions new from prior

Hepatobiliary: No focal hepatic lesion. Gallbladder normal. No
biliary duct dilatation.

Pancreas: Severe edema of the pancreas. The pancreatic parenchyma is
poorly defined with out normal enhancement. The degree pancreatic
tissue definition is decreased in interval. Findings are concerning
for necrotizing pancreatitis. A scant normal in enhancing pancreatic
tissue is noted the head (image 43/3 in the midbody a small portion
the tail. Pancreatic duct is not imaged.

There is a beginnings organized fluid collection positioned between
the greater curvature the stomach and the pancreas with a thin
enhancing rim (image 47/6.

Fluid extends along the LEFT para RIGHT pericolic gutter and pelvis
without organization.

There is no clear vascular complication associated pancreatitis.
Portal veins are patent. Splenic vein is small but patent.

Spleen: Normal spleen

Adrenals/urinary tract: Adrenal glands normal. RIGHT kidney normal.
Post LEFT nephrectomy.

Stomach/Bowel: Stomach, and duodenum, small-bowel appendix and cecum
normal. Colon and rectosigmoid colon normal. There is some bowel
wall edema involving the splenic flexure of the colon related to
pancreatitis per

Vascular/Lymphatic: Abdominal aorta is normal caliber. There is no
retroperitoneal or periportal lymphadenopathy. No pelvic
lymphadenopathy.

Reproductive: Prostate normal

Other: No free fluid.

Musculoskeletal: No aggressive osseous lesion.
IMPRESSION: 1. Acute pancreatitis is worsened with poor definition of the
pancreatic parenchyma highly concerning for PANCREATIC NECROSIS.
2. Increased organization of fluid collection between the pancreas
and stomach consists with early pseudocyst formation.
3. New pleural effusion.
4. Persistent fluid extending along pericolic gutters into the
pelvis not changed.
5. Local bowel edema of the colon splenic flexure related to the
adjacent pancreatitis.

These results will be called to the ordering clinician or
representative by the Radiologist Assistant, and communication
documented in the PACS or zVision Dashboard.

## 2020-05-25 IMAGING — DX DG CHEST 1V PORT
1 series · 1 of 1 positions shown · non-contrast
Comparison: November 07, 2017

CLINICAL DATA: Shortness of breath with elevated white blood cell
count

EXAM:
PORTABLE CHEST 1 VIEW

[chest]
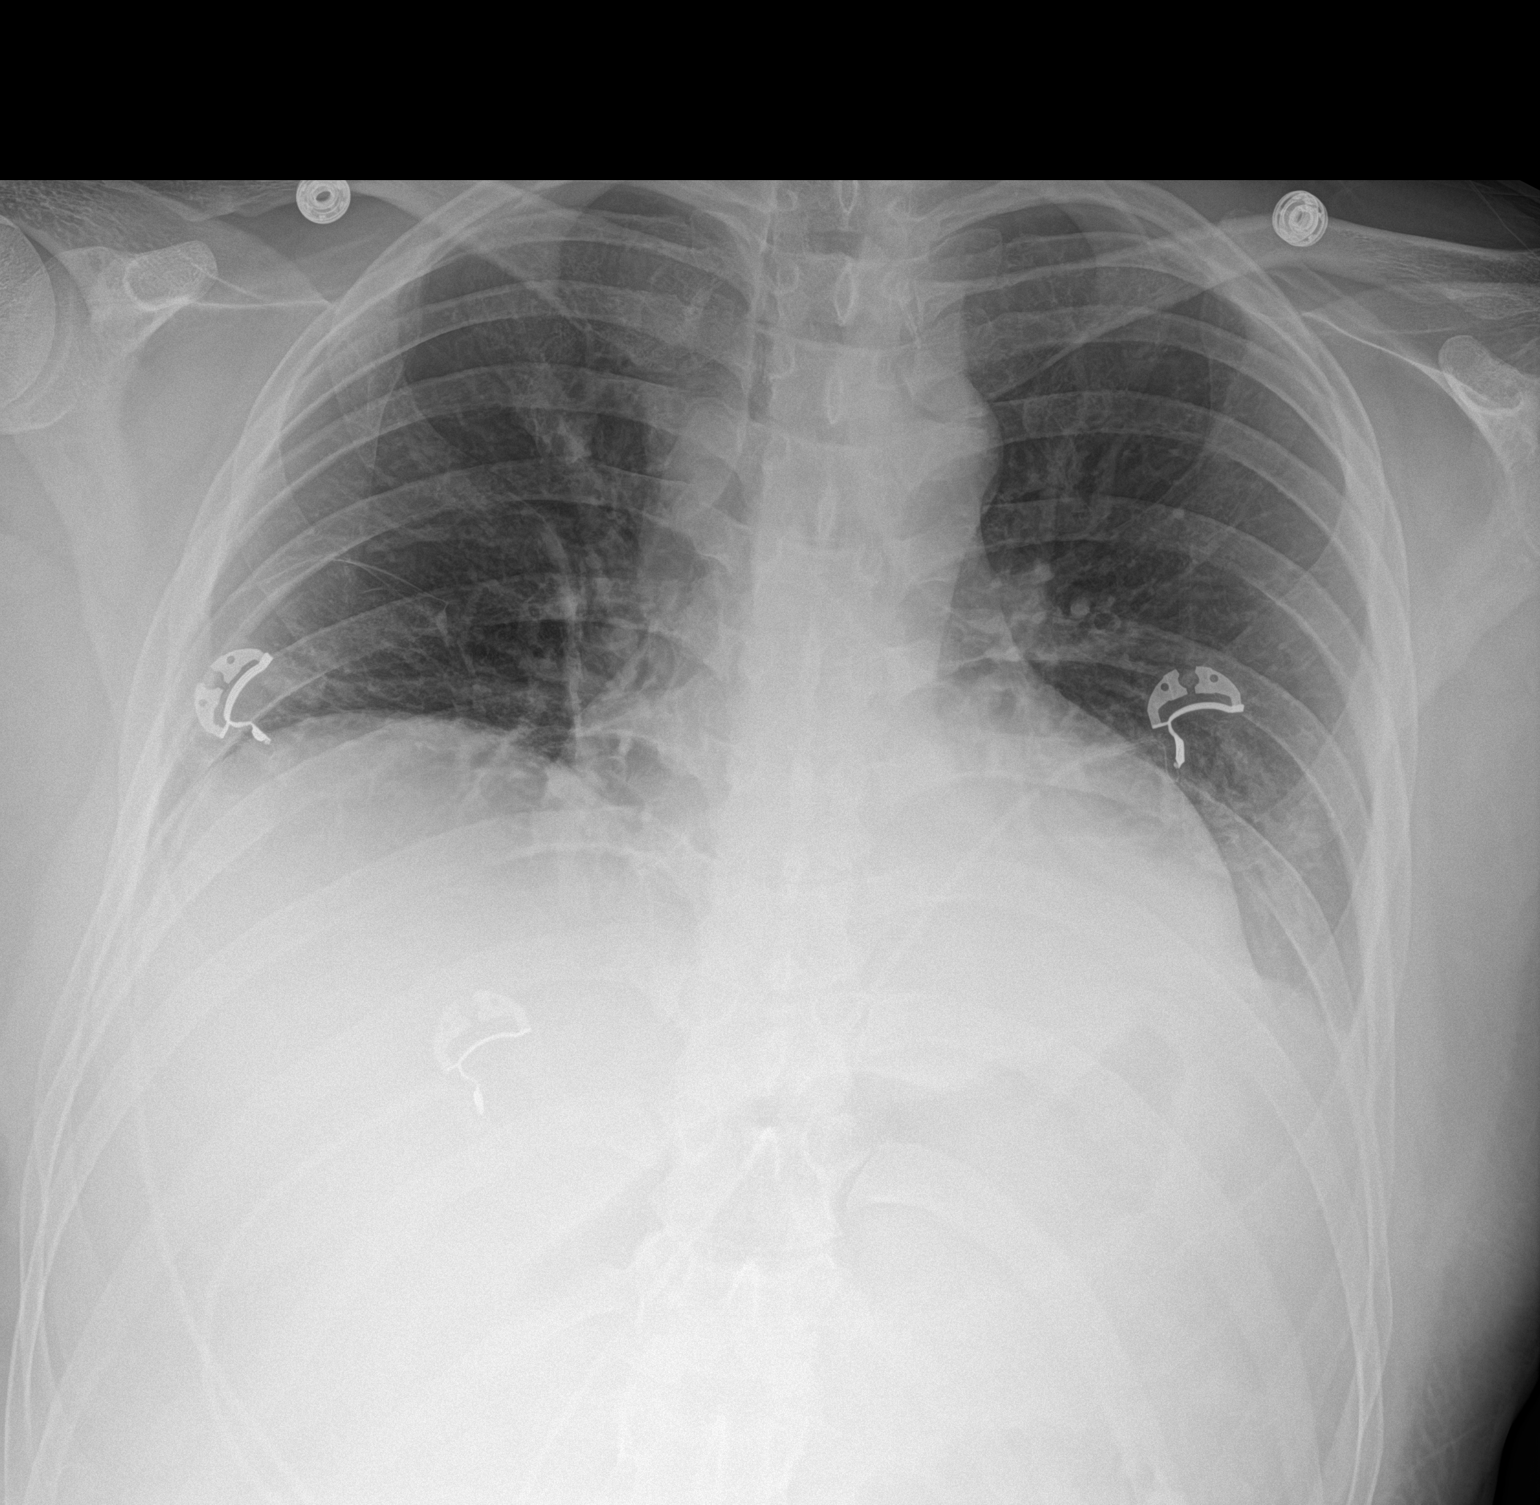

[1 of 1 positions shown; findings below may reference images not displayed]

FINDINGS: There is patchy consolidation in the left base with small left
pleural effusion. There is mild right base atelectasis. Lungs
elsewhere are clear. Heart is borderline enlarged with pulmonary
vascularity normal. No adenopathy. No bone lesions.
IMPRESSION: Patchy consolidation left base felt to represent focal pneumonia.
Small left pleural effusion. Right base atelectasis. Mild cardiac
prominence.

## 2020-05-29 ENCOUNTER — Other Ambulatory Visit: Payer: Self-pay

## 2020-05-29 ENCOUNTER — Encounter (HOSPITAL_COMMUNITY): Payer: Self-pay

## 2020-05-29 ENCOUNTER — Other Ambulatory Visit (HOSPITAL_COMMUNITY): Payer: Self-pay | Admitting: Nurse Practitioner

## 2020-05-29 ENCOUNTER — Ambulatory Visit (HOSPITAL_COMMUNITY)
Admission: RE | Admit: 2020-05-29 | Discharge: 2020-05-29 | Disposition: A | Discharge status: Home / Self Care | Payer: 59 | Source: Ambulatory Visit | Attending: Nurse Practitioner | Admitting: Nurse Practitioner

## 2020-05-29 DIAGNOSIS — K861 Other chronic pancreatitis: Secondary | ICD-10-CM | POA: Diagnosis present

## 2020-05-29 DIAGNOSIS — K863 Pseudocyst of pancreas: Secondary | ICD-10-CM | POA: Diagnosis not present

## 2020-05-29 DIAGNOSIS — Z8719 Personal history of other diseases of the digestive system: Secondary | ICD-10-CM | POA: Diagnosis present

## 2020-05-29 MED ORDER — GADOBUTROL 1 MMOL/ML IV SOLN
8.0000 mL | Freq: Once | INTRAVENOUS | Status: AC | PRN
  Administered 2020-05-29: 8 mL via INTRAVENOUS

## 2020-07-27 ENCOUNTER — Other Ambulatory Visit: Payer: Self-pay | Admitting: Gastroenterology

## 2020-10-30 ENCOUNTER — Ambulatory Visit: Payer: 59 | Admitting: Gastroenterology

## 2020-10-30 ENCOUNTER — Encounter: Payer: Self-pay | Admitting: Gastroenterology

## 2020-10-30 VITALS — BP 122/74 | HR 64 | Ht 70.0 in | Wt 174.0 lb

## 2020-10-30 DIAGNOSIS — K862 Cyst of pancreas: Secondary | ICD-10-CM | POA: Diagnosis not present

## 2020-10-30 DIAGNOSIS — Z8719 Personal history of other diseases of the digestive system: Secondary | ICD-10-CM | POA: Diagnosis not present

## 2020-10-30 DIAGNOSIS — K8681 Exocrine pancreatic insufficiency: Secondary | ICD-10-CM

## 2020-10-30 DIAGNOSIS — R141 Gas pain: Secondary | ICD-10-CM

## 2020-10-30 DIAGNOSIS — R140 Abdominal distension (gaseous): Secondary | ICD-10-CM

## 2020-10-30 NOTE — Patient Instructions (Signed)
If you are age 39 or younger, your body mass index should be between 19-25. Your Body mass index is 24.97 kg/m. If this is out of the aformentioned range listed, please consider follow up with your Primary Care Provider.   __________________________________________________________  The  GI providers would like to encourage you to use [facility] to communicate with providers for non-urgent requests or questions.  Due to long hold times on the telephone, sending your provider a message by [facility] may be a faster and more efficient way to get a response.  Please allow 48 business hours for a response.  Please remember that this is for non-urgent requests.   Low-FODMAP Eating Plan  FODMAP stands for fermentable oligosaccharides, disaccharides, monosaccharides, and polyols. These are sugars that are hard for some people to digest. A low-FODMAP eating plan may help some people who have irritable bowel syndrome (IBS) and certain other bowel (intestinal) diseases to manage their symptoms. This meal plan can be complicated to follow. Work with a diet and nutrition specialist (dietitian) to make a low-FODMAP eating plan that is right for you. A dietitian can helpmake sure that you get enough nutrition from this diet. What are tips for following this plan? Reading food labels Check labels for hidden FODMAPs such as: High-fructose syrup. Honey. Agave. Natural fruit flavors. Onion or garlic powder. Choose low-FODMAP foods that contain 3-4 grams of fiber per serving. Check food labels for serving sizes. Eat only one serving at a time to make sure FODMAP levels stay low. Shopping Shop with a list of foods that are recommended on this diet and make a meal plan. Meal planning Follow a low-FODMAP eating plan for up to 6 weeks, or as told by your health care provider or dietitian. To follow the eating plan: Eliminate high-FODMAP foods from your diet completely. Choose only low-FODMAP foods to eat. You  will do this for 2-6 weeks. Gradually reintroduce high-FODMAP foods into your diet one at a time. Most people should wait a few days before introducing the next new high-FODMAP food into their meal plan. Your dietitian can recommend how quickly you may reintroduce foods. Keep a daily record of what and how much you eat and drink. Make note of any symptoms that you have after eating. Review your daily record with a dietitian regularly to identify which foods you can eat and which foods you should avoid. General tips Drink enough fluid each day to keep your urine pale yellow. Avoid processed foods. These often have added sugar and may be high in FODMAPs. Avoid most dairy products, whole grains, and sweeteners. Work with a dietitian to make sure you get enough fiber in your diet. Avoid high FODMAP foods at meals to manage symptoms. Recommended foods Fruits Bananas, oranges, tangerines, lemons, limes, blueberries, raspberries, strawberries, grapes, cantaloupe, honeydew melon, kiwi, papaya, passion fruit, and pineapple. Limited amounts of dried cranberries, banana chips, and shreddedcoconut. Vegetables Eggplant, zucchini, cucumber, peppers, green beans, bean sprouts, lettuce, arugula, kale, Swiss chard, spinach, collard greens, bok choy, summer squash, potato, and tomato. Limited amounts of corn, carrot, and sweet potato. Greenparts of scallions. Grains Gluten-free grains, such as rice, oats, buckwheat, quinoa, corn, polenta, andmillet. Gluten-free pasta, bread, or cereal. Rice noodles. Corn tortillas. Meats and other proteins Unseasoned beef, pork, poultry, or fish. Eggs. Berniece Salines. Tofu (firm) and tempeh. Limited amounts of nuts and seeds, such as almonds, walnuts, [place] nuts,pecans, peanuts, nut butters, pumpkin seeds, chia seeds, and sunflower seeds. Dairy Lactose-free milk, yogurt, and kefir. Lactose-free cottage cheese  and ice cream. Non-dairy milks, such as almond, coconut, hemp, and rice milk.  Non-dairy yogurt. Limited amounts of goat cheese, brie, mozzarella, parmesan, swiss, andother hard cheeses. Fats and oils Butter-free spreads. Vegetable oils, such as olive, canola, and sunflower oil. Seasoning and other foods Artificial sweeteners with names that do not end in "ol," such as aspartame, saccharine, and stevia. Maple syrup, white table sugar, raw sugar, brown sugar, and molasses. Mayonnaise, soy sauce, and tamari. Fresh basil, coriander,parsley, rosemary, and thyme. Beverages Water and mineral water. Sugar-sweetened soft drinks. Small amounts of orangejuice or cranberry juice. Black and green tea. Most dry wines. Coffee. The items listed above may not be a complete list of foods and beverages you can eat. Contact a dietitian for more information. Foods to avoid Fruits Fresh, dried, and juiced forms of apple, pear, watermelon, peach, plum, cherries, apricots, blackberries, boysenberries, figs, nectarines, and mango.Avocado. Vegetables Chicory root, artichoke, asparagus, cabbage, snow peas, Brussels sprouts, broccoli, sugar snap peas, mushrooms, celery, and cauliflower. Onions, garlic,leeks, and the white part of scallions. Grains Wheat, including kamut, durum, and semolina. Barley and bulgur. Couscous.Wheat-based cereals. Wheat noodles, bread, crackers, and pastries. Meats and other proteins Fried or fatty meat. Sausage. Cashews and pistachios. Soybeans, baked beans, black beans, chickpeas, kidney beans, fava beans, navy beans, lentils,black-eyed peas, and split peas. Dairy Milk, yogurt, ice cream, and soft cheese. Cream and sour cream. Milk-basedsauces. Custard. Buttermilk. Soy milk. Seasoning and other foods Any sugar-free gum or candy. Foods that contain artificial sweeteners such as sorbitol, mannitol, isomalt, or xylitol. Foods that contain honey, high-fructose corn syrup, or agave. Bouillon, vegetable stock, beef stock, and chicken stock. Garlic and onion powder. Condiments  made with onion, such ashummus, chutney, pickles, relish, salad dressing, and salsa. Tomato paste. Beverages Chicory-based drinks. Coffee substitutes. Chamomile tea. Fennel tea. Sweet or fortified wines such as port or sherry. Diet soft drinks made with isomalt, mannitol, maltitol, sorbitol, or xylitol. Apple, pear, and mango juice. Juiceswith high-fructose corn syrup. The items listed above may not be a complete list of foods and beverages you should avoid. Contact a dietitian for more information. Summary FODMAP stands for fermentable oligosaccharides, disaccharides, monosaccharides, and polyols. These are sugars that are hard for some people to digest. A low-FODMAP eating plan is a short-term diet that helps to ease symptoms of certain bowel diseases. The eating plan usually lasts up to 6 weeks. After that, high-FODMAP foods are reintroduced gradually and one at a time. This can help you find out which foods may be causing symptoms. A low-FODMAP eating plan can be complicated. It is best to work with a dietitian who has experience with this type of plan. This information is not intended to replace advice given to you by your health care provider. Make sure you discuss any questions you have with your healthcare provider. Document Revised: 08/04/2019 Document Reviewed: 08/04/2019 Elsevier Patient Education  2022 [place].   Follow up MRCP in March 2023.   Thank you for choosing me and [facility].  Dr. Rush Landmark

## 2020-10-30 NOTE — Progress Notes (Signed)
Gas [place]

## 2020-10-31 ENCOUNTER — Encounter: Payer: Self-pay | Admitting: Gastroenterology

## 2020-10-31 NOTE — Progress Notes (Signed)
All  GASTROENTEROLOGY OUTPATIENT CLINIC VISIT   Primary Care Provider Karleen Hampshire., MD [address] [place] [address] [phone]  Patient Profile: Paul [name] is a 40 y.o. 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  The patient presents to the [facility] for an evaluation and management of problem(s) noted below:  Problem List 1. History of necrotizing pancreatitis   2. Gas pain   3. Bloating   4. Pancreatic cyst   5. Exocrine pancreatic insufficiency     History of Present Illness: Please see initial consultation note and prior progress notes by myself and NP [facility] for full details of HPI.   Interval History The patient returns for scheduled follow-up.  He was seen earlier this year by NP [facility] with symptoms of bloating and discomfort.  Interestingly, this occurred 1 year prior around the same time period.  He is not sure if this was a result of food intake (richer foods and not following diet as closely) or what occurred because he has done very well since the clinic visit.  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  Patient overall has been doing relatively well in regards to his diabetes control.  Patient's weight is currently up.  He continues to take Creon as directed.  Has normal bowel movements on a daily basis.    GI Review of Systems Positive as above Negative for dysphagia, odynophagia, pyrosis, nausea, vomiting, pain, alteration of bowel habits, melena, hematochezia   Review of  Systems General: Denies fevers/chills/unintentional weight loss Cardiovascular: Denies chest pain Pulmonary: Denies shortness of breath Gastroenterological: See HPI Genitourinary: Denies darkened urine Dermatological: Denies jaundice Psychological: Mood is stable   Medications Current Outpatient Medications  Medication Sig Dispense Refill   allopurinol (ZYLOPRIM) 100 MG tablet Take 100 mg by mouth 2 (two) times daily.  1   amLODipine (NORVASC) 5 MG tablet Take 5 mg by mouth at bedtime.     atenolol (TENORMIN) 50 MG tablet Take 50 mg by mouth daily.     blood glucose meter kit and supplies Dispense based on patient and insurance preference. Use up to four times daily as directed. (FOR ICD-10 E10.9, E11.9). 1 each 0   CREON 36000-114000 units CPEP capsule TAKE 2 CAPSULES WITH EACH MEAL. TAKE 1 CAPSULE WITH EACH SNACK.( UP TO 2 SNACKS DAILY.) 720 capsule 3   hyoscyamine (LEVSIN SL) 0.125 MG SL tablet Place 1 tablet (0.125 mg total) under the tongue every 6 (six) hours as needed (for abdominal pain). 30 tablet 0   insulin aspart (NOVOLOG) 100 UNIT/ML FlexPen Before each meal 3 times a day, 140-199 - 2 units, 200-250 - 4 units, 251-299 - 6 units,  300-349 - 8 units,  350 or above 10 units. Insulin PEN if approved, provide syringes and needles if needed. (Patient taking differently: Inject 2-4 Units into the skin 3 (three) times daily before meals.) 15 mL 0   Insulin Pen Needle 31G X 5 MM MISC For insulin injection, please provide a month supply. 100 each 0   LEVEMIR FLEXTOUCH 100 UNIT/ML Pen Inject 8 Units into the skin at  bedtime. (Patient taking differently: Inject 24 Units into the skin at bedtime.)     Vitamin D, Ergocalciferol, (DRISDOL) 1.25 MG (50000 UNIT) CAPS capsule Take 50,000 Units by mouth once a week.     No current facility-administered medications for this visit.    Allergies Allergies  Allergen Reactions   Honey Diarrhea and Nausea And Vomiting   Other Rash    EGGPLANT     Histories Past Medical History:  Diagnosis Date   Cancer of kidney ([place]) 2011   "left"   Diabetes mellitus without complication (HCC)    Elevated blood uric acid level    "I take RX for it; Allopurinol" (11/04/2017)   GERD (gastroesophageal reflux disease)    High cholesterol    Hypertension    Migraine    "only in my teens" (11/04/2017)   Pancreatitis    Pneumonia    Past Surgical History:  Procedure Laterality Date   ADRENALECTOMY     left side   BIOPSY  12/11/2017   Procedure: BIOPSY;  Surgeon: Irving Copas., MD;  Location: [facility] ENDOSCOPY;  Service: Gastroenterology;;   ESOPHAGOGASTRODUODENOSCOPY (EGD) WITH PROPOFOL N/A 12/11/2017   Procedure: ESOPHAGOGASTRODUODENOSCOPY (EGD) WITH PROPOFOL;  Surgeon: Irving Copas., MD;  Location: [place];  Service: Gastroenterology;  Laterality: N/A;   ESOPHAGOGASTRODUODENOSCOPY (EGD) WITH PROPOFOL N/A 12/30/2017   Procedure: ESOPHAGOGASTRODUODENOSCOPY (EGD) WITH PROPOFOL Necrosectomy;  Surgeon: Rush Landmark Telford Nab., MD;  Location: [place];  Service: Gastroenterology;  Laterality: N/A;   IR CATHETER TUBE CHANGE  12/30/2017   IR RADIOLOGIST EVAL & MGMT  12/23/2017   IR RADIOLOGIST EVAL & MGMT  01/12/2018   IR RADIOLOGIST EVAL & MGMT  01/26/2018   NEPHRECTOMY Left 2011   "kidney cancer"   UPPER ESOPHAGEAL ENDOSCOPIC ULTRASOUND (EUS) N/A 12/11/2017   Procedure: UPPER ESOPHAGEAL ENDOSCOPIC ULTRASOUND (EUS);  Surgeon: Irving Copas., MD;  Location: [place];  Service: Gastroenterology;  Laterality: N/A;   UPPER GASTROINTESTINAL ENDOSCOPY  02/2017   Social History   Socioeconomic History   Marital status: Married    Spouse name: Not on file   Number of children: Not on file   Years of education: Not on file   Highest education level: Not on file  Occupational History   Not on file  Tobacco Use   Smoking status: Former    Packs/day: 0.50    Years: 11.00    Pack years: 5.50    Types: Cigarettes     Quit date: 2011    Years since quitting: 11.5   Smokeless tobacco: Never  Vaping Use   Vaping Use: Never used  Substance and Sexual Activity   Alcohol use: Yes    Alcohol/week: 4.0 standard drinks    Types: 4 Cans of beer per week    Comment: Occ   Drug use: Never   Sexual activity: Yes  Other Topics Concern   Not on file  Social History Narrative   Married, 1 child, wife expecting   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[employer]   Former smoker   4 beers/week   No drugs   Social Determinants of Radio broadcast assistant Strain: Not on file  Food Insecurity: Not on file  Transportation Needs: Not on file  Physical Activity: Not on file  Stress: Not on file  Social Connections: Not on file  Intimate Partner Violence: Not on file   Family History  Problem Relation Age of Onset   Hypertension Mother    Hypertension Father    Pancreatitis  Paternal Uncle        twice, was a drinker   Colon cancer Neg Hx    Esophageal cancer Neg Hx    Inflammatory bowel disease Neg Hx    Liver disease Neg Hx    Pancreatic cancer Neg Hx    Rectal cancer Neg Hx    Stomach cancer Neg Hx    I have reviewed his medical, social, and family history in detail and updated the electronic medical record as necessary.    PHYSICAL EXAMINATION  BP 122/74   Pulse 64   Ht $R'5\' 10"'GO$  (1.778 m)   Wt 174 lb (78.9 kg)   BMI 24.97 kg/m  GEN: NAD, appears stated age, doesn't appear chronically ill PSYCH: Cooperative, without pressured speech EYE: Conjunctivae pink, sclerae anicteric ENT: Masked NECK: Supple CV: Nontachycardic RESP: No audible wheezing  GI: NABS, soft, NT/ND, without rebound MSK/EXT: No lower extremity edema SKIN: No jaundice NEURO:  Alert & Oriented x 3, no focal deficits,   REVIEW OF DATA  I reviewed the following data at the time of this encounter:  GI Procedures and Studies  No new studies  Laboratory Studies  Reviewed in epic  Imaging Studies  March 2022  MRI/MRCP IMPRESSION: 1. Marked atrophy of the pancreatic parenchyma in the body and tail region. 2.8 x 2.5 cm well-defined cyst in the expected location of the pancreatic tail just medial to the spleen. This could be a chronic pseudocyst. Left adrenal gland is not discretely visible on MRI today and left adrenal cyst, while considered less likely, is not excluded.   ASSESSMENT  Mr. Tay is a 40 y.o. 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  The patient is seen today for evaluation and management of:  1. History of necrotizing pancreatitis   2. Gas pain   3. Bloating   4. Pancreatic cyst   5. Exocrine pancreatic insufficiency    Patient is hemodynamically and clinically stable.  He is doing well on his current PERT therapy.  We will continue that.  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  We will plan to repeat imaging in 1 year from his prior and follow-up in clinic.  Etiology of his episodes of bloating/discomfort are not clearly defined and would not be a result of the cyst size that is noted.  He will monitor his food intake closely around that time.  When he is with his family and during the holidays.  Within the next week the patient will have his 3-year anniversary since the episode of pancreatitis and we hopefully will never happen again.  There was consideration at some point to think about gallbladder being removed but after evaluation at [facility] decision was made to hold on gallbladder resection.  If the cyst were to increase in size then the possibility of a pancreatic duct disruption should be considered.  If symptoms progress significantly and imaging shows increase in size of the cyst then we will need to consider the role of pancreatic ERCP.  We will see him next year as long as he  is doing well.  All patient questions were answered to the best of my ability, and the patient agrees to the aforementioned plan of action with follow-up as indicated.   PLAN  MRI/MRCP in 1 year for follow-up of pancreatic chronic pseudocyst Unless there  is significant increase in size of this lesion or symptoms develop where we note this is enlarging no pancreatic ERCP Continue Creon 72,000 units with each meal and 36,000 units with each snack Heart healthy low-fat diet to be continued   No orders of the defined types were placed in this encounter.   New Prescriptions   No medications on file   Modified Medications   No medications on file    Planned Follow Up: No follow-ups on file.   Justice Britain, MD [place] [facility] Office # [phone]

## 2021-05-01 ENCOUNTER — Telehealth: Payer: Self-pay | Admitting: Gastroenterology

## 2021-05-01 DIAGNOSIS — K862 Cyst of pancreas: Secondary | ICD-10-CM

## 2021-05-01 DIAGNOSIS — Z8719 Personal history of other diseases of the digestive system: Secondary | ICD-10-CM

## 2021-05-01 NOTE — Telephone Encounter (Signed)
Patient called and stated that Dr. Rush Landmark wanted him to have a MRI. Patient is wanting to schedule. Please advise.

## 2021-05-01 NOTE — Telephone Encounter (Signed)
Called pt and discussed that he is due for follow up MRI.  See 05/2020 MRI.  Order entered and sent to the schedulers to set up.  The pt aware to call if he has not heard from them in 1 week.

## 2021-05-11 ENCOUNTER — Ambulatory Visit (HOSPITAL_COMMUNITY)
Admission: EM | Admit: 2021-05-11 | Discharge: 2021-05-11 | Disposition: A | Discharge status: Home / Self Care | Payer: 59 | Attending: Student | Admitting: Student

## 2021-05-11 ENCOUNTER — Other Ambulatory Visit: Payer: Self-pay

## 2021-05-11 DIAGNOSIS — H1032 Unspecified acute conjunctivitis, left eye: Secondary | ICD-10-CM

## 2021-05-11 MED ORDER — CIPROFLOXACIN HCL 0.3 % OP SOLN
1.0000 [drp] | OPHTHALMIC | 0 refills | Status: AC
Start: 2021-05-11 — End: 2021-05-18

## 2021-05-11 NOTE — ED Provider Notes (Signed)
[place]    CSN: [id number] Arrival date & time: 05/11/21  1024      History   Chief Complaint Chief Complaint  Patient presents with   Eye Pain    HPI Paul [name] is a 41 y.o. male presenting with bilateral eye irritation.  Medical history contact lens wear.  States that first his left eye and then his right eye have been irritated with scratchy feeling for about 3 days.  States symptoms developed after he was exposed to wife and daughter who had a viral syndrome, patient's only symptom is eye irritation however.  He does wear contact lenses, he threw out his current pair after symptoms developed and has only been using glasses.  Denies photophobia, foreign body sensation, eye crusting in the morning, eye pain, eye pain with movement, injury to eye, vision changes, double vision, excessive tearing, burning eyes   HPI  Past Medical History:  Diagnosis Date   Cancer of kidney ([place]) 2011   "left"   Diabetes mellitus without complication (HCC)    Elevated blood uric acid level    "I take RX for it; Allopurinol" (11/04/2017)   GERD (gastroesophageal reflux disease)    High cholesterol    Hypertension    Migraine    "only in my teens" (11/04/2017)   Pancreatitis    Pneumonia     Patient Active Problem List   Diagnosis Date Noted   Exocrine pancreatic insufficiency 11/28/2018   Bloating 11/28/2018   Pancreatic pseudocyst 10/09/2018   Atrophic pancreas 10/09/2018   History of pancreatitis 02/21/2018   Anemia 02/21/2018   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/21/2018   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   Abnormal CT of the abdomen 01/11/2018   Pancreatic necrosis    Hyperglycemia 12/09/2017   Pancreatic abscess    Necrotizing pancreatitis 12/08/2017   Abnormal LFTs    Acute pancreatitis 11/04/2017   ARF (acute renal failure) ([place]) 11/04/2017   Essential hypertension 11/04/2017    Past Surgical History:  Procedure Laterality Date   ADRENALECTOMY     left side   BIOPSY   12/11/2017   Procedure: BIOPSY;  Surgeon: Irving Copas., MD;  Location: [facility] ENDOSCOPY;  Service: Gastroenterology;;   ESOPHAGOGASTRODUODENOSCOPY (EGD) WITH PROPOFOL N/A 12/11/2017   Procedure: ESOPHAGOGASTRODUODENOSCOPY (EGD) WITH PROPOFOL;  Surgeon: Irving Copas., MD;  Location: [place];  Service: Gastroenterology;  Laterality: N/A;   ESOPHAGOGASTRODUODENOSCOPY (EGD) WITH PROPOFOL N/A 12/30/2017   Procedure: ESOPHAGOGASTRODUODENOSCOPY (EGD) WITH PROPOFOL Necrosectomy;  Surgeon: Rush Landmark Telford Nab., MD;  Location: [place];  Service: Gastroenterology;  Laterality: N/A;   IR CATHETER TUBE CHANGE  12/30/2017   IR RADIOLOGIST EVAL & MGMT  12/23/2017   IR RADIOLOGIST EVAL & MGMT  01/12/2018   IR RADIOLOGIST EVAL & MGMT  01/26/2018   NEPHRECTOMY Left 2011   "kidney cancer"   UPPER ESOPHAGEAL ENDOSCOPIC ULTRASOUND (EUS) N/A 12/11/2017   Procedure: UPPER ESOPHAGEAL ENDOSCOPIC ULTRASOUND (EUS);  Surgeon: Irving Copas., MD;  Location: [place];  Service: Gastroenterology;  Laterality: N/A;   UPPER GASTROINTESTINAL ENDOSCOPY  02/2017       Home Medications    Prior to Admission medications   Medication Sig Start Date End Date Taking? Authorizing Provider  ciprofloxacin (CILOXAN) 0.3 % ophthalmic solution Place 1 drop into both eyes every 4 (four) hours while awake for 7 days. 05/11/21 05/18/21 Yes Hazel Sams, PA-C  allopurinol (ZYLOPRIM) 100 MG tablet Take 100 mg by mouth 2 (two) times daily. 10/07/17   [provider]  amLODipine (NORVASC) 5 MG tablet Take 5 mg by mouth at bedtime.    [provider]  atenolol (TENORMIN) 50 MG tablet Take 50 mg by mouth daily.    [provider]  blood glucose meter kit and supplies Dispense based on patient and insurance preference. Use up to four times daily as directed. (FOR ICD-10 E10.9, E11.9). 11/18/17   Florencia Reasons, MD  CREON [phone] units CPEP capsule TAKE 2 CAPSULES WITH EACH MEAL.  TAKE 1 CAPSULE WITH EACH SNACK.( UP TO 2 SNACKS DAILY.) 07/27/20   Mansouraty, Telford Nab., MD  hyoscyamine (LEVSIN SL) 0.125 MG SL tablet Place 1 tablet (0.125 mg total) under the tongue every 6 (six) hours as needed (for abdominal pain). 05/04/20   Noralyn Pick, NP  insulin aspart (NOVOLOG) 100 UNIT/ML FlexPen Before each meal 3 times a day, 140-199 - 2 units, 200-250 - 4 units, 251-299 - 6 units,  300-349 - 8 units,  350 or above 10 units. Insulin PEN if approved, provide syringes and needles if needed. Patient taking differently: Inject 2-4 Units into the skin 3 (three) times daily before meals. 11/18/17   Florencia Reasons, MD  Insulin Pen Needle 31G X 5 MM MISC For insulin injection, please provide a month supply. 11/18/17   Florencia Reasons, MD  LEVEMIR FLEXTOUCH 100 UNIT/ML Pen Inject 8 Units into the skin at bedtime. Patient taking differently: Inject 24 Units into the skin at bedtime. 12/17/17   Domenic Polite, MD  Vitamin D, Ergocalciferol, (DRISDOL) 1.25 MG (50000 UNIT) CAPS capsule Take 50,000 Units by mouth once a week. 03/12/20   [provider]    Family History Family History  Problem Relation Age of Onset   Hypertension Mother    Hypertension Father    Pancreatitis Paternal Uncle        twice, was a drinker   Colon cancer Neg Hx    Esophageal cancer Neg Hx    Inflammatory bowel disease Neg Hx    Liver disease Neg Hx    Pancreatic cancer Neg Hx    Rectal cancer Neg Hx    Stomach cancer Neg Hx     Social History Social History   Tobacco Use   Smoking status: Former    Packs/day: 0.50    Years: 11.00    Pack years: 5.50    Types: Cigarettes    Quit date: 2011    Years since quitting: 12.1   Smokeless tobacco: Never  Vaping Use   Vaping Use: Never used  Substance Use Topics   Alcohol use: Yes    Alcohol/week: 4.0 standard drinks    Types: 4 Cans of beer per week    Comment: Occ   Drug use: Never     Allergies   Honey and Other   Review of  Systems Review of Systems  Eyes:  Positive for redness and itching. Negative for photophobia, pain, discharge and visual disturbance.  All other systems reviewed and are negative.   Physical Exam Triage Vital Signs ED Triage Vitals  Enc Vitals Group     BP 05/11/21 1054 128/84     Pulse Rate 05/11/21 1054 75     Resp 05/11/21 1054 16     Temp 05/11/21 1054 98.4 F (36.9 C)     Temp Source 05/11/21 1054 Oral     SpO2 05/11/21 1054 98 %     Weight --      Height --      Head Circumference --  Peak Flow --      Pain Score 05/11/21 1126 0     Pain Loc --      Pain Edu? --      Excl. in [place]? --    No data found.  Updated Vital Signs BP 128/84 (BP Location: Left Arm)    Pulse 75    Temp 98.4 F (36.9 C) (Oral)    Resp 16    SpO2 98%   Visual Acuity Right Eye Distance: 20/20 Left Eye Distance: 20/20 Bilateral Distance: 20/20  Right Eye Near: R Near: 20/20 Left Eye Near:  L Near: 20/20 Bilateral Near:  20/20  Physical Exam Vitals reviewed.  Constitutional:      General: He is not in acute distress.    Appearance: Normal appearance. He is not ill-appearing.  HENT:     Head: Normocephalic and atraumatic.     Right Ear: Hearing, tympanic membrane, ear canal and external ear normal. No tenderness. No middle ear effusion. There is no impacted cerumen. No mastoid tenderness. Tympanic membrane is not perforated, erythematous, retracted or bulging.     Left Ear: Hearing, tympanic membrane, ear canal and external ear normal. No tenderness.  No middle ear effusion. There is no impacted cerumen. No mastoid tenderness. Tympanic membrane is not perforated, erythematous, retracted or bulging.     Mouth/Throat:     Pharynx: No posterior oropharyngeal erythema.  Eyes:     General: Lids are normal. Lids are everted, no foreign bodies appreciated. Vision grossly intact. Gaze aligned appropriately. No visual field deficit.       Right eye: No foreign body, discharge or hordeolum.         Left eye: No foreign body, discharge or hordeolum.     Extraocular Movements: Extraocular movements intact.     Right eye: No nystagmus.     Left eye: No nystagmus.     Conjunctiva/sclera:     Right eye: Right conjunctiva is injected. No chemosis, exudate or hemorrhage.    Left eye: Left conjunctiva is injected. No chemosis, exudate or hemorrhage.    Pupils: Pupils are equal, round, and reactive to light. Pupils are equal.     Right eye: No corneal abrasion or fluorescein uptake. Seidel exam negative.     Left eye: No corneal abrasion or fluorescein uptake. Seidel exam negative.    Visual Fields: Right eye visual fields normal and left eye visual fields normal.     Comments: L conjunctival injection. Trace R conjunctival injection. No discharge. PERRLA. EOMI without pain. No proptosis. Anterior chamber, conjunctivae, sclera all without hemorrhage or visible injury. Visual acuity intact.   Cardiovascular:     Rate and Rhythm: Normal rate and regular rhythm.     Heart sounds: Normal heart sounds.  Pulmonary:     Effort: Pulmonary effort is normal.     Breath sounds: Normal breath sounds and air entry.  Lymphadenopathy:     Cervical: No cervical adenopathy.  Neurological:     General: No focal deficit present.     Mental Status: He is alert and oriented to person, place, and time.  Psychiatric:        Attention and Perception: Attention and perception normal.        Mood and Affect: Mood and affect normal.     UC Treatments / Results  Labs (all labs ordered are listed, but only abnormal results are displayed) Labs Reviewed - No data to display  EKG   Radiology No results found.  Procedures Procedures (including critical care time)  Medications Ordered in UC Medications - No data to display  Initial Impression / Assessment and Plan / UC Course  I have reviewed the triage vital signs and the nursing notes.  Pertinent labs & imaging results that were available during my  care of the patient were reviewed by me and considered in my medical decision making (see chart for details).     This patient is a very pleasant 41 y.o. year old male presenting with conjunctivitis. Afebrile, nontachy.  Visual acuity intact wearing glasses.  He does wear contact lenses, but has not worn them since onset of symptoms.  DDx is viral conjunctivitis versus bacterial conjunctivitis, left conjunctiva is moderately injected.  As he is a contact lens wearer, discussed DDx with patient, and we agreed to treat with ciprofloxacin drops as below.  He understands to throw out his current contacts and not wear any contact lenses until treatment is complete.  ED return precautions discussed. Patient verbalizes understanding and agreement.    Final Clinical Impressions(s) / UC Diagnoses   Final diagnoses:  Acute bacterial conjunctivitis of left eye     Discharge Instructions      -You have conjunctivitis (pinkeye).  -Start the antibiotic drops, Ciprofloxacin 1 drop into both eyes while awake for 7 days. -You can also try warm compresses or washing the face with gentle soap and water to remove crust in the morning. -Your symptoms should be a lot better in about 1 day, and you'll also no longer be contagious after 1 day on the antibiotic. -If your symptoms are getting worse instead of better, like pain, discharge, itching, vision changes-follow-up with your eye doctor as soon as possible. -Do not wear contact lenses until you have completed treatment (7 days!). Wear glasses only.     ED Prescriptions     Medication Sig Dispense Auth. Provider   ciprofloxacin (CILOXAN) 0.3 % ophthalmic solution Place 1 drop into both eyes every 4 (four) hours while awake for 7 days. 1.8 mL Hazel Sams, PA-C      PDMP not reviewed this encounter.   Hazel Sams, PA-C 05/11/21 1146

## 2021-05-11 NOTE — ED Triage Notes (Signed)
Pt c/o red eyes with pain x 3 days.

## 2021-05-11 NOTE — ED Triage Notes (Signed)
Pt does not report any visual changes at this time.

## 2021-05-11 NOTE — Discharge Instructions (Addendum)
-  You have conjunctivitis (pinkeye).  -Start the antibiotic drops, Ciprofloxacin 1 drop into both eyes while awake for 7 days. -You can also try warm compresses or washing the face with gentle soap and water to remove crust in the morning. -Your symptoms should be a lot better in about 1 day, and you'll also no longer be contagious after 1 day on the antibiotic. -If your symptoms are getting worse instead of better, like pain, discharge, itching, vision changes-follow-up with your eye doctor as soon as possible. -Do not wear contact lenses until you have completed treatment (7 days!). Wear glasses only.

## 2021-07-05 ENCOUNTER — Other Ambulatory Visit (HOSPITAL_COMMUNITY): Payer: 59

## 2021-07-09 ENCOUNTER — Ambulatory Visit: Payer: 59 | Admitting: Gastroenterology

## 2021-07-13 ENCOUNTER — Other Ambulatory Visit: Payer: Self-pay | Admitting: Gastroenterology

## 2021-07-13 ENCOUNTER — Ambulatory Visit (HOSPITAL_COMMUNITY)
Admission: RE | Admit: 2021-07-13 | Discharge: 2021-07-13 | Disposition: A | Discharge status: Home / Self Care | Payer: 59 | Source: Ambulatory Visit | Attending: Gastroenterology | Admitting: Gastroenterology

## 2021-07-13 DIAGNOSIS — K862 Cyst of pancreas: Secondary | ICD-10-CM

## 2021-07-13 DIAGNOSIS — Z8719 Personal history of other diseases of the digestive system: Secondary | ICD-10-CM | POA: Diagnosis present

## 2021-07-13 MED ORDER — GADOBUTROL 1 MMOL/ML IV SOLN
8.0000 mL | Freq: Once | INTRAVENOUS | Status: AC | PRN
  Administered 2021-07-13: 8 mL via INTRAVENOUS

## 2021-07-19 ENCOUNTER — Ambulatory Visit: Payer: 59 | Admitting: Gastroenterology

## 2021-07-19 ENCOUNTER — Encounter: Payer: Self-pay | Admitting: Gastroenterology

## 2021-07-19 VITALS — BP 110/70 | HR 54 | Ht 70.0 in | Wt 177.1 lb

## 2021-07-19 DIAGNOSIS — K8681 Exocrine pancreatic insufficiency: Secondary | ICD-10-CM | POA: Diagnosis not present

## 2021-07-19 DIAGNOSIS — K862 Cyst of pancreas: Secondary | ICD-10-CM

## 2021-07-19 DIAGNOSIS — Z8719 Personal history of other diseases of the digestive system: Secondary | ICD-10-CM

## 2021-07-19 DIAGNOSIS — R935 Abnormal findings on diagnostic imaging of other abdominal regions, including retroperitoneum: Secondary | ICD-10-CM | POA: Diagnosis not present

## 2021-07-19 NOTE — Patient Instructions (Addendum)
Please follow up with Dr Rush Landmark in 6 months. However, if you feel you are doing well at the 6 month mark, just let us know and we will just see you in 1 year. ? ?You will be due for an MRI/MCRP for follow up of pancreatic pseudocyst in 06/2022. We will contact you once it gets closer to that time to schedule this appointment. ? ?If you are age 40 or older, your body mass index should be between 23-30. Your Body mass index is 25.41 kg/m?Marland Kitchen If this is out of the aforementioned range listed, please consider follow up with your Primary Care Provider. ? ?If you are age 55 or younger, your body mass index should be between 19-25. Your Body mass index is 25.41 kg/m?Marland Kitchen If this is out of the aformentioned range listed, please consider follow up with your Primary Care Provider.  ? ?________________________________________________________ ? ?The [place] [facility] providers would like to encourage you to use [facility] to communicate with providers for non-urgent requests or questions.  Due to long hold times on the telephone, sending your provider a message by [facility] may be a faster and more efficient way to get a response.  Please allow 48 business hours for a response.  Please remember that this is for non-urgent requests.  ?_______________________________________________________ ? ?Due to recent changes in healthcare laws, you may see the results of your imaging and laboratory studies on MyChart before your provider has had a chance to review them.  We understand that in some cases there may be results that are confusing or concerning to you. Not all laboratory results come back in the same time frame and the provider may be waiting for multiple results in order to interpret others.  Please give [place] 48 hours in order for your provider to thoroughly review all the results before contacting the office for clarification of your results.  ? ?

## 2021-07-19 NOTE — Progress Notes (Signed)
? ?GASTROENTEROLOGY OUTPATIENT CLINIC VISIT  ? ?Primary Care Provider ?Karleen Hampshire., MD ?[address] ?[place] [address] ?[phone] ? ?Patient Profile: ?Paul [name] is a 41 y.o. 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  The patient presents to the [facility] for an evaluation and management of problem(s) noted below: ? ?Problem List ?1. Pancreatic cyst   ?2. History of pancreatitis   ?3. Exocrine pancreatic insufficiency   ?4. Abnormal CT of the pancreas   ? ? ? ?History of Present Illness: ?Please see prior notes for full details of HPI. ? ?Interval History ?The patient returns for yearly follow-up.  He has been doing very well.  He had his yearly MRI performed with results as below.  He has not had any significant abdominal pain or discomfort.  He has not had any bloating issues.  His bowel movements are regular.  He has somewhat already self titrated his Creon.  He uses it a few times per week but there are days where he may not use it.  He has not noted any significant changes in his bowel habits or pain or discomfort.  His weight is stable.  His blood sugars have been more well controlled recently.  Overall he feels well and his family is doing well.   ? ?GI Review of Systems ?Positive as above ?Negative for odynophagia, dysphagia, nausea, vomiting, pain, alteration of bowel habits, melena, hematochezia ? ?Review of Systems ?General: Denies fevers/chills/weight loss unintentionally ?Cardiovascular: Denies chest pain ?Pulmonary: Denies shortness of breath ?Gastroenterological: See HPI ?Genitourinary: Denies darkened urine ?Dermatological: Denies jaundice ?Psychological: Mood is  stable ? ? ?Medications ?Current Outpatient Medications  ?Medication Sig Dispense Refill  ? allopurinol (ZYLOPRIM) 100 MG tablet Take 100 mg by mouth 2 (two) times daily.  1  ? amLODipine (NORVASC) 5 MG tablet Take 5 mg by mouth at bedtime.    ? atenolol (TENORMIN) 50 MG tablet Take 50 mg by mouth daily.    ? blood glucose meter kit and supplies Dispense based on patient and insurance preference. Use up to four times daily as directed. (FOR ICD-10 E10.9, E11.9). 1 each 0  ? CREON 36000-114000 units CPEP capsule TAKE 2 CAPSULES WITH EACH MEAL. TAKE 1 CAPSULE WITH EACH SNACK.( UP TO 2 SNACKS DAILY.) (Patient taking differently: as needed.) 720 capsule 3  ? hyoscyamine (LEVSIN SL) 0.125 MG SL tablet Place 1 tablet (0.125 mg total) under the tongue every 6 (six) hours as needed (for abdominal pain). (Patient taking differently: Place 0.125 mg under the tongue as needed (for abdominal pain).) 30 tablet 0  ? insulin aspart (NOVOLOG) 100 UNIT/ML FlexPen Before each meal 3 times a day, 140-199 - 2 units, 200-250 - 4 units, 251-299 - 6 units,  300-349 - 8 units,  350 or above 10 units. ?Insulin PEN if approved, provide syringes and needles if needed. (Patient taking differently: Inject 2-4 Units into the skin 3 (three) times daily before meals.) 15 mL 0  ? Insulin Pen Needle 31G X 5 MM MISC For insulin injection, please provide a month supply. 100 each 0  ? LEVEMIR FLEXTOUCH 100 UNIT/ML Pen Inject 8 Units into the skin at bedtime. (Patient taking differently: Inject 24 Units into the skin at bedtime.)    ?  Vitamin D, Ergocalciferol, (DRISDOL) 1.25 MG (50000 UNIT) CAPS capsule Take 50,000 Units by mouth once a week.    ? ?No current facility-administered medications for this visit.  ? ? ?Allergies ?Allergies  ?Allergen Reactions  ? Honey Diarrhea and Nausea And Vomiting  ? Other Rash  ?  EGGPLANT  ? ? ?Histories ?Past Medical History:  ?Diagnosis Date  ? Cancer of kidney [facility]) 2011  ? "left"  ? Diabetes mellitus without  complication ([place])   ? Elevated blood uric acid level   ? "I take RX for it; Allopurinol" (11/04/2017)  ? GERD (gastroesophageal reflux disease)   ? High cholesterol   ? Hypertension   ? Migraine   ? "only in my teens" (11/04/2017)  ? Pancreatitis   ? Pneumonia   ? ?Past Surgical History:  ?Procedure Laterality Date  ? ADRENALECTOMY    ? left side  ? BIOPSY  12/11/2017  ? Procedure: BIOPSY;  Surgeon: Irving Copas., MD;  Location: [place];  Service: Gastroenterology;;  ? ESOPHAGOGASTRODUODENOSCOPY (EGD) WITH PROPOFOL N/A 12/11/2017  ? Procedure: ESOPHAGOGASTRODUODENOSCOPY (EGD) WITH PROPOFOL;  Surgeon: Rush Landmark Telford Nab., MD;  Location: [place];  Service: Gastroenterology;  Laterality: N/A;  ? ESOPHAGOGASTRODUODENOSCOPY (EGD) WITH PROPOFOL N/A 12/30/2017  ? Procedure: ESOPHAGOGASTRODUODENOSCOPY (EGD) WITH PROPOFOL Necrosectomy;  Surgeon: Rush Landmark Telford Nab., MD;  Location: [place];  Service: Gastroenterology;  Laterality: N/A;  ? IR CATHETER TUBE CHANGE  12/30/2017  ? IR RADIOLOGIST EVAL & MGMT  12/23/2017  ? IR RADIOLOGIST EVAL & MGMT  01/12/2018  ? IR RADIOLOGIST EVAL & MGMT  01/26/2018  ? NEPHRECTOMY Left 2011  ? "kidney cancer"  ? UPPER ESOPHAGEAL ENDOSCOPIC ULTRASOUND (EUS) N/A 12/11/2017  ? Procedure: UPPER ESOPHAGEAL ENDOSCOPIC ULTRASOUND (EUS);  Surgeon: Irving Copas., MD;  Location: [place];  Service: Gastroenterology;  Laterality: N/A;  ? UPPER GASTROINTESTINAL ENDOSCOPY  02/2017  ? ?Social History  ? ?Socioeconomic History  ? Marital status: Married  ?  Spouse name: Not on file  ? Number of children: Not on file  ? Years of education: Not on file  ? Highest education level: Not on file  ?Occupational History  ? Not on file  ?Tobacco Use  ? Smoking status: Former  ?  Packs/day: 0.50  ?  Years: 11.00  ?  Pack years: 5.50  ?  Types: Cigarettes  ?  Quit date: 2011  ?  Years since quitting: 12.3  ? Smokeless tobacco: Never  ?Vaping Use  ? Vaping Use: Never used  ?Substance  and Sexual Activity  ? Alcohol use: Yes  ?  Alcohol/week: 4.0 standard drinks  ?  Types: 4 Cans of beer per week  ?  Comment: Occ  ? Drug use: Never  ? Sexual activity: Yes  ?Other Topics Concern  ? Not on file  ?Social History Narrative  ? Married, 1 child, wife expecting  ? 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 Former smoker  ? 4 beers/week  ? No drugs  ? ?Social Determinants of Health  ? ?Financial Resource Strain: Not on file  ?Food Insecurity: Not on file  ?Transportation Needs: Not on file  ?Physical Activity: Not on file  ?Stress: Not on file  ?Social Connections: Not on file  ?Intimate Partner Violence: Not on file  ? ?Family History  ?Problem Relation Age of Onset  ? Hypertension Mother   ? Hypertension Father   ? Pancreatitis Paternal Uncle   ?     twice, was a drinker  ? Colon  cancer Neg Hx   ? Esophageal cancer Neg Hx   ? Inflammatory bowel disease Neg Hx   ? Liver disease Neg Hx   ? Pancreatic cancer Neg Hx   ? Rectal cancer Neg Hx   ? Stomach cancer Neg Hx   ? ?I have reviewed his medical, social, and family history in detail and updated the electronic medical record as necessary.  ? ? ?PHYSICAL EXAMINATION  ?BP 110/70 (BP Location: Left Arm, Patient Position: Sitting, Cuff Size: Normal)   Pulse (!) 54   Ht _0  (1.778 m)   Wt 177 lb 2 oz (80.3 kg)   BMI 25.41 kg/m?  ?GEN: NAD, appears stated age, doesn't appear chronically ill ?PSYCH: Cooperative, without pressured speech ?EYE: Conjunctivae pink, sclerae anicteric ?ENT: MMM ?CV: Nontachycardic ?RESP: No audible wheezing  ?GI: NABS, soft, NT/ND, without rebound ?MSK/EXT: No lower extremity edema ?SKIN: No jaundice ?NEURO:  Alert & Oriented x 3, no focal deficits, ? ? ?REVIEW OF DATA  ?I reviewed the following data at the time of this encounter: ? ?GI Procedures and Studies  ?No new studies ? ?Laboratory Studies  ?Reviewed in epic ? ?Imaging Studies  ?March 2022 MRI/MRCP ?IMPRESSION: ?1. Marked atrophy of the pancreatic parenchyma in the body and  tail region. 2.8 x 2.5 cm well-defined cyst in the expected location of the pancreatic tail just medial to the spleen. This could be a chronic pseudocyst. Left adrenal gland is not discretely visible on MRI today and left adrena

## 2021-07-22 ENCOUNTER — Encounter: Payer: Self-pay | Admitting: Gastroenterology

## 2021-07-22 DIAGNOSIS — K862 Cyst of pancreas: Secondary | ICD-10-CM | POA: Insufficient documentation

## 2022-04-04 ENCOUNTER — Encounter: Payer: Self-pay | Admitting: Gastroenterology

## 2022-07-01 ENCOUNTER — Telehealth (HOSPITAL_COMMUNITY): Payer: Self-pay | Admitting: Emergency Medicine

## 2022-07-01 ENCOUNTER — Ambulatory Visit (HOSPITAL_COMMUNITY)
Admission: EM | Admit: 2022-07-01 | Discharge: 2022-07-01 | Disposition: A | Discharge status: Home / Self Care | Payer: BC Managed Care – PPO | Attending: Family Medicine | Admitting: Family Medicine

## 2022-07-01 ENCOUNTER — Encounter (HOSPITAL_COMMUNITY): Payer: Self-pay | Admitting: *Deleted

## 2022-07-01 ENCOUNTER — Other Ambulatory Visit: Payer: Self-pay

## 2022-07-01 DIAGNOSIS — J069 Acute upper respiratory infection, unspecified: Secondary | ICD-10-CM

## 2022-07-01 DIAGNOSIS — H6692 Otitis media, unspecified, left ear: Secondary | ICD-10-CM

## 2022-07-01 DIAGNOSIS — U071 COVID-19: Secondary | ICD-10-CM | POA: Diagnosis not present

## 2022-07-01 MED ORDER — CEFDINIR 300 MG PO CAPS: 600.0000 mg | ORAL_CAPSULE | Freq: Every day | ORAL | 0 refills | Status: AC

## 2022-07-01 MED ORDER — CEFDINIR 300 MG PO CAPS: 600.0000 mg | ORAL_CAPSULE | Freq: Every day | ORAL | 0 refills | Status: DC

## 2022-07-01 NOTE — ED Provider Notes (Signed)
[place]    CSN: [id number] Arrival date & time: 07/01/22  1235      History   Chief Complaint Chief Complaint  Patient presents with   URI   Otalgia    HPI Paul [name] is a 42 y.o. male.    URI Presenting symptoms: ear pain   Otalgia  Here for cough and congestion that is mild.  Symptoms began on March 31 in the evening.  Then yesterday his left ear started hurting and felt muffled.  He does have a history of type 1 diabetes sugars fairly well-controlled lately.  He had not noted any fever at home, but his temperature here is 100.3   Past Medical History:  Diagnosis Date   Cancer of kidney 2011   "left"   Diabetes mellitus without complication    Elevated blood uric acid level    "I take RX for it; Allopurinol" (11/04/2017)   GERD (gastroesophageal reflux disease)    High cholesterol    Hypertension    Migraine    "only in my teens" (11/04/2017)   Pancreatitis    Pneumonia     Patient Active Problem List   Diagnosis Date Noted   Pancreatic cyst 07/22/2021   Exocrine pancreatic insufficiency 11/28/2018   Bloating 11/28/2018   Pancreatic pseudocyst 10/09/2018   Atrophic pancreas 10/09/2018   History of pancreatitis 02/21/2018   Anemia 02/21/2018   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/21/2018   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   Abnormal CT of the abdomen 01/11/2018   Pancreatic necrosis    Hyperglycemia 12/09/2017   Pancreatic abscess    Necrotizing pancreatitis 12/08/2017   Abnormal LFTs    Acute pancreatitis 11/04/2017   ARF (acute renal failure) 11/04/2017   Essential hypertension 11/04/2017    Past Surgical History:  Procedure Laterality Date   ADRENALECTOMY     left side   BIOPSY  12/11/2017   Procedure: BIOPSY;  Surgeon: Irving Copas., MD;  Location: [facility] END[facility]COPY;  Service: Gastroenterology;;   ESOPHAGOGASTRODUODENOSCOPY (EGD) WITH PROPOFOL N/A 12/11/2017   Procedure: ESOPHAGOGASTRODUODENOSCOPY (EGD) WITH PROPOFOL;  Surgeon: Irving Copas., MD;  Location: [place];  Service: Gastroenterology;  Laterality: N/A;   ESOPHAGOGASTRODUODENOSCOPY (EGD) WITH PROPOFOL N/A 12/30/2017   Procedure: ESOPHAGOGASTRODUODENOSCOPY (EGD) WITH PROPOFOL Necrosectomy;  Surgeon: Rush Landmark Telford Nab., MD;  Location: [place];  Service: Gastroenterology;  Laterality: N/A;   IR CATHETER TUBE CHANGE  12/30/2017   IR RADIOLOGIST EVAL & MGMT  12/23/2017   IR RADIOLOGIST EVAL & MGMT  01/12/2018   IR RADIOLOGIST EVAL & MGMT  01/26/2018   NEPHRECTOMY Left 2011   "kidney cancer"   UPPER ESOPHAGEAL ENDOSCOPIC ULTRASOUND (EUS) N/A 12/11/2017   Procedure: UPPER ESOPHAGEAL ENDOSCOPIC ULTRASOUND (EUS);  Surgeon: Irving Copas., MD;  Location: [place];  Service: Gastroenterology;  Laterality: N/A;   UPPER GASTROINTESTINAL ENDOSCOPY  02/2017       Home Medications    Prior to Admission medications   Medication Sig Start Date End Date Taking? Authorizing Provider  cefdinir (OMNICEF) 300 MG capsule Take 2 capsules (600 mg total) by mouth daily for 7 days. 07/01/22 07/08/22 Yes Apple Dearmas, Gwenlyn Perking, MD  allopurinol (ZYLOPRIM) 100 MG tablet Take 100 mg by mouth 2 (two) times daily. 10/07/17   [provider]  amLODipine (NORVASC) 5 MG tablet Take 5 mg by mouth at bedtime.    [provider]  atenolol (TENORMIN) 50 MG tablet Take 50 mg by mouth daily.    [provider]  blood  glucose meter kit and supplies Dispense based on patient and insurance preference. Use up to four times daily as directed. (FOR ICD-10 E10.9, E11.9). 11/18/17   Florencia Reasons, MD  CREON [phone] units CPEP capsule TAKE 2 CAPSULES WITH EACH MEAL. TAKE 1 CAPSULE WITH EACH SNACK.( UP TO 2 SNACKS DAILY.) Patient taking differently: as needed. 07/27/20   Mansouraty, Telford Nab., MD  hyoscyamine (LEVSIN SL) 0.125 MG SL tablet Place 1 tablet (0.125 mg total) under the tongue every 6 (six) hours as needed (for abdominal pain). Patient taking differently:  Place 0.125 mg under the tongue as needed (for abdominal pain). 05/04/20   Noralyn Pick, NP  insulin aspart (NOVOLOG) 100 UNIT/ML FlexPen Before each meal 3 times a day, 140-199 - 2 units, 200-250 - 4 units, 251-299 - 6 units,  300-349 - 8 units,  350 or above 10 units. Insulin PEN if approved, provide syringes and needles if needed. Patient taking differently: Inject 2-4 Units into the skin 3 (three) times daily before meals. 11/18/17   Florencia Reasons, MD  Insulin Pen Needle 31G X 5 MM MISC For insulin injection, please provide a month supply. 11/18/17   Florencia Reasons, MD  LEVEMIR FLEXTOUCH 100 UNIT/ML Pen Inject 8 Units into the skin at bedtime. Patient taking differently: Inject 24 Units into the skin at bedtime. 12/17/17   Domenic Polite, MD  Vitamin D, Ergocalciferol, (DRISDOL) 1.25 MG (50000 UNIT) CAPS capsule Take 50,000 Units by mouth once a week. 03/12/20   [provider]    Family History Family History  Problem Relation Age of Onset   Hypertension Mother    Hypertension Father    Pancreatitis Paternal Uncle        twice, was a drinker   Colon cancer Neg Hx    Esophageal cancer Neg Hx    Inflammatory bowel disease Neg Hx    Liver disease Neg Hx    Pancreatic cancer Neg Hx    Rectal cancer Neg Hx    Stomach cancer Neg Hx     Social History Social History   Tobacco Use   Smoking status: Former    Packs/day: 0.50    Years: 11.00    Additional pack years: 0.00    Total pack years: 5.50    Types: Cigarettes    Quit date: 2011    Years since quitting: 13.2   Smokeless tobacco: Never  Vaping Use   Vaping Use: Never used  Substance Use Topics   Alcohol use: Yes    Alcohol/week: 4.0 standard drinks of alcohol    Types: 4 Cans of beer per week    Comment: Occ   Drug use: Never     Allergies   Honey and Other   Review of Systems Review of Systems  HENT:  Positive for ear pain.      Physical Exam Triage Vital Signs ED Triage Vitals  Enc Vitals Group      BP 07/01/22 1429 (!) 140/93     Pulse Rate 07/01/22 1429 76     Resp 07/01/22 1429 18     Temp 07/01/22 1429 100.3 F (37.9 C)     Temp src --      SpO2 07/01/22 1429 97 %     Weight --      Height --      Head Circumference --      Peak Flow --      Pain Score 07/01/22 1427 8     Pain  Loc --      Pain Edu? --      Excl. in [place]? --    No data found.  Updated Vital Signs BP (!) 140/93   Pulse 76   Temp 100.3 F (37.9 C)   Resp 18   SpO2 97%   Visual Acuity Right Eye Distance:   Left Eye Distance:   Bilateral Distance:    Right Eye Near:   Left Eye Near:    Bilateral Near:     Physical Exam Vitals reviewed.  Constitutional:      General: He is not in acute distress.    Appearance: He is not ill-appearing, toxic-appearing or diaphoretic.  HENT:     Right Ear: Tympanic membrane and ear canal normal.     Left Ear: Ear canal normal.     Ears:     Comments: Left tympanic membrane is red and dull with altered landmarks.  There is no discharge in the canal     Nose: Congestion present.     Mouth/Throat:     Pharynx: No oropharyngeal exudate or posterior oropharyngeal erythema.  Eyes:     Extraocular Movements: Extraocular movements intact.     Conjunctiva/sclera: Conjunctivae normal.     Pupils: Pupils are equal, round, and reactive to light.  Cardiovascular:     Rate and Rhythm: Normal rate and regular rhythm.     Heart sounds: No murmur heard. Pulmonary:     Effort: Pulmonary effort is normal. No respiratory distress.     Breath sounds: No stridor. No wheezing, rhonchi or rales.  Musculoskeletal:     Cervical back: Neck supple.  Lymphadenopathy:     Cervical: No cervical adenopathy.  Skin:    Capillary Refill: Capillary refill takes less than 2 seconds.     Coloration: Skin is not jaundiced or pale.  Neurological:     General: No focal deficit present.     Mental Status: He is alert and oriented to person, place, and time.  Psychiatric:         Behavior: Behavior normal.      UC Treatments / Results  Labs (all labs ordered are listed, but only abnormal results are displayed) Labs Reviewed  SARS CORONAVIRUS 2 (TAT 6-24 HRS)    EKG   Radiology No results found.  Procedures Procedures (including critical care time)  Medications Ordered in UC Medications - No data to display  Initial Impression / Assessment and Plan / UC Course  I have reviewed the triage vital signs and the nursing notes.  Pertinent labs & imaging results that were available during my care of the patient were reviewed by me and considered in my medical decision making (see chart for details).        Carole Civil is sent in to treat the ear infection.  COVID swab was done and if positive he is a candidate for Paxlovid with his type 1 diabetes.  His last EGFR in care everywhere in 2023 was 90 Final Clinical Impressions(s) / UC Diagnoses   Final diagnoses:  Viral upper respiratory tract infection  Left otitis media, unspecified otitis media type     Discharge Instructions      Take cefdinir 300 mg--2 capsules together daily for 7 days; this is the antibiotic for the ear infection   You have been swabbed for COVID, and the test will result in the next 24 hours. Our staff will call you if positive. If the COVID test is positive, you should quarantine  until you are fever free for 24 hours and you are starting to feel better, and then take added precautions for the next 5 days, such as physical distancing/wearing a mask and good hand hygiene/washing.       ED Prescriptions     Medication Sig Dispense Auth. Provider   cefdinir (OMNICEF) 300 MG capsule Take 2 capsules (600 mg total) by mouth daily for 7 days. 14 capsule Windy Carina, Gwenlyn Perking, MD      PDMP not reviewed this encounter.   Barrett Henle, MD 07/01/22 [phone]

## 2022-07-01 NOTE — Discharge Instructions (Signed)
Take cefdinir 300 mg--2 capsules together daily for 7 days; this is the antibiotic for the ear infection   You have been swabbed for COVID, and the test will result in the next 24 hours. Our staff will call you if positive. If the COVID test is positive, you should quarantine until you are fever free for 24 hours and you are starting to feel better, and then take added precautions for the next 5 days, such as physical distancing/wearing a mask and good hand hygiene/washing.

## 2022-07-01 NOTE — ED Triage Notes (Signed)
Pt reports URI sx's and lt ear pain after returning from [place] this weekend. Pt has tried tylenol for pain with some relief.

## 2022-07-02 ENCOUNTER — Telehealth (HOSPITAL_COMMUNITY): Payer: Self-pay

## 2022-07-02 LAB — SARS CORONAVIRUS 2 (TAT 6-24 HRS): SARS Coronavirus 2: POSITIVE — AB

## 2022-07-02 MED ORDER — PAXLOVID (300/100) 20 X 150 MG & 10 X 100MG PO TBPK: 3.0000 | ORAL_TABLET | Freq: Two times a day (BID) | ORAL | 0 refills | Status: AC

## 2022-07-14 ENCOUNTER — Telehealth: Payer: Self-pay

## 2022-07-14 DIAGNOSIS — K862 Cyst of pancreas: Secondary | ICD-10-CM

## 2022-07-14 DIAGNOSIS — Z8719 Personal history of other diseases of the digestive system: Secondary | ICD-10-CM

## 2022-07-14 DIAGNOSIS — K8681 Exocrine pancreatic insufficiency: Secondary | ICD-10-CM

## 2022-07-14 NOTE — Telephone Encounter (Signed)
-----   Message from Richardson Chiquito, CMA sent at 07/19/2021  3:51 PM EDT ----- Patient needs MRI/MRCP around 07/14/22 for follow up of pancreatic pseudocyst. See 07/19/21 office note for more details

## 2022-07-29 ENCOUNTER — Telehealth: Payer: Self-pay | Admitting: Gastroenterology

## 2022-07-29 NOTE — Telephone Encounter (Signed)
Patient called stating the Dr. Meridee Score instructed him to have another MRI in a year after his last office visit in 2023. Patient is requesting a call back to discuss scheduling for MRI, or if he needs to come in the office first. Please advise, thank you.

## 2022-07-29 NOTE — Telephone Encounter (Signed)
Returned call to patient- had leave message. Patient will be contacted by scheduling to schedule this.   You will be contacted by [facility] Scheduling in the next 2 days to arrange a MRI. The number on your caller ID will be [phone], please answer when they call.  If you have not heard from them in 2 days please call [phone] to schedule.  Message was sent to patient via my chart as well.

## 2022-07-29 NOTE — Telephone Encounter (Signed)
Ro looks like you are working on this. Let me know if I need to do anything.

## 2022-08-18 ENCOUNTER — Ambulatory Visit (HOSPITAL_COMMUNITY)
Admission: RE | Admit: 2022-08-18 | Discharge: 2022-08-18 | Disposition: A | Discharge status: Home / Self Care | Payer: BC Managed Care – PPO | Source: Ambulatory Visit | Attending: Gastroenterology | Admitting: Gastroenterology

## 2022-08-18 ENCOUNTER — Other Ambulatory Visit: Payer: Self-pay | Admitting: Gastroenterology

## 2022-08-18 DIAGNOSIS — Z8719 Personal history of other diseases of the digestive system: Secondary | ICD-10-CM

## 2022-08-18 DIAGNOSIS — K8681 Exocrine pancreatic insufficiency: Secondary | ICD-10-CM | POA: Diagnosis present

## 2022-08-18 DIAGNOSIS — K862 Cyst of pancreas: Secondary | ICD-10-CM

## 2022-08-18 MED ORDER — GADOBUTROL 1 MMOL/ML IV SOLN
8.0000 mL | Freq: Once | INTRAVENOUS | Status: AC | PRN
  Administered 2022-08-18: 8 mL via INTRAVENOUS

## 2022-09-09 ENCOUNTER — Ambulatory Visit: Payer: BC Managed Care – PPO | Admitting: Gastroenterology

## 2022-09-09 ENCOUNTER — Encounter: Payer: Self-pay | Admitting: Gastroenterology

## 2022-09-09 VITALS — BP 106/80 | HR 53 | Ht 70.0 in | Wt 183.0 lb

## 2022-09-09 DIAGNOSIS — K862 Cyst of pancreas: Secondary | ICD-10-CM | POA: Diagnosis not present

## 2022-09-09 DIAGNOSIS — Z8719 Personal history of other diseases of the digestive system: Secondary | ICD-10-CM | POA: Diagnosis not present

## 2022-09-09 DIAGNOSIS — Z1211 Encounter for screening for malignant neoplasm of colon: Secondary | ICD-10-CM

## 2022-09-09 NOTE — Patient Instructions (Addendum)
Follow-up in 2 years, sooner if needed.   Recall Colonoscopy at age 42. Office will contact you to schedule when it is time to schedule. (2028)  _______________________________________________________  If your blood pressure at your visit was 140/90 or greater, please contact your primary care physician to follow up on this.  _______________________________________________________  If you are age 71 or older, your body mass index should be between 23-30. Your Body mass index is 26.26 kg/m. If this is out of the aforementioned range listed, please consider follow up with your Primary Care Provider.  If you are age 80 or younger, your body mass index should be between 19-25. Your Body mass index is 26.26 kg/m. If this is out of the aformentioned range listed, please consider follow up with your Primary Care Provider.   ________________________________________________________  The [place] [facility] providers would like to encourage you to use [facility] to communicate with providers for non-urgent requests or questions.  Due to long hold times on the telephone, sending your provider a message by [facility] may be a faster and more efficient way to get a response.  Please allow 48 business hours for a response.  Please remember that this is for non-urgent requests.  _______________________________________________________  Thank you for choosing me and [facility].  Dr. Meridee Score

## 2022-09-13 DIAGNOSIS — Z1211 Encounter for screening for malignant neoplasm of colon: Secondary | ICD-10-CM | POA: Insufficient documentation

## 2022-09-13 NOTE — Progress Notes (Signed)
GASTROENTEROLOGY OUTPATIENT CLINIC VISIT   Primary Care Provider Laqueta Due., MD [address] [place] [address] [phone]  Patient Profile: Paul [name] is a 42 y.o. 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  The patient presents to the [facility] for an evaluation and management of problem(s) noted below:  Problem List 1. History of pancreatitis   2. Pancreatic cyst   3. Colon cancer screening     History of Present Illness: Please see prior notes for full details of HPI.  Interval History The patient returns for follow-up.  Overall has been doing well.  He is transition to a new job.  Weight has increased somewhat which she attributes to his job and not being as active as he used to be in regards to walking.  Patient is not having significant episodes of pain or bloating.  Bowel movements remain regular.  He has been off of Creon completely for over a year now.  Overall he is doing well and his family is doing well.   GI Review of Systems Positive as above Negative for odynophagia, dysphagia, nausea, vomiting, pain, alteration of bowel habits, melena, hematochezia  Review of Systems General: Denies fevers/chills/weight loss unintentionally Cardiovascular: Denies chest pain Pulmonary: Denies shortness of breath Gastroenterological: See HPI Genitourinary: Denies darkened urine Dermatological: Denies jaundice Psychological: Mood is stable   Medications Current Outpatient Medications  Medication Sig Dispense Refill   allopurinol (ZYLOPRIM) 100 MG tablet Take 100 mg by mouth 2 (two) times daily.  1   amLODipine (NORVASC) 5 MG tablet Take 5 mg by mouth at  bedtime.     atenolol (TENORMIN) 50 MG tablet Take 50 mg by mouth daily.     blood glucose meter kit and supplies Dispense based on patient and insurance preference. Use up to four times daily as directed. (FOR ICD-10 E10.9, E11.9). 1 each 0   insulin aspart (NOVOLOG) 100 UNIT/ML FlexPen Before each meal 3 times a day, 140-199 - 2 units, 200-250 - 4 units, 251-299 - 6 units,  300-349 - 8 units,  350 or above 10 units. Insulin PEN if approved, provide syringes and needles if needed. (Patient taking differently: Inject 2-4 Units into the skin 3 (three) times daily before meals.) 15 mL 0   Insulin Pen Needle 31G X 5 MM MISC For insulin injection, please provide a month supply. 100 each 0   LEVEMIR FLEXTOUCH 100 UNIT/ML Pen Inject 8 Units into the skin at bedtime. (Patient taking differently: Inject 24 Units into the skin at bedtime.)     Vitamin D, Ergocalciferol, (DRISDOL) 1.25 MG (50000 UNIT) CAPS capsule Take 50,000 Units by mouth once a week.     CREON 36000-114000 units CPEP capsule TAKE 2 CAPSULES WITH EACH MEAL. TAKE 1 CAPSULE WITH EACH SNACK.( UP TO 2 SNACKS DAILY.) (Patient not taking: Reported on 09/09/2022) 720 capsule 3   hyoscyamine (LEVSIN SL) 0.125 MG SL tablet Place 1 tablet (0.125 mg total) under the tongue every 6 (six) hours as needed (for abdominal pain). (Patient not taking: Reported on 09/09/2022) 30 tablet 0   No current facility-administered medications for this visit.    Allergies Allergies  Allergen Reactions   Honey Diarrhea and Nausea And Vomiting   Other Rash  EGGPLANT    Histories Past Medical History:  Diagnosis Date   Cancer of kidney (HCC) 2011   "left"   Diabetes mellitus without complication (HCC)    Elevated blood uric acid level    "I take RX for it; Allopurinol" (11/04/2017)   GERD (gastroesophageal reflux disease)    High cholesterol    Hypertension    Migraine    "only in my teens" (11/04/2017)   Pancreatitis    Pneumonia    Past Surgical  History:  Procedure Laterality Date   ADRENALECTOMY     left side   BIOPSY  12/11/2017   Procedure: BIOPSY;  Surgeon: Lemar Lofty., MD;  Location: [facility] ENDOSCOPY;  Service: Gastroenterology;;   ESOPHAGOGASTRODUODENOSCOPY (EGD) WITH PROPOFOL N/A 12/11/2017   Procedure: ESOPHAGOGASTRODUODENOSCOPY (EGD) WITH PROPOFOL;  Surgeon: Lemar Lofty., MD;  Location: [facility] ENDOSCOPY;  Service: Gastroenterology;  Laterality: N/A;   ESOPHAGOGASTRODUODENOSCOPY (EGD) WITH PROPOFOL N/A 12/30/2017   Procedure: ESOPHAGOGASTRODUODENOSCOPY (EGD) WITH PROPOFOL Necrosectomy;  Surgeon: Meridee Score Netty Starring., MD;  Location: [facility] ENDOSCOPY;  Service: Gastroenterology;  Laterality: N/A;   IR CATHETER TUBE CHANGE  12/30/2017   IR RADIOLOGIST EVAL & MGMT  12/23/2017   IR RADIOLOGIST EVAL & MGMT  01/12/2018   IR RADIOLOGIST EVAL & MGMT  01/26/2018   NEPHRECTOMY Left 2011   "kidney cancer"   UPPER ESOPHAGEAL ENDOSCOPIC ULTRASOUND (EUS) N/A 12/11/2017   Procedure: UPPER ESOPHAGEAL ENDOSCOPIC ULTRASOUND (EUS);  Surgeon: Lemar Lofty., MD;  Location: [facility] ENDOSCOPY;  Service: Gastroenterology;  Laterality: N/A;   UPPER GASTROINTESTINAL ENDOSCOPY  02/2017   Social History   Socioeconomic History   Marital status: Married    Spouse name: Not on file   Number of children: Not on file   Years of education: Not on file   Highest education level: Not on file  Occupational History   Not on file  Tobacco Use   Smoking status: Former    Packs/day: 0.50    Years: 11.00    Additional pack years: 0.00    Total pack years: 5.50    Types: Cigarettes    Quit date: 2011    Years since quitting: 13.4   Smokeless tobacco: Never  Vaping Use   Vaping Use: Never used  Substance and Sexual Activity   Alcohol use: Yes    Alcohol/week: 4.0 standard drinks of alcohol    Types: 4 Cans of beer per week    Comment: Occ   Drug use: Never   Sexual activity: Yes  Other Topics Concern   Not on file  Social History  Narrative   Married, 1 child, wife expecting   IT consultant [employer]   Former smoker   4 beers/week   No drugs   Social Determinants of Corporate investment banker Strain: Not on file  Food Insecurity: Not on file  Transportation Needs: Not on file  Physical Activity: Not on file  Stress: Not on file  Social Connections: Not on file  Intimate Partner Violence: Not on file   Family History  Problem Relation Age of Onset   Hypertension Mother    Hypertension Father    Pancreatitis Paternal Uncle        twice, was a drinker   Colon cancer Neg Hx    Esophageal cancer Neg Hx    Inflammatory bowel disease Neg Hx    Liver disease Neg Hx    Pancreatic cancer Neg Hx    Rectal cancer Neg Hx  Stomach cancer Neg Hx    I have reviewed his medical, social, and family history in detail and updated the electronic medical record as necessary.    PHYSICAL EXAMINATION  BP 106/80   Pulse (!) 53   Ht 5\' 10"  (1.778 m)   Wt 183 lb (83 kg)   BMI 26.26 kg/m  GEN: NAD, appears stated age, doesn't appear chronically ill PSYCH: Cooperative, without pressured speech EYE: Conjunctivae pink, sclerae anicteric ENT: MMM CV: Nontachycardic RESP: No audible wheezing  GI: NABS, soft, NT/ND, without rebound MSK/EXT: No lower extremity edema SKIN: No jaundice NEURO:  Alert & Oriented x 3, no focal deficits,   REVIEW OF DATA  I reviewed the following data at the time of this encounter:  GI Procedures and Studies  No new studies  Laboratory Studies  Reviewed in epic  Imaging Studies  May 2024 MRI/MRCP IMPRESSION: 1. A previously seen cystic lesion of the pancreatic tail is substantially involuted, now measuring 2.3 x 1.3 cm, previously 3.9 x 3.7 cm. Findings are most consistent with a resolving, or perhaps procedure early drained pancreatic pseudocyst. 2. Unchanged, severe atrophy of the pancreas, with normal pancreatic head parenchyma, essentially no remnant pancreatic  body, and a small portion of pancreatic tail near the splenic hilum. 3. No acute inflammatory findings. 4. Status post left nephrectomy.   ASSESSMENT  Paul [name] is a 42 y.o. 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  The patient is seen today for evaluation and management of:  1. History of pancreatitis   2. Pancreatic cyst   3. Colon cancer screening    The patient is hemodynamically and clinically stable.  He continues to do well.  He still has evidence of a chronic pseudocyst, but this looks to be smaller than prior.  At this point we will plan to repeat imaging in 2 years.  Although he has evidence of an atrophic pancreas and previous EPI based on fecal elastase testing, he is not having significant exocrine insufficiency at this time.  He will remain off of PERT.  Certainly if other things develop, he will need to let us know in case he needs to restart this.  Will plan to repeat imaging in 2 years since things have been stable overall.  When I see him in a couple of years, we will then decide on his colon cancer screening which should begin at age 8.  All patient questions were answered to the best of my ability, and the patient agrees to the aforementioned plan of action with follow-up as indicated.   PLAN  Tentative MRI/MRCP in 2026 for follow-up of pancreatic chronic pseudocyst Holding on restarting PERT therapy as he has been doing well Continue healthy low-fat diet Colonoscopy at age 44   No orders of the defined types were placed in this encounter.    New Prescriptions   No medications on file   Modified Medications   No medications on file    Planned Follow Up: No follow-ups on file.   Total Time in Face-to-Face and in Coordination of Care for patient including  independent/personal interpretation/review of prior testing, medical history, examination, medication adjustment, communicating results with the patient directly, and documentation within the EHR is 25 minutes.  Corliss Parish, MD [facility] Office # [phone]

## 2022-10-14 ENCOUNTER — Other Ambulatory Visit: Payer: Self-pay

## 2022-10-14 ENCOUNTER — Ambulatory Visit (HOSPITAL_COMMUNITY)
Admission: EM | Admit: 2022-10-14 | Discharge: 2022-10-14 | Disposition: A | Discharge status: Home / Self Care | Payer: BC Managed Care – PPO | Attending: Family Medicine | Admitting: Family Medicine

## 2022-10-14 ENCOUNTER — Encounter (HOSPITAL_COMMUNITY): Payer: Self-pay | Admitting: Emergency Medicine

## 2022-10-14 DIAGNOSIS — R072 Precordial pain: Secondary | ICD-10-CM

## 2022-10-14 MED ORDER — OMEPRAZOLE 40 MG PO CPDR: 40.0000 mg | DELAYED_RELEASE_CAPSULE | Freq: Every day | ORAL | 0 refills | Status: AC

## 2022-10-14 NOTE — ED Triage Notes (Signed)
Pt c/o left chest and left arm pain that is intermittent for over a week. Reports sometimes when passes gas it goes away.   Pt reports had symptoms before few years back and was told has vitamin deficit.

## 2022-10-14 NOTE — ED Provider Notes (Signed)
[facility]    CSN: [id number] Arrival date & time: 10/14/22  1926      History   Chief Complaint Chief Complaint  Patient presents with   Chest Pain    HPI Paul [name] is a 42 y.o. male.    Chest Pain Here for intermittent left upper chest pain that is been bothering him the last 2 or 3 weeks.  He can last up to an hour and a half.  He has not really related it to any foods.  It is not exertional.  But, burping or passing gas can sometimes help it.  He does have a history of type 1 diabetes  He wonders about being vitamin deficient as he is remembering that maybe he had similar symptoms when his vitamins were low.  Of note he had a borderline low vitamin D in May of this year at 54  Past Medical History:  Diagnosis Date   Cancer of kidney [facility]) 2011   "left"   Diabetes mellitus without complication (HCC)    Elevated blood uric acid level    "I take RX for it; Allopurinol" (11/04/2017)   GERD (gastroesophageal reflux disease)    High cholesterol    Hypertension    Migraine    "only in my teens" (11/04/2017)   Pancreatitis    Pneumonia     Patient Active Problem List   Diagnosis Date Noted   Colon cancer screening 09/13/2022   Pancreatic cyst 07/22/2021   Exocrine pancreatic insufficiency 11/28/2018   Bloating 11/28/2018   Pancreatic pseudocyst 10/09/2018   Atrophic pancreas 10/09/2018   History of pancreatitis 02/21/2018   Anemia 02/21/2018   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/21/2018   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   Abnormal CT of the abdomen 01/11/2018   Pancreatic necrosis    Hyperglycemia 12/09/2017   Pancreatic abscess    Necrotizing pancreatitis 12/08/2017   Abnormal LFTs    Acute pancreatitis 11/04/2017   ARF (acute renal failure) (HCC) 11/04/2017   Essential hypertension 11/04/2017    Past Surgical History:  Procedure Laterality Date   ADRENALECTOMY     left side   BIOPSY  12/11/2017   Procedure: BIOPSY;  Surgeon: Lemar Lofty., MD;   Location: [facility] ENDOSCOPY;  Service: Gastroenterology;;   ESOPHAGOGASTRODUODENOSCOPY (EGD) WITH PROPOFOL N/A 12/11/2017   Procedure: ESOPHAGOGASTRODUODENOSCOPY (EGD) WITH PROPOFOL;  Surgeon: Lemar Lofty., MD;  Location: [facility] ENDOSCOPY;  Service: Gastroenterology;  Laterality: N/A;   ESOPHAGOGASTRODUODENOSCOPY (EGD) WITH PROPOFOL N/A 12/30/2017   Procedure: ESOPHAGOGASTRODUODENOSCOPY (EGD) WITH PROPOFOL Necrosectomy;  Surgeon: Meridee Score Netty Starring., MD;  Location: [facility] ENDOSCOPY;  Service: Gastroenterology;  Laterality: N/A;   IR CATHETER TUBE CHANGE  12/30/2017   IR RADIOLOGIST EVAL & MGMT  12/23/2017   IR RADIOLOGIST EVAL & MGMT  01/12/2018   IR RADIOLOGIST EVAL & MGMT  01/26/2018   NEPHRECTOMY Left 2011   "kidney cancer"   UPPER ESOPHAGEAL ENDOSCOPIC ULTRASOUND (EUS) N/A 12/11/2017   Procedure: UPPER ESOPHAGEAL ENDOSCOPIC ULTRASOUND (EUS);  Surgeon: Lemar Lofty., MD;  Location: [facility] ENDOSCOPY;  Service: Gastroenterology;  Laterality: N/A;   UPPER GASTROINTESTINAL ENDOSCOPY  02/2017       Home Medications    Prior to Admission medications   Medication Sig Start Date End Date Taking? Authorizing Provider  omeprazole (PRILOSEC) 40 MG capsule Take 1 capsule (40 mg total) by mouth daily. 10/14/22  Yes Zenia Resides, MD  allopurinol (ZYLOPRIM) 100 MG tablet Take 100 mg by mouth 2 (two) times daily. 10/07/17  [provider]  amLODipine (NORVASC) 5 MG tablet Take 5 mg by mouth at bedtime.    [provider]  atenolol (TENORMIN) 50 MG tablet Take 50 mg by mouth daily.    [provider]  blood glucose meter kit and supplies Dispense based on patient and insurance preference. Use up to four times daily as directed. (FOR ICD-10 E10.9, E11.9). 11/18/17   Albertine Grates, MD  CREON [phone] units CPEP capsule TAKE 2 CAPSULES WITH EACH MEAL. TAKE 1 CAPSULE WITH EACH SNACK.( UP TO 2 SNACKS DAILY.) Patient not taking: Reported on 09/09/2022 07/27/20   Mansouraty,  Netty Starring., MD  insulin aspart (NOVOLOG) 100 UNIT/ML FlexPen Before each meal 3 times a day, 140-199 - 2 units, 200-250 - 4 units, 251-299 - 6 units,  300-349 - 8 units,  350 or above 10 units. Insulin PEN if approved, provide syringes and needles if needed. Patient taking differently: Inject 2-4 Units into the skin 3 (three) times daily before meals. 11/18/17   Albertine Grates, MD  Insulin Pen Needle 31G X 5 MM MISC For insulin injection, please provide a month supply. 11/18/17   Albertine Grates, MD  LEVEMIR FLEXTOUCH 100 UNIT/ML Pen Inject 8 Units into the skin at bedtime. Patient taking differently: Inject 24 Units into the skin at bedtime. 12/17/17   Zannie Cove, MD  Vitamin D, Ergocalciferol, (DRISDOL) 1.25 MG (50000 UNIT) CAPS capsule Take 50,000 Units by mouth once a week. 03/12/20   [provider]    Family History Family History  Problem Relation Age of Onset   Hypertension Mother    Hypertension Father    Pancreatitis Paternal Uncle        twice, was a drinker   Colon cancer Neg Hx    Esophageal cancer Neg Hx    Inflammatory bowel disease Neg Hx    Liver disease Neg Hx    Pancreatic cancer Neg Hx    Rectal cancer Neg Hx    Stomach cancer Neg Hx     Social History Social History   Tobacco Use   Smoking status: Former    Current packs/day: 0.00    Average packs/day: 0.5 packs/day for 11.0 years (5.5 ttl pk-yrs)    Types: Cigarettes    Start date: 2000    Quit date: 2011    Years since quitting: 13.5   Smokeless tobacco: Never  Vaping Use   Vaping status: Never Used  Substance Use Topics   Alcohol use: Yes    Alcohol/week: 4.0 standard drinks of alcohol    Types: 4 Cans of beer per week    Comment: Occ   Drug use: Never     Allergies   Honey and Other   Review of Systems Review of Systems  Cardiovascular:  Positive for chest pain.     Physical Exam Triage Vital Signs ED Triage Vitals  Encounter Vitals Group     BP 10/14/22 1951 131/86     Systolic  BP Percentile --      Diastolic BP Percentile --      Pulse Rate 10/14/22 1951 66     Resp 10/14/22 1951 17     Temp 10/14/22 1951 97.8 F (36.6 C)     Temp src --      SpO2 10/14/22 1951 97 %     Weight --      Height --      Head Circumference --      Peak Flow --  Pain Score 10/14/22 1949 5     Pain Loc --      Pain Education --      Exclude from Growth Chart --    No data found.  Updated Vital Signs BP 131/86 (BP Location: Right Arm)   Pulse 66   Temp 97.8 F (36.6 C)   Resp 17   SpO2 97%   Visual Acuity Right Eye Distance:   Left Eye Distance:   Bilateral Distance:    Right Eye Near:   Left Eye Near:    Bilateral Near:     Physical Exam Vitals reviewed.  Constitutional:      General: He is not in acute distress.    Appearance: He is not ill-appearing, toxic-appearing or diaphoretic.  HENT:     Mouth/Throat:     Mouth: Mucous membranes are moist.  Eyes:     Extraocular Movements: Extraocular movements intact.     Conjunctiva/sclera: Conjunctivae normal.     Pupils: Pupils are equal, round, and reactive to light.  Cardiovascular:     Rate and Rhythm: Normal rate and regular rhythm.     Heart sounds: No murmur heard. Pulmonary:     Effort: Pulmonary effort is normal. No respiratory distress.     Breath sounds: Normal breath sounds. No stridor. No wheezing, rhonchi or rales.  Chest:     Chest wall: No tenderness.  Musculoskeletal:     Cervical back: Neck supple.  Lymphadenopathy:     Cervical: No cervical adenopathy.  Skin:    Coloration: Skin is not jaundiced or pale.  Neurological:     General: No focal deficit present.     Mental Status: He is alert and oriented to person, place, and time.  Psychiatric:        Behavior: Behavior normal.      UC Treatments / Results  Labs (all labs ordered are listed, but only abnormal results are displayed) Labs Reviewed - No data to display  EKG   Radiology No results  found.  Procedures Procedures (including critical care time)  Medications Ordered in UC Medications - No data to display  Initial Impression / Assessment and Plan / UC Course  I have reviewed the triage vital signs and the nursing notes.  Pertinent labs & imaging results that were available during my care of the patient were reviewed by me and considered in my medical decision making (see chart for details).    EKG shows normal sinus rhythm without any ST segment changes.  Since he has a primary can see him in a reasonable amount of time, I am deferring on drawing any labs for vitamin deficiencies, since diet do not see how that might be related at this time.  Since passing gas seems to help with some, omeprazole is sent in to see if that will help any.  He will follow-up with primary care  Final Clinical Impressions(s) / UC Diagnoses   Final diagnoses:  Precordial pain     Discharge Instructions      Take omeprazole 40 mg--1 capsule daily for stomach acid.  Please follow-up with your primary care     ED Prescriptions     Medication Sig Dispense Auth. Provider   omeprazole (PRILOSEC) 40 MG capsule Take 1 capsule (40 mg total) by mouth daily. 30 capsule Zenia Resides, MD      PDMP not reviewed this encounter.   Zenia Resides, MD 10/14/22 2007

## 2022-10-14 NOTE — Discharge Instructions (Signed)
Take omeprazole 40 mg--1 capsule daily for stomach acid.  Please follow-up with your primary care

## 2022-11-12 ENCOUNTER — Encounter: Payer: Self-pay | Admitting: Gastroenterology

## 2022-11-12 ENCOUNTER — Other Ambulatory Visit: Payer: Self-pay | Admitting: *Deleted

## 2022-11-12 ENCOUNTER — Telehealth: Payer: Self-pay | Admitting: *Deleted

## 2022-11-12 DIAGNOSIS — K8020 Calculus of gallbladder without cholecystitis without obstruction: Secondary | ICD-10-CM

## 2022-11-12 NOTE — Telephone Encounter (Signed)
Ordered ABD [place] per Dr.Mansouraty.

## 2023-02-09 ENCOUNTER — Ambulatory Visit (HOSPITAL_COMMUNITY)
Admission: RE | Admit: 2023-02-09 | Discharge: 2023-02-09 | Disposition: A | Discharge status: Home / Self Care | Payer: BC Managed Care – PPO | Source: Ambulatory Visit | Attending: Gastroenterology | Admitting: Gastroenterology

## 2023-02-09 DIAGNOSIS — K8020 Calculus of gallbladder without cholecystitis without obstruction: Secondary | ICD-10-CM | POA: Insufficient documentation

## 2023-02-10 ENCOUNTER — Telehealth: Payer: Self-pay | Admitting: *Deleted

## 2023-02-10 ENCOUNTER — Other Ambulatory Visit: Payer: Self-pay | Admitting: *Deleted

## 2023-02-10 NOTE — Telephone Encounter (Signed)
-----   Message from [facility] sent at 02/10/2023  5:45 AM EST ----- JB, Please reach out to the patient and let him know that his gallbladder ultrasound now shows more than just sludge but gallstones as well. Years ago we had theorized that he may have had some sludge or debris that may have led to some risk for his pancreatitis. If he is interested in meeting a surgeon to discuss gallbladder resection we can set that up if he likes. Let me know what he decides. Thanks. GM

## 2023-02-10 NOTE — Telephone Encounter (Signed)
Spoke to patient to inform of Dr. Elesa Hacker recommendations. Faxed patient's chart information to [facility] to discuss gallbladder resection as per orders. Fax number is [phone].

## 2023-02-12 ENCOUNTER — Inpatient Hospital Stay (HOSPITAL_COMMUNITY): Admission: RE | Admit: 2023-02-12 | Payer: BC Managed Care – PPO | Source: Ambulatory Visit

## 2023-02-14 ENCOUNTER — Encounter: Payer: Self-pay | Admitting: Gastroenterology

## 2023-03-02 ENCOUNTER — Other Ambulatory Visit: Payer: Self-pay | Admitting: General Surgery

## 2023-03-03 LAB — SURGICAL PATHOLOGY
